# Patient Record
Sex: Female | Born: 1980
Health system: Southern US, Community
[De-identification: ages and names within clinical notes are randomized; demographics above are authoritative.]

## PROBLEM LIST (undated history)

## (undated) ENCOUNTER — Inpatient Hospital Stay (HOSPITAL_COMMUNITY): Payer: Self-pay

## (undated) DIAGNOSIS — O24419 Gestational diabetes mellitus in pregnancy, unspecified control: Secondary | ICD-10-CM

## (undated) DIAGNOSIS — J45909 Unspecified asthma, uncomplicated: Secondary | ICD-10-CM

## (undated) DIAGNOSIS — K219 Gastro-esophageal reflux disease without esophagitis: Secondary | ICD-10-CM

## (undated) HISTORY — PX: DILATION AND CURETTAGE OF UTERUS: SHX78

## (undated) HISTORY — PX: TONSILLECTOMY: SUR1361

---

## 2010-02-27 ENCOUNTER — Ambulatory Visit: Payer: Self-pay | Admitting: Internal Medicine

## 2010-03-07 ENCOUNTER — Emergency Department (HOSPITAL_COMMUNITY): Admission: EM | Admit: 2010-03-07 | Discharge: 2010-03-07 | Payer: Self-pay | Admitting: Emergency Medicine

## 2010-03-08 ENCOUNTER — Ambulatory Visit: Payer: Self-pay | Admitting: Obstetrics and Gynecology

## 2010-03-08 ENCOUNTER — Inpatient Hospital Stay (HOSPITAL_COMMUNITY): Admission: AD | Admit: 2010-03-08 | Discharge: 2010-03-08 | Payer: Self-pay | Admitting: Obstetrics & Gynecology

## 2010-03-11 ENCOUNTER — Inpatient Hospital Stay (HOSPITAL_COMMUNITY): Admission: AD | Admit: 2010-03-11 | Discharge: 2010-03-12 | Payer: Self-pay | Admitting: Obstetrics & Gynecology

## 2010-04-20 ENCOUNTER — Ambulatory Visit: Payer: Self-pay | Admitting: Obstetrics and Gynecology

## 2010-04-20 ENCOUNTER — Encounter (INDEPENDENT_AMBULATORY_CARE_PROVIDER_SITE_OTHER): Payer: Self-pay | Admitting: *Deleted

## 2010-04-20 LAB — CONVERTED CEMR LAB: hCG, Beta Chain, Quant, S: <2 milliintl units/mL

## 2010-06-13 ENCOUNTER — Emergency Department (HOSPITAL_COMMUNITY): Admission: EM | Admit: 2010-06-13 | Discharge: 2010-06-13 | Payer: Self-pay | Admitting: Family Medicine

## 2010-08-29 ENCOUNTER — Inpatient Hospital Stay (HOSPITAL_COMMUNITY)
Admission: AD | Admit: 2010-08-29 | Discharge: 2010-08-29 | Payer: Self-pay | Source: Home / Self Care | Attending: Obstetrics & Gynecology | Admitting: Obstetrics & Gynecology

## 2010-08-31 ENCOUNTER — Ambulatory Visit (HOSPITAL_COMMUNITY)
Admission: RE | Admit: 2010-08-31 | Discharge: 2010-08-31 | Payer: Self-pay | Source: Home / Self Care | Attending: Obstetrics & Gynecology | Admitting: Obstetrics & Gynecology

## 2010-09-07 ENCOUNTER — Inpatient Hospital Stay (HOSPITAL_COMMUNITY)
Admission: RE | Admit: 2010-09-07 | Discharge: 2010-09-07 | Payer: Self-pay | Source: Home / Self Care | Attending: Family Medicine | Admitting: Family Medicine

## 2010-09-20 ENCOUNTER — Ambulatory Visit (HOSPITAL_COMMUNITY)
Admission: RE | Admit: 2010-09-20 | Discharge: 2010-09-20 | Payer: Self-pay | Source: Home / Self Care | Attending: Family Medicine | Admitting: Family Medicine

## 2010-10-06 ENCOUNTER — Inpatient Hospital Stay (HOSPITAL_COMMUNITY)
Admission: AD | Admit: 2010-10-06 | Discharge: 2010-10-06 | Payer: Self-pay | Source: Home / Self Care | Attending: Obstetrics & Gynecology | Admitting: Obstetrics & Gynecology

## 2010-10-09 LAB — URINE CULTURE: Culture  Setup Time: 201201212032

## 2010-10-09 LAB — URINALYSIS, ROUTINE W REFLEX MICROSCOPIC
Bilirubin Urine: NEGATIVE
Specific Gravity, Urine: 1.025 (ref 1.005–1.030)
pH: 6.5 (ref 5.0–8.0)

## 2010-10-09 LAB — URINE MICROSCOPIC-ADD ON

## 2010-10-22 ENCOUNTER — Inpatient Hospital Stay (HOSPITAL_COMMUNITY)
Admission: AD | Admit: 2010-10-22 | Discharge: 2010-10-22 | Disposition: A | Discharge status: Home / Self Care | Payer: Medicaid Other | Source: Ambulatory Visit | Attending: Obstetrics & Gynecology | Admitting: Obstetrics & Gynecology

## 2010-10-22 DIAGNOSIS — O30009 Twin pregnancy, unspecified number of placenta and unspecified number of amniotic sacs, unspecified trimester: Secondary | ICD-10-CM | POA: Insufficient documentation

## 2010-10-22 DIAGNOSIS — O210 Mild hyperemesis gravidarum: Secondary | ICD-10-CM

## 2010-10-22 LAB — URINE MICROSCOPIC-ADD ON

## 2010-10-22 LAB — URINALYSIS, ROUTINE W REFLEX MICROSCOPIC
Bilirubin Urine: NEGATIVE
Nitrite: NEGATIVE
Specific Gravity, Urine: 1.02 (ref 1.005–1.030)
pH: 6.5 (ref 5.0–8.0)

## 2010-10-30 ENCOUNTER — Encounter: Payer: Self-pay | Admitting: Obstetrics & Gynecology

## 2010-10-30 ENCOUNTER — Ambulatory Visit: Payer: Medicaid Other

## 2010-10-30 DIAGNOSIS — O210 Mild hyperemesis gravidarum: Secondary | ICD-10-CM

## 2010-10-30 DIAGNOSIS — O30009 Twin pregnancy, unspecified number of placenta and unspecified number of amniotic sacs, unspecified trimester: Secondary | ICD-10-CM

## 2010-10-30 LAB — CONVERTED CEMR LAB
Antibody Screen: NEGATIVE
Basophils Relative: 0 % (ref 0–1)
Eosinophils Absolute: 0.1 10*3/uL (ref 0.0–0.7)
Eosinophils Relative: 2 % (ref 0–5)
HCT: 31.3 % — ABNORMAL LOW (ref 36.0–46.0)
HIV: NONREACTIVE
Hemoglobin: 10.7 g/dL — ABNORMAL LOW (ref 12.0–15.0)
Hgb A2 Quant: 2.8 % (ref 2.2–3.2)
Hgb F Quant: 0 % (ref 0.0–2.0)
Lymphs Abs: 1.2 10*3/uL (ref 0.7–4.0)
MCHC: 34.2 g/dL (ref 30.0–36.0)
MCV: 89.9 fL (ref 78.0–100.0)
Monocytes Absolute: 0.4 10*3/uL (ref 0.1–1.0)
Monocytes Relative: 7 % (ref 3–12)
RBC: 3.48 M/uL — ABNORMAL LOW (ref 3.87–5.11)
Rh Type: POSITIVE
WBC: 5.3 10*3/uL (ref 4.0–10.5)

## 2010-10-31 ENCOUNTER — Encounter: Payer: Self-pay | Admitting: Obstetrics and Gynecology

## 2010-10-31 ENCOUNTER — Other Ambulatory Visit: Payer: Self-pay

## 2010-10-31 ENCOUNTER — Other Ambulatory Visit: Payer: Self-pay | Admitting: Obstetrics and Gynecology

## 2010-10-31 DIAGNOSIS — O210 Mild hyperemesis gravidarum: Secondary | ICD-10-CM

## 2010-10-31 DIAGNOSIS — O30009 Twin pregnancy, unspecified number of placenta and unspecified number of amniotic sacs, unspecified trimester: Secondary | ICD-10-CM

## 2010-10-31 LAB — POCT URINALYSIS DIPSTICK
Nitrite: NEGATIVE
Protein, ur: 30 mg/dL — AB
Specific Gravity, Urine: 1.02 (ref 1.005–1.030)
Urine Glucose, Fasting: NEGATIVE mg/dL
Urobilinogen, UA: 0.2 mg/dL (ref 0.0–1.0)
pH: 6.5 (ref 5.0–8.0)

## 2010-10-31 LAB — CONVERTED CEMR LAB: Trich, Wet Prep: NONE SEEN

## 2010-11-06 ENCOUNTER — Ambulatory Visit: Payer: Medicaid Other

## 2010-11-07 ENCOUNTER — Ambulatory Visit: Payer: Medicaid Other

## 2010-11-07 ENCOUNTER — Encounter: Payer: Self-pay | Admitting: Obstetrics & Gynecology

## 2010-11-07 DIAGNOSIS — Z0189 Encounter for other specified special examinations: Secondary | ICD-10-CM

## 2010-11-15 ENCOUNTER — Other Ambulatory Visit: Payer: Self-pay

## 2010-11-15 DIAGNOSIS — O30009 Twin pregnancy, unspecified number of placenta and unspecified number of amniotic sacs, unspecified trimester: Secondary | ICD-10-CM

## 2010-11-15 DIAGNOSIS — O210 Mild hyperemesis gravidarum: Secondary | ICD-10-CM

## 2010-11-15 LAB — POCT URINALYSIS DIPSTICK
Nitrite: NEGATIVE
Protein, ur: NEGATIVE mg/dL
pH: 6 (ref 5.0–8.0)

## 2010-11-16 ENCOUNTER — Encounter (INDEPENDENT_AMBULATORY_CARE_PROVIDER_SITE_OTHER): Payer: Self-pay | Admitting: *Deleted

## 2010-11-27 LAB — URINALYSIS, ROUTINE W REFLEX MICROSCOPIC
Glucose, UA: NEGATIVE mg/dL
Ketones, ur: NEGATIVE mg/dL
Leukocytes, UA: NEGATIVE
pH: 5.5 (ref 5.0–8.0)

## 2010-11-27 LAB — URINE MICROSCOPIC-ADD ON

## 2010-11-27 LAB — POCT PREGNANCY, URINE: Preg Test, Ur: POSITIVE

## 2010-11-27 LAB — WET PREP, GENITAL
Trich, Wet Prep: NONE SEEN
Yeast Wet Prep HPF POC: NONE SEEN

## 2010-11-27 LAB — ABO/RH: ABO/RH(D): B POS

## 2010-11-27 LAB — CBC
Platelets: 231 10*3/uL (ref 150–400)
RBC: 3.72 MIL/uL — ABNORMAL LOW (ref 3.87–5.11)
RDW: 12.8 % (ref 11.5–15.5)
WBC: 6.4 10*3/uL (ref 4.0–10.5)

## 2010-11-27 LAB — GC/CHLAMYDIA PROBE AMP, GENITAL: GC Probe Amp, Genital: NEGATIVE

## 2010-11-29 ENCOUNTER — Encounter: Payer: Self-pay | Admitting: *Deleted

## 2010-11-29 ENCOUNTER — Other Ambulatory Visit: Payer: Self-pay

## 2010-11-29 ENCOUNTER — Other Ambulatory Visit: Payer: Self-pay | Admitting: Obstetrics and Gynecology

## 2010-11-29 DIAGNOSIS — O210 Mild hyperemesis gravidarum: Secondary | ICD-10-CM

## 2010-11-29 DIAGNOSIS — IMO0001 Reserved for inherently not codable concepts without codable children: Secondary | ICD-10-CM

## 2010-11-29 DIAGNOSIS — O30009 Twin pregnancy, unspecified number of placenta and unspecified number of amniotic sacs, unspecified trimester: Secondary | ICD-10-CM

## 2010-11-29 DIAGNOSIS — O24419 Gestational diabetes mellitus in pregnancy, unspecified control: Secondary | ICD-10-CM

## 2010-11-29 LAB — POCT URINALYSIS DIPSTICK
Ketones, ur: NEGATIVE mg/dL
Protein, ur: NEGATIVE mg/dL
pH: 6 (ref 5.0–8.0)

## 2010-12-02 LAB — GC/CHLAMYDIA PROBE AMP, GENITAL
Chlamydia, DNA Probe: NEGATIVE
GC Probe Amp, Genital: NEGATIVE

## 2010-12-02 LAB — CBC
HCT: 39.8 % (ref 36.0–46.0)
Hemoglobin: 11.2 g/dL — ABNORMAL LOW (ref 12.0–15.0)
Hemoglobin: 13.5 g/dL (ref 12.0–15.0)
MCH: 32.8 pg (ref 26.0–34.0)
MCH: 33.2 pg (ref 26.0–34.0)
MCHC: 34.8 g/dL (ref 30.0–36.0)
MCHC: 35.4 g/dL (ref 30.0–36.0)
MCV: 93.9 fL (ref 78.0–100.0)
MCV: 94.6 fL (ref 78.0–100.0)
Platelets: 213 10*3/uL (ref 150–400)
RBC: 4.01 MIL/uL (ref 3.87–5.11)
RBC: 4.21 MIL/uL (ref 3.87–5.11)
RDW: 12.5 % (ref 11.5–15.5)
RDW: 12.6 % (ref 11.5–15.5)
WBC: 7.4 10*3/uL (ref 4.0–10.5)

## 2010-12-02 LAB — URINALYSIS, ROUTINE W REFLEX MICROSCOPIC
Bilirubin Urine: NEGATIVE
Glucose, UA: NEGATIVE mg/dL
Specific Gravity, Urine: 1.008 (ref 1.005–1.030)
Urobilinogen, UA: 0.2 mg/dL (ref 0.0–1.0)

## 2010-12-02 LAB — DIFFERENTIAL
Basophils Absolute: 0 10*3/uL (ref 0.0–0.1)
Eosinophils Relative: 3 % (ref 0–5)
Lymphocytes Relative: 34 % (ref 12–46)
Lymphs Abs: 2.5 10*3/uL (ref 0.7–4.0)
Monocytes Absolute: 0.4 10*3/uL (ref 0.1–1.0)
Monocytes Relative: 6 % (ref 3–12)
Neutro Abs: 4.2 10*3/uL (ref 1.7–7.7)

## 2010-12-02 LAB — RPR: RPR Ser Ql: NONREACTIVE

## 2010-12-02 LAB — WET PREP, GENITAL
Trich, Wet Prep: NONE SEEN
Yeast Wet Prep HPF POC: NONE SEEN

## 2010-12-02 LAB — URINE MICROSCOPIC-ADD ON

## 2010-12-02 LAB — HCG, QUANTITATIVE, PREGNANCY: hCG, Beta Chain, Quant, S: 3713 m[IU]/mL — ABNORMAL HIGH (ref <5)

## 2010-12-11 ENCOUNTER — Other Ambulatory Visit: Payer: Self-pay | Admitting: Obstetrics and Gynecology

## 2010-12-11 DIAGNOSIS — IMO0001 Reserved for inherently not codable concepts without codable children: Secondary | ICD-10-CM

## 2010-12-11 DIAGNOSIS — O210 Mild hyperemesis gravidarum: Secondary | ICD-10-CM

## 2010-12-11 DIAGNOSIS — O24419 Gestational diabetes mellitus in pregnancy, unspecified control: Secondary | ICD-10-CM

## 2010-12-13 ENCOUNTER — Encounter (HOSPITAL_COMMUNITY): Payer: Self-pay

## 2010-12-13 ENCOUNTER — Ambulatory Visit (HOSPITAL_COMMUNITY)
Admission: RE | Admit: 2010-12-13 | Discharge: 2010-12-13 | Disposition: A | Discharge status: Home / Self Care | Payer: Medicaid Other | Source: Ambulatory Visit | Attending: Obstetrics and Gynecology | Admitting: Obstetrics and Gynecology

## 2010-12-13 DIAGNOSIS — O210 Mild hyperemesis gravidarum: Secondary | ICD-10-CM

## 2010-12-13 DIAGNOSIS — O30009 Twin pregnancy, unspecified number of placenta and unspecified number of amniotic sacs, unspecified trimester: Secondary | ICD-10-CM | POA: Insufficient documentation

## 2010-12-13 DIAGNOSIS — O358XX0 Maternal care for other (suspected) fetal abnormality and damage, not applicable or unspecified: Secondary | ICD-10-CM | POA: Insufficient documentation

## 2010-12-13 DIAGNOSIS — IMO0001 Reserved for inherently not codable concepts without codable children: Secondary | ICD-10-CM

## 2010-12-13 DIAGNOSIS — Z363 Encounter for antenatal screening for malformations: Secondary | ICD-10-CM | POA: Insufficient documentation

## 2010-12-13 DIAGNOSIS — O24419 Gestational diabetes mellitus in pregnancy, unspecified control: Secondary | ICD-10-CM

## 2010-12-13 DIAGNOSIS — Z1389 Encounter for screening for other disorder: Secondary | ICD-10-CM | POA: Insufficient documentation

## 2010-12-20 ENCOUNTER — Other Ambulatory Visit: Payer: Self-pay | Admitting: Obstetrics & Gynecology

## 2010-12-20 DIAGNOSIS — O210 Mild hyperemesis gravidarum: Secondary | ICD-10-CM

## 2010-12-20 DIAGNOSIS — O30009 Twin pregnancy, unspecified number of placenta and unspecified number of amniotic sacs, unspecified trimester: Secondary | ICD-10-CM

## 2010-12-20 DIAGNOSIS — Z331 Pregnant state, incidental: Secondary | ICD-10-CM

## 2010-12-20 LAB — POCT URINALYSIS DIP (DEVICE)
Glucose, UA: NEGATIVE mg/dL
Nitrite: NEGATIVE
Urobilinogen, UA: 0.2 mg/dL (ref 0.0–1.0)
pH: 6.5 (ref 5.0–8.0)

## 2011-01-03 ENCOUNTER — Other Ambulatory Visit: Payer: Self-pay | Admitting: Obstetrics & Gynecology

## 2011-01-03 DIAGNOSIS — O30009 Twin pregnancy, unspecified number of placenta and unspecified number of amniotic sacs, unspecified trimester: Secondary | ICD-10-CM

## 2011-01-03 DIAGNOSIS — O210 Mild hyperemesis gravidarum: Secondary | ICD-10-CM

## 2011-01-03 DIAGNOSIS — Z331 Pregnant state, incidental: Secondary | ICD-10-CM

## 2011-01-03 LAB — POCT URINALYSIS DIP (DEVICE)
Nitrite: NEGATIVE
Protein, ur: NEGATIVE mg/dL
pH: 6 (ref 5.0–8.0)

## 2011-01-11 ENCOUNTER — Inpatient Hospital Stay (HOSPITAL_COMMUNITY)
Admission: AD | Admit: 2011-01-11 | Discharge: 2011-01-11 | Disposition: A | Discharge status: Home / Self Care | Payer: Medicaid Other | Source: Ambulatory Visit | Attending: Obstetrics and Gynecology | Admitting: Obstetrics and Gynecology

## 2011-01-11 ENCOUNTER — Inpatient Hospital Stay (HOSPITAL_COMMUNITY): Payer: Medicaid Other

## 2011-01-11 DIAGNOSIS — B373 Candidiasis of vulva and vagina: Secondary | ICD-10-CM

## 2011-01-11 DIAGNOSIS — B3731 Acute candidiasis of vulva and vagina: Secondary | ICD-10-CM | POA: Insufficient documentation

## 2011-01-11 DIAGNOSIS — O2390 Unspecified genitourinary tract infection in pregnancy, unspecified trimester: Secondary | ICD-10-CM

## 2011-01-11 DIAGNOSIS — R109 Unspecified abdominal pain: Secondary | ICD-10-CM | POA: Insufficient documentation

## 2011-01-11 DIAGNOSIS — K5900 Constipation, unspecified: Secondary | ICD-10-CM | POA: Insufficient documentation

## 2011-01-11 LAB — URINALYSIS, ROUTINE W REFLEX MICROSCOPIC
Bilirubin Urine: NEGATIVE
Ketones, ur: NEGATIVE mg/dL
Nitrite: NEGATIVE
Protein, ur: NEGATIVE mg/dL
Urobilinogen, UA: 1 mg/dL (ref 0.0–1.0)

## 2011-01-12 LAB — GC/CHLAMYDIA PROBE AMP, GENITAL: Chlamydia, DNA Probe: NEGATIVE

## 2011-01-13 LAB — URINE CULTURE
Colony Count: NO GROWTH
Culture: NO GROWTH

## 2011-01-17 ENCOUNTER — Ambulatory Visit (HOSPITAL_COMMUNITY)
Admission: RE | Admit: 2011-01-17 | Discharge: 2011-01-17 | Disposition: A | Discharge status: Home / Self Care | Payer: Medicaid Other | Source: Ambulatory Visit | Attending: Obstetrics & Gynecology | Admitting: Obstetrics & Gynecology

## 2011-01-17 ENCOUNTER — Ambulatory Visit (HOSPITAL_COMMUNITY): Payer: Medicaid Other

## 2011-01-17 ENCOUNTER — Other Ambulatory Visit: Payer: Self-pay | Admitting: Obstetrics and Gynecology

## 2011-01-17 DIAGNOSIS — O210 Mild hyperemesis gravidarum: Secondary | ICD-10-CM

## 2011-01-17 DIAGNOSIS — O30049 Twin pregnancy, dichorionic/diamniotic, unspecified trimester: Secondary | ICD-10-CM | POA: Insufficient documentation

## 2011-01-17 DIAGNOSIS — Z331 Pregnant state, incidental: Secondary | ICD-10-CM

## 2011-01-17 DIAGNOSIS — O30009 Twin pregnancy, unspecified number of placenta and unspecified number of amniotic sacs, unspecified trimester: Secondary | ICD-10-CM | POA: Insufficient documentation

## 2011-01-17 LAB — POCT URINALYSIS DIP (DEVICE)
Bilirubin Urine: NEGATIVE
Ketones, ur: NEGATIVE mg/dL
Nitrite: NEGATIVE
pH: 6 (ref 5.0–8.0)

## 2011-01-31 ENCOUNTER — Other Ambulatory Visit: Payer: Self-pay | Admitting: Obstetrics & Gynecology

## 2011-01-31 ENCOUNTER — Other Ambulatory Visit: Payer: Medicaid Other

## 2011-01-31 DIAGNOSIS — O210 Mild hyperemesis gravidarum: Secondary | ICD-10-CM

## 2011-01-31 DIAGNOSIS — O30009 Twin pregnancy, unspecified number of placenta and unspecified number of amniotic sacs, unspecified trimester: Secondary | ICD-10-CM

## 2011-01-31 DIAGNOSIS — Z331 Pregnant state, incidental: Secondary | ICD-10-CM

## 2011-01-31 DIAGNOSIS — IMO0001 Reserved for inherently not codable concepts without codable children: Secondary | ICD-10-CM

## 2011-01-31 LAB — POCT URINALYSIS DIP (DEVICE)
Glucose, UA: NEGATIVE mg/dL
Ketones, ur: NEGATIVE mg/dL
Protein, ur: NEGATIVE mg/dL
Specific Gravity, Urine: 1.02 (ref 1.005–1.030)
Urobilinogen, UA: 0.2 mg/dL (ref 0.0–1.0)

## 2011-02-05 DIAGNOSIS — Z0189 Encounter for other specified special examinations: Secondary | ICD-10-CM

## 2011-02-14 ENCOUNTER — Ambulatory Visit (HOSPITAL_COMMUNITY)
Admission: RE | Admit: 2011-02-14 | Discharge: 2011-02-14 | Disposition: A | Discharge status: Home / Self Care | Payer: Medicaid Other | Source: Ambulatory Visit | Attending: Obstetrics & Gynecology | Admitting: Obstetrics & Gynecology

## 2011-02-14 ENCOUNTER — Ambulatory Visit (HOSPITAL_COMMUNITY): Payer: Medicaid Other

## 2011-02-14 ENCOUNTER — Other Ambulatory Visit: Payer: Self-pay | Admitting: Obstetrics and Gynecology

## 2011-02-14 DIAGNOSIS — Z3689 Encounter for other specified antenatal screening: Secondary | ICD-10-CM | POA: Insufficient documentation

## 2011-02-14 DIAGNOSIS — O30009 Twin pregnancy, unspecified number of placenta and unspecified number of amniotic sacs, unspecified trimester: Secondary | ICD-10-CM

## 2011-02-14 DIAGNOSIS — IMO0001 Reserved for inherently not codable concepts without codable children: Secondary | ICD-10-CM

## 2011-02-14 DIAGNOSIS — O210 Mild hyperemesis gravidarum: Secondary | ICD-10-CM

## 2011-02-14 DIAGNOSIS — Z331 Pregnant state, incidental: Secondary | ICD-10-CM

## 2011-02-14 LAB — POCT URINALYSIS DIP (DEVICE)
Bilirubin Urine: NEGATIVE
Glucose, UA: NEGATIVE mg/dL
Hgb urine dipstick: NEGATIVE
Nitrite: NEGATIVE
Specific Gravity, Urine: 1.025 (ref 1.005–1.030)
Urobilinogen, UA: 2 mg/dL — ABNORMAL HIGH (ref 0.0–1.0)

## 2011-02-18 ENCOUNTER — Encounter: Payer: Medicaid Other | Attending: Obstetrics & Gynecology | Admitting: Dietician

## 2011-02-18 ENCOUNTER — Other Ambulatory Visit: Payer: Self-pay | Admitting: Obstetrics & Gynecology

## 2011-02-18 DIAGNOSIS — Z0189 Encounter for other specified special examinations: Secondary | ICD-10-CM

## 2011-02-18 LAB — POCT URINALYSIS DIP (DEVICE)
Bilirubin Urine: NEGATIVE
Glucose, UA: NEGATIVE mg/dL
Ketones, ur: NEGATIVE mg/dL
Nitrite: NEGATIVE

## 2011-02-25 ENCOUNTER — Other Ambulatory Visit: Payer: Self-pay | Admitting: Obstetrics & Gynecology

## 2011-02-25 DIAGNOSIS — O99810 Abnormal glucose complicating pregnancy: Secondary | ICD-10-CM

## 2011-02-25 DIAGNOSIS — O210 Mild hyperemesis gravidarum: Secondary | ICD-10-CM

## 2011-02-25 DIAGNOSIS — O30009 Twin pregnancy, unspecified number of placenta and unspecified number of amniotic sacs, unspecified trimester: Secondary | ICD-10-CM

## 2011-02-25 LAB — POCT URINALYSIS DIP (DEVICE)
Bilirubin Urine: NEGATIVE
Ketones, ur: NEGATIVE mg/dL
Specific Gravity, Urine: 1.015 (ref 1.005–1.030)

## 2011-03-11 ENCOUNTER — Other Ambulatory Visit: Payer: Self-pay | Admitting: Family Medicine

## 2011-03-11 DIAGNOSIS — O24919 Unspecified diabetes mellitus in pregnancy, unspecified trimester: Secondary | ICD-10-CM

## 2011-03-11 DIAGNOSIS — O99810 Abnormal glucose complicating pregnancy: Secondary | ICD-10-CM

## 2011-03-11 DIAGNOSIS — O30009 Twin pregnancy, unspecified number of placenta and unspecified number of amniotic sacs, unspecified trimester: Secondary | ICD-10-CM

## 2011-03-11 DIAGNOSIS — IMO0001 Reserved for inherently not codable concepts without codable children: Secondary | ICD-10-CM

## 2011-03-11 LAB — POCT URINALYSIS DIP (DEVICE)
Bilirubin Urine: NEGATIVE
Glucose, UA: NEGATIVE mg/dL
Hgb urine dipstick: NEGATIVE
Ketones, ur: NEGATIVE mg/dL
Specific Gravity, Urine: 1.015 (ref 1.005–1.030)
Urobilinogen, UA: 1 mg/dL (ref 0.0–1.0)

## 2011-03-18 ENCOUNTER — Other Ambulatory Visit: Payer: Self-pay | Admitting: Obstetrics and Gynecology

## 2011-03-18 ENCOUNTER — Ambulatory Visit (HOSPITAL_COMMUNITY)
Admission: RE | Admit: 2011-03-18 | Discharge: 2011-03-18 | Disposition: A | Discharge status: Home / Self Care | Payer: Medicaid Other | Source: Ambulatory Visit | Attending: Family Medicine | Admitting: Family Medicine

## 2011-03-18 ENCOUNTER — Ambulatory Visit (HOSPITAL_COMMUNITY): Payer: Medicaid Other

## 2011-03-18 DIAGNOSIS — IMO0001 Reserved for inherently not codable concepts without codable children: Secondary | ICD-10-CM

## 2011-03-18 DIAGNOSIS — O24919 Unspecified diabetes mellitus in pregnancy, unspecified trimester: Secondary | ICD-10-CM

## 2011-03-18 DIAGNOSIS — O210 Mild hyperemesis gravidarum: Secondary | ICD-10-CM

## 2011-03-18 DIAGNOSIS — O30009 Twin pregnancy, unspecified number of placenta and unspecified number of amniotic sacs, unspecified trimester: Secondary | ICD-10-CM | POA: Insufficient documentation

## 2011-03-18 DIAGNOSIS — O99810 Abnormal glucose complicating pregnancy: Secondary | ICD-10-CM | POA: Insufficient documentation

## 2011-03-18 LAB — POCT URINALYSIS DIP (DEVICE)
Bilirubin Urine: NEGATIVE
Glucose, UA: NEGATIVE mg/dL
Ketones, ur: NEGATIVE mg/dL
Nitrite: NEGATIVE
Specific Gravity, Urine: 1.02 (ref 1.005–1.030)
pH: 7 (ref 5.0–8.0)

## 2011-03-22 ENCOUNTER — Other Ambulatory Visit: Payer: Medicaid Other

## 2011-03-22 DIAGNOSIS — O30009 Twin pregnancy, unspecified number of placenta and unspecified number of amniotic sacs, unspecified trimester: Secondary | ICD-10-CM

## 2011-03-22 DIAGNOSIS — O99810 Abnormal glucose complicating pregnancy: Secondary | ICD-10-CM

## 2011-03-25 ENCOUNTER — Ambulatory Visit: Payer: Medicaid Other | Admitting: Family Medicine

## 2011-03-25 ENCOUNTER — Other Ambulatory Visit: Payer: Self-pay | Admitting: Obstetrics and Gynecology

## 2011-03-25 ENCOUNTER — Other Ambulatory Visit: Payer: Self-pay | Admitting: Obstetrics & Gynecology

## 2011-03-25 DIAGNOSIS — O30009 Twin pregnancy, unspecified number of placenta and unspecified number of amniotic sacs, unspecified trimester: Secondary | ICD-10-CM

## 2011-03-25 DIAGNOSIS — O24919 Unspecified diabetes mellitus in pregnancy, unspecified trimester: Secondary | ICD-10-CM

## 2011-03-25 DIAGNOSIS — O210 Mild hyperemesis gravidarum: Secondary | ICD-10-CM

## 2011-03-25 DIAGNOSIS — O24419 Gestational diabetes mellitus in pregnancy, unspecified control: Secondary | ICD-10-CM

## 2011-03-25 LAB — POCT URINALYSIS DIP (DEVICE)
Leukocytes, UA: NEGATIVE
Specific Gravity, Urine: 1.02 (ref 1.005–1.030)
pH: 6.5 (ref 5.0–8.0)

## 2011-03-28 ENCOUNTER — Telehealth: Payer: Self-pay | Admitting: Obstetrics & Gynecology

## 2011-03-28 LAB — CULTURE, OB URINE

## 2011-03-28 MED ORDER — NITROFURANTOIN MONOHYD MACRO 100 MG PO CAPS: 100.0000 mg | ORAL_CAPSULE | Freq: Two times a day (BID) | ORAL | Status: AC

## 2011-03-28 NOTE — Telephone Encounter (Signed)
Script printed.

## 2011-03-29 ENCOUNTER — Ambulatory Visit (INDEPENDENT_AMBULATORY_CARE_PROVIDER_SITE_OTHER): Payer: Medicaid Other | Admitting: Family Medicine

## 2011-03-29 DIAGNOSIS — O24919 Unspecified diabetes mellitus in pregnancy, unspecified trimester: Secondary | ICD-10-CM

## 2011-03-29 DIAGNOSIS — O99810 Abnormal glucose complicating pregnancy: Secondary | ICD-10-CM

## 2011-03-29 DIAGNOSIS — O30009 Twin pregnancy, unspecified number of placenta and unspecified number of amniotic sacs, unspecified trimester: Secondary | ICD-10-CM

## 2011-04-01 ENCOUNTER — Ambulatory Visit (INDEPENDENT_AMBULATORY_CARE_PROVIDER_SITE_OTHER): Payer: Medicaid Other | Admitting: Family Medicine

## 2011-04-01 DIAGNOSIS — O30009 Twin pregnancy, unspecified number of placenta and unspecified number of amniotic sacs, unspecified trimester: Secondary | ICD-10-CM

## 2011-04-01 DIAGNOSIS — O99810 Abnormal glucose complicating pregnancy: Secondary | ICD-10-CM

## 2011-04-01 LAB — POCT URINALYSIS DIP (DEVICE)
Bilirubin Urine: NEGATIVE
Glucose, UA: NEGATIVE mg/dL
Nitrite: NEGATIVE
Specific Gravity, Urine: 1.02 (ref 1.005–1.030)
Urobilinogen, UA: 0.2 mg/dL (ref 0.0–1.0)

## 2011-04-05 ENCOUNTER — Ambulatory Visit: Payer: Medicaid Other | Admitting: *Deleted

## 2011-04-05 DIAGNOSIS — O30009 Twin pregnancy, unspecified number of placenta and unspecified number of amniotic sacs, unspecified trimester: Secondary | ICD-10-CM

## 2011-04-08 ENCOUNTER — Other Ambulatory Visit: Payer: Self-pay | Admitting: Obstetrics and Gynecology

## 2011-04-08 ENCOUNTER — Ambulatory Visit (HOSPITAL_COMMUNITY)
Admission: RE | Admit: 2011-04-08 | Discharge: 2011-04-08 | Disposition: A | Discharge status: Home / Self Care | Payer: Medicaid Other | Source: Ambulatory Visit | Attending: Obstetrics and Gynecology | Admitting: Obstetrics and Gynecology

## 2011-04-08 ENCOUNTER — Ambulatory Visit: Payer: Medicaid Other | Admitting: *Deleted

## 2011-04-08 ENCOUNTER — Other Ambulatory Visit: Payer: Self-pay | Admitting: Obstetrics & Gynecology

## 2011-04-08 DIAGNOSIS — IMO0001 Reserved for inherently not codable concepts without codable children: Secondary | ICD-10-CM

## 2011-04-08 DIAGNOSIS — O288 Other abnormal findings on antenatal screening of mother: Secondary | ICD-10-CM

## 2011-04-08 DIAGNOSIS — O24419 Gestational diabetes mellitus in pregnancy, unspecified control: Secondary | ICD-10-CM

## 2011-04-08 DIAGNOSIS — O99810 Abnormal glucose complicating pregnancy: Secondary | ICD-10-CM

## 2011-04-08 DIAGNOSIS — O30009 Twin pregnancy, unspecified number of placenta and unspecified number of amniotic sacs, unspecified trimester: Secondary | ICD-10-CM | POA: Insufficient documentation

## 2011-04-08 LAB — POCT URINALYSIS DIP (DEVICE)
Bilirubin Urine: NEGATIVE
Glucose, UA: NEGATIVE mg/dL
Ketones, ur: NEGATIVE mg/dL
Nitrite: NEGATIVE

## 2011-04-09 LAB — GC/CHLAMYDIA PROBE AMP, GENITAL: Chlamydia, DNA Probe: NEGATIVE

## 2011-04-10 LAB — CULTURE, OB URINE
Colony Count: NO GROWTH
Organism ID, Bacteria: NO GROWTH

## 2011-04-12 ENCOUNTER — Encounter (HOSPITAL_COMMUNITY): Payer: Self-pay

## 2011-04-12 ENCOUNTER — Inpatient Hospital Stay (HOSPITAL_COMMUNITY)
Admission: AD | Admit: 2011-04-12 | Discharge: 2011-04-13 | Disposition: A | Discharge status: Home / Self Care | Payer: Medicaid Other | Source: Ambulatory Visit | Attending: Obstetrics & Gynecology | Admitting: Obstetrics & Gynecology

## 2011-04-12 ENCOUNTER — Ambulatory Visit: Payer: Medicaid Other | Admitting: *Deleted

## 2011-04-12 DIAGNOSIS — O30009 Twin pregnancy, unspecified number of placenta and unspecified number of amniotic sacs, unspecified trimester: Secondary | ICD-10-CM

## 2011-04-12 DIAGNOSIS — O479 False labor, unspecified: Secondary | ICD-10-CM | POA: Insufficient documentation

## 2011-04-12 DIAGNOSIS — O99810 Abnormal glucose complicating pregnancy: Secondary | ICD-10-CM

## 2011-04-12 MED ORDER — TERBUTALINE SULFATE 1 MG/ML IJ SOLN
0.2500 mg | Freq: Once | INTRAMUSCULAR | Status: AC
  Administered 2011-04-13: via SUBCUTANEOUS
  Filled 2011-04-12: qty 1

## 2011-04-12 NOTE — Progress Notes (Signed)
Pt states," I have a lot of pain in my low abd and it goes around to my back. It started at 2:00 pm and has gotten worse and now it is every 2-5 min."

## 2011-04-12 NOTE — Progress Notes (Signed)
Patient is here for labor eval. She states that she has been having painful ctx since 1400pm. She denies any vaginal bleeding or lof. Reports good fetal movement.

## 2011-04-15 ENCOUNTER — Ambulatory Visit: Payer: Medicaid Other | Admitting: *Deleted

## 2011-04-15 DIAGNOSIS — O30009 Twin pregnancy, unspecified number of placenta and unspecified number of amniotic sacs, unspecified trimester: Secondary | ICD-10-CM

## 2011-04-15 DIAGNOSIS — O99810 Abnormal glucose complicating pregnancy: Secondary | ICD-10-CM

## 2011-04-15 LAB — POCT URINALYSIS DIP (DEVICE)
Bilirubin Urine: NEGATIVE
Ketones, ur: NEGATIVE mg/dL
Protein, ur: NEGATIVE mg/dL
pH: 7 (ref 5.0–8.0)

## 2011-04-19 ENCOUNTER — Ambulatory Visit (EMERGENCY_DEPARTMENT_HOSPITAL): Payer: Medicaid Other | Admitting: *Deleted

## 2011-04-19 DIAGNOSIS — O210 Mild hyperemesis gravidarum: Secondary | ICD-10-CM

## 2011-04-19 DIAGNOSIS — O30009 Twin pregnancy, unspecified number of placenta and unspecified number of amniotic sacs, unspecified trimester: Secondary | ICD-10-CM

## 2011-04-19 DIAGNOSIS — O99810 Abnormal glucose complicating pregnancy: Secondary | ICD-10-CM

## 2011-04-19 NOTE — Progress Notes (Signed)
I have reviewed this category I tracing 

## 2011-04-22 ENCOUNTER — Encounter (HOSPITAL_COMMUNITY): Payer: Self-pay | Admitting: *Deleted

## 2011-04-22 ENCOUNTER — Ambulatory Visit (INDEPENDENT_AMBULATORY_CARE_PROVIDER_SITE_OTHER): Payer: Medicaid Other | Admitting: Family Medicine

## 2011-04-22 ENCOUNTER — Other Ambulatory Visit: Payer: Self-pay | Admitting: Obstetrics and Gynecology

## 2011-04-22 ENCOUNTER — Inpatient Hospital Stay (HOSPITAL_COMMUNITY)
Admission: AD | Admit: 2011-04-22 | Discharge: 2011-04-22 | Disposition: A | Discharge status: Home / Self Care | Payer: Medicaid Other | Source: Ambulatory Visit | Attending: Obstetrics & Gynecology | Admitting: Obstetrics & Gynecology

## 2011-04-22 DIAGNOSIS — O99810 Abnormal glucose complicating pregnancy: Secondary | ICD-10-CM

## 2011-04-22 DIAGNOSIS — O30009 Twin pregnancy, unspecified number of placenta and unspecified number of amniotic sacs, unspecified trimester: Secondary | ICD-10-CM | POA: Insufficient documentation

## 2011-04-22 DIAGNOSIS — O479 False labor, unspecified: Secondary | ICD-10-CM | POA: Insufficient documentation

## 2011-04-22 LAB — POCT URINALYSIS DIP (DEVICE)
Ketones, ur: NEGATIVE mg/dL
Protein, ur: NEGATIVE mg/dL
Specific Gravity, Urine: 1.02 (ref 1.005–1.030)
Urobilinogen, UA: 0.2 mg/dL (ref 0.0–1.0)
pH: 6.5 (ref 5.0–8.0)

## 2011-04-22 MED ORDER — OXYCODONE-ACETAMINOPHEN 5-325 MG PO TABS: 2.0000 | ORAL_TABLET | Freq: Once | ORAL | Status: DC

## 2011-04-22 MED ORDER — HYDROMORPHONE HCL 1 MG/ML IJ SOLN
1.0000 mg | Freq: Once | INTRAMUSCULAR | Status: AC
  Administered 2011-04-22: 1 mg via INTRAMUSCULAR
  Filled 2011-04-22: qty 1

## 2011-04-22 MED ORDER — OXYCODONE-ACETAMINOPHEN 5-325 MG PO TABS: 1.0000 | ORAL_TABLET | ORAL | Status: DC | PRN

## 2011-04-22 MED ORDER — PROMETHAZINE HCL 25 MG/ML IJ SOLN
12.5000 mg | Freq: Once | INTRAMUSCULAR | Status: AC
  Administered 2011-04-22: 12.5 mg via INTRAMUSCULAR
  Filled 2011-04-22: qty 1

## 2011-04-22 NOTE — ED Provider Notes (Signed)
Pt cx remains unchanged...1-2/50/-3.  Plan discussed with the patient with interpreter and agrees with d/c home.  C/S on Fri.

## 2011-04-22 NOTE — ED Notes (Signed)
Pt. very restless, difficult to monitor. Will not stay in position conducive to monitoring. Explained to pt. importance of monitoring especially after pain med.

## 2011-04-22 NOTE — Progress Notes (Signed)
NST reviewed and reactive x 2. UC's regular.

## 2011-04-22 NOTE — Progress Notes (Signed)
Pt cervical exam is unchanged after .  Fetal tracing is reactive with minimal contractions.  Pt feeling considerable back and lower abdominal/pelvic pain.  Will try giving some pain medication by mouth and monitor for response.  Pt discussed with Dr. Debroah Loop

## 2011-04-22 NOTE — ED Provider Notes (Signed)
Sylvia Best is a 30 y.o. female presenting for contractions.  She was seen in the clinic this morning and felt to be having regular contractions so was sent to MAU.  No blood/fluid per vagina, pt is feeling contractions but doesn't really know how often.  Good fetal movement.  Pt does report a small amount of epigastric pain that is constant and has been present for the last 30 min.  She has a known Di-Di twin gestation. Last [place] was 04/08/11 and was notable for significant lag in fetal growth (about 3 weeks behind GA) but no abnormalities in anatomy.  Both babies were transverse at the time.  She has GDM and is on Glyburide 2.5mg  daily and has fasting BGs in the 70s-90s.  History OB History    Grav Para Term Preterm Abortions TAB SAB Ect Mult Living   3    2  2    0     History reviewed. No pertinent past medical history. Past Surgical History  Procedure Date  . Tonsillectomy      Family History: family history includes Diabetes in her father and paternal grandfather; Heart disease in her father; and Hypertension in her mother. Social History:  reports that she has never smoked. She has never used smokeless tobacco. She reports that she does not drink alcohol or use illicit drugs.  ROS Negative except per HPI. Dilation: 1.5 Effacement (%): 50 Station: -2 Exam by:: Dr. Louanne Belton Blood pressure 123/83, pulse 76, temperature 98.3 F (36.8 C), temperature source Oral, resp. rate 16, height 5\' 2"  (1.575 m), weight 155 lb 2 oz (70.364 kg), last menstrual period 08/03/2010, SpO2 100.00%. Exam Physical Exam  Gen: Having intermittent contractions but otherwise NAD CV: RRR Resp: CTABL Abd: Gravid but otherwise WNL Ext: 1+ edema to mid-calf, pt says this is stable for the last month.  FHR: A: Moderate variability, 10x10s, no decels, baseline 145 B: Moderate variability, 10x10s, no decels, baseline 140 Ctx:Q3-42min  Prenatal labs: ABO, Rh: B POS (12/14 1000) Antibody: NEG (02/14  1825) Rubella:Immune   RPR: NON REAC (02/14 1825)  HBsAg: NEGATIVE (02/14 1825)  HIV: NON REACTIVE (02/14 1825)  GBS: NEGATIVE (07/23 1154)  GC/Chl: Negative  Assessment/Plan: Will observe and check for cervical change.  If the patient is in labor will proceed with primary C/S, although since she has eating this morning, 3pm is the earliest we would schedule.  If not in labor, pt can go home with scheduled C/S on 8/10.  Plan discussed with Dr. Debroah Loop.  Nupur Hohman 04/22/2011, 11:42 AM

## 2011-04-22 NOTE — Progress Notes (Signed)
Pt states ctx's began last pm, was 1-2 in clinic today per Dr. Shawnie Pons, bloody show post MD exam, +FM from both babies.

## 2011-04-24 ENCOUNTER — Encounter (HOSPITAL_COMMUNITY): Payer: Self-pay

## 2011-04-24 ENCOUNTER — Encounter (HOSPITAL_COMMUNITY)
Admission: RE | Admit: 2011-04-24 | Discharge: 2011-04-24 | Disposition: A | Discharge status: Home / Self Care | Payer: Medicaid Other | Source: Ambulatory Visit | Attending: Family Medicine | Admitting: Family Medicine

## 2011-04-24 HISTORY — DX: Gastro-esophageal reflux disease without esophagitis: K21.9

## 2011-04-24 LAB — CBC
MCH: 33.3 pg (ref 26.0–34.0)
MCHC: 35.2 g/dL (ref 30.0–36.0)
MCV: 94.8 fL (ref 78.0–100.0)
Platelets: 170 10*3/uL (ref 150–400)
RDW: 12.7 % (ref 11.5–15.5)

## 2011-04-24 LAB — BASIC METABOLIC PANEL
BUN: 6 mg/dL (ref 6–23)
CO2: 21 mEq/L (ref 19–32)
Calcium: 10.1 mg/dL (ref 8.4–10.5)
Creatinine, Ser: <0.47 mg/dL — ABNORMAL LOW (ref 0.50–1.10)
Glucose, Bld: 102 mg/dL — ABNORMAL HIGH (ref 70–99)

## 2011-04-24 LAB — SURGICAL PCR SCREEN: MRSA, PCR: NEGATIVE

## 2011-04-24 NOTE — Pre-Procedure Instructions (Addendum)
Interpreter Fatima from [employer] with pt for pat visit and  will be here with pt dos

## 2011-04-24 NOTE — Patient Instructions (Signed)
20 [name]  04/24/2011   Your procedure is scheduled on:  Friday  Enter through the Main Entrance of [facility] at 1230PM   Pick up the phone at the desk and dial [phone].   Call this number if you have problems the morning of surgery: [phone]   Remember:   Do not eat food:After Midnight.  Do not drink clear liquids: 4 Hours before arrival.until 10 am morning of surgery  Take these medicines the morning of surgery with A SIP OF WATER: hold glyburide morning of surgery   Do not wear jewelry, make-up or nail polish.  Do not wear lotions, powders, or perfumes. You may wear deodorant.  Do not shave 48 hours prior to surgery.  Do not bring valuables to the hospital.  Contacts, dentures or bridgework may not be worn into surgery.  Leave suitcase in the car. After surgery it may be brought to your room.  For patients admitted to the hospital, checkout time is 11:00 AM the day of discharge.   Patients discharged the day of surgery will not be allowed to drive home.  Name and phone number of your driver: Arbutus Ped [phone]    Please read over the following fact sheets that you were given: chg shower per instructions

## 2011-04-25 MED ORDER — DEXTROSE 5 % IV SOLN
2.0000 g | INTRAVENOUS | Status: AC
  Administered 2011-04-26: 2 g via INTRAVENOUS
  Filled 2011-04-25: qty 2

## 2011-04-26 ENCOUNTER — Inpatient Hospital Stay (HOSPITAL_COMMUNITY)
Admission: RE | Admit: 2011-04-26 | Discharge: 2011-04-29 | DRG: 766 | Disposition: A | Discharge status: Home / Self Care | Payer: Medicaid Other | Source: Ambulatory Visit | Attending: Family Medicine | Admitting: Family Medicine

## 2011-04-26 ENCOUNTER — Other Ambulatory Visit: Payer: Self-pay | Admitting: Family Medicine

## 2011-04-26 ENCOUNTER — Encounter (HOSPITAL_COMMUNITY): Payer: Self-pay | Admitting: Anesthesiology

## 2011-04-26 ENCOUNTER — Encounter (HOSPITAL_COMMUNITY): Payer: Self-pay | Admitting: Family Medicine

## 2011-04-26 ENCOUNTER — Inpatient Hospital Stay (HOSPITAL_COMMUNITY): Payer: Medicaid Other | Admitting: Anesthesiology

## 2011-04-26 ENCOUNTER — Encounter (HOSPITAL_COMMUNITY)
Admission: RE | Disposition: A | Discharge status: Home / Self Care | Payer: Self-pay | Source: Ambulatory Visit | Attending: Family Medicine

## 2011-04-26 DIAGNOSIS — O3090 Multiple gestation, unspecified, unspecified trimester: Secondary | ICD-10-CM

## 2011-04-26 DIAGNOSIS — O329XX0 Maternal care for malpresentation of fetus, unspecified, not applicable or unspecified: Secondary | ICD-10-CM

## 2011-04-26 DIAGNOSIS — O30009 Twin pregnancy, unspecified number of placenta and unspecified number of amniotic sacs, unspecified trimester: Secondary | ICD-10-CM

## 2011-04-26 DIAGNOSIS — O99814 Abnormal glucose complicating childbirth: Secondary | ICD-10-CM | POA: Diagnosis present

## 2011-04-26 LAB — RPR: RPR Ser Ql: NONREACTIVE

## 2011-04-26 LAB — TYPE AND SCREEN: Antibody Screen: NEGATIVE

## 2011-04-26 SURGERY — Surgical Case
Anesthesia: Spinal | Site: Abdomen | Wound class: Clean Contaminated

## 2011-04-26 MED ORDER — MORPHINE SULFATE 0.5 MG/ML IJ SOLN
INTRAMUSCULAR | Status: AC
  Filled 2011-04-26: qty 10

## 2011-04-26 MED ORDER — PHENYLEPHRINE 40 MCG/ML (10ML) SYRINGE FOR IV PUSH (FOR BLOOD PRESSURE SUPPORT)
PREFILLED_SYRINGE | INTRAVENOUS | Status: AC
  Filled 2011-04-26: qty 5

## 2011-04-26 MED ORDER — MEPERIDINE HCL 25 MG/ML IJ SOLN: 6.2500 mg | INTRAMUSCULAR | Status: DC | PRN

## 2011-04-26 MED ORDER — HYDROMORPHONE HCL 1 MG/ML IJ SOLN: 2.0000 mg | Freq: Once | INTRAMUSCULAR | Status: DC

## 2011-04-26 MED ORDER — PRENATAL PLUS 27-1 MG PO TABS
1.0000 | ORAL_TABLET | Freq: Every day | ORAL | Status: DC
  Administered 2011-04-27 – 2011-04-29 (×3): 1 via ORAL
  Filled 2011-04-26 (×3): qty 1

## 2011-04-26 MED ORDER — ONDANSETRON HCL 4 MG/2ML IJ SOLN
INTRAMUSCULAR | Status: AC
  Filled 2011-04-26: qty 2

## 2011-04-26 MED ORDER — BUPIVACAINE HCL (PF) 0.25 % IJ SOLN
INTRAMUSCULAR | Status: DC | PRN
  Administered 2011-04-26: 30 mL

## 2011-04-26 MED ORDER — IBUPROFEN 600 MG PO TABS
600.0000 mg | ORAL_TABLET | Freq: Four times a day (QID) | ORAL | Status: DC
  Administered 2011-04-27 – 2011-04-29 (×9): 600 mg via ORAL
  Filled 2011-04-26 (×5): qty 1

## 2011-04-26 MED ORDER — OXYTOCIN 20 UNITS IN LACTATED RINGERS INFUSION - SIMPLE
INTRAVENOUS | Status: DC | PRN
  Administered 2011-04-26 (×2): 20 [IU] via INTRAVENOUS

## 2011-04-26 MED ORDER — OXYTOCIN 20 UNITS IN LACTATED RINGERS INFUSION - SIMPLE: 125.0000 mL/h | INTRAVENOUS | Status: AC

## 2011-04-26 MED ORDER — SCOPOLAMINE 1 MG/3DAYS TD PT72
1.0000 | MEDICATED_PATCH | TRANSDERMAL | Status: DC
  Administered 2011-04-26: 1.5 mg via TRANSDERMAL

## 2011-04-26 MED ORDER — FENTANYL CITRATE 0.05 MG/ML IJ SOLN
INTRAMUSCULAR | Status: AC
  Filled 2011-04-26: qty 2

## 2011-04-26 MED ORDER — TETANUS-DIPHTH-ACELL PERTUSSIS 5-2.5-18.5 LF-MCG/0.5 IM SUSP: 0.5000 mL | Freq: Once | INTRAMUSCULAR | Status: DC

## 2011-04-26 MED ORDER — DIPHENHYDRAMINE HCL 25 MG PO CAPS: 25.0000 mg | ORAL_CAPSULE | Freq: Four times a day (QID) | ORAL | Status: DC | PRN

## 2011-04-26 MED ORDER — IBUPROFEN 600 MG PO TABS
600.0000 mg | ORAL_TABLET | Freq: Four times a day (QID) | ORAL | Status: DC | PRN
  Filled 2011-04-26 (×5): qty 1

## 2011-04-26 MED ORDER — SCOPOLAMINE 1 MG/3DAYS TD PT72: 1.0000 | MEDICATED_PATCH | Freq: Once | TRANSDERMAL | Status: DC

## 2011-04-26 MED ORDER — LACTATED RINGERS IV SOLN
INTRAVENOUS | Status: DC
  Administered 2011-04-27: 06:00:00 via INTRAVENOUS

## 2011-04-26 MED ORDER — SODIUM CHLORIDE 0.9 % IV SOLN
1.0000 ug/kg/h | INTRAVENOUS | Status: DC | PRN
  Filled 2011-04-26: qty 2.5

## 2011-04-26 MED ORDER — DIPHENHYDRAMINE HCL 50 MG/ML IJ SOLN: 25.0000 mg | INTRAMUSCULAR | Status: DC | PRN

## 2011-04-26 MED ORDER — KETOROLAC TROMETHAMINE 60 MG/2ML IM SOLN
60.0000 mg | Freq: Once | INTRAMUSCULAR | Status: AC | PRN
  Filled 2011-04-26: qty 2

## 2011-04-26 MED ORDER — KETOROLAC TROMETHAMINE 30 MG/ML IJ SOLN
30.0000 mg | Freq: Four times a day (QID) | INTRAMUSCULAR | Status: AC | PRN
  Administered 2011-04-26: 30 mg via INTRAVENOUS
  Filled 2011-04-26: qty 1

## 2011-04-26 MED ORDER — SENNOSIDES-DOCUSATE SODIUM 8.6-50 MG PO TABS
2.0000 | ORAL_TABLET | Freq: Every day | ORAL | Status: DC
  Administered 2011-04-26 – 2011-04-28 (×3): 2 via ORAL

## 2011-04-26 MED ORDER — SIMETHICONE 80 MG PO CHEW
80.0000 mg | CHEWABLE_TABLET | Freq: Three times a day (TID) | ORAL | Status: DC
  Administered 2011-04-26 – 2011-04-29 (×8): 80 mg via ORAL

## 2011-04-26 MED ORDER — OXYTOCIN 10 UNIT/ML IJ SOLN
INTRAMUSCULAR | Status: AC
  Filled 2011-04-26: qty 4

## 2011-04-26 MED ORDER — PHENYLEPHRINE HCL 10 MG/ML IJ SOLN
INTRAMUSCULAR | Status: DC | PRN
  Administered 2011-04-26: 40 ug via INTRAVENOUS
  Administered 2011-04-26 (×4): 80 ug via INTRAVENOUS

## 2011-04-26 MED ORDER — LACTATED RINGERS IV SOLN
INTRAVENOUS | Status: DC | PRN
  Administered 2011-04-26 (×4): via INTRAVENOUS

## 2011-04-26 MED ORDER — ONDANSETRON HCL 4 MG/2ML IJ SOLN: 4.0000 mg | INTRAMUSCULAR | Status: DC | PRN

## 2011-04-26 MED ORDER — KETOROLAC TROMETHAMINE 30 MG/ML IJ SOLN: 30.0000 mg | Freq: Four times a day (QID) | INTRAMUSCULAR | Status: AC | PRN

## 2011-04-26 MED ORDER — EPHEDRINE SULFATE 50 MG/ML IJ SOLN
INTRAMUSCULAR | Status: DC | PRN
  Administered 2011-04-26 (×5): 10 mg via INTRAVENOUS

## 2011-04-26 MED ORDER — SIMETHICONE 80 MG PO CHEW
80.0000 mg | CHEWABLE_TABLET | ORAL | Status: DC | PRN
  Administered 2011-04-28: 80 mg via ORAL

## 2011-04-26 MED ORDER — DIPHENHYDRAMINE HCL 50 MG/ML IJ SOLN
INTRAMUSCULAR | Status: AC
  Administered 2011-04-26: 12.5 mg via INTRAVENOUS
  Filled 2011-04-26: qty 1

## 2011-04-26 MED ORDER — FENTANYL CITRATE 0.05 MG/ML IJ SOLN
INTRAMUSCULAR | Status: DC | PRN
  Administered 2011-04-26: 20 ug via INTRATHECAL

## 2011-04-26 MED ORDER — HYDROMORPHONE HCL 1 MG/ML IJ SOLN
INTRAMUSCULAR | Status: AC
  Filled 2011-04-26: qty 1

## 2011-04-26 MED ORDER — DIBUCAINE 1 % RE OINT: 1.0000 "application " | TOPICAL_OINTMENT | RECTAL | Status: DC | PRN

## 2011-04-26 MED ORDER — MENTHOL 3 MG MT LOZG: 1.0000 | LOZENGE | OROMUCOSAL | Status: DC | PRN

## 2011-04-26 MED ORDER — MORPHINE SULFATE (PF) 0.5 MG/ML IJ SOLN
INTRAMUSCULAR | Status: DC | PRN
  Administered 2011-04-26: .15 mg via INTRATHECAL

## 2011-04-26 MED ORDER — ZOLPIDEM TARTRATE 5 MG PO TABS: 5.0000 mg | ORAL_TABLET | Freq: Every evening | ORAL | Status: DC | PRN

## 2011-04-26 MED ORDER — NALOXONE HCL 0.4 MG/ML IJ SOLN: 0.4000 mg | INTRAMUSCULAR | Status: DC | PRN

## 2011-04-26 MED ORDER — ONDANSETRON HCL 4 MG PO TABS: 4.0000 mg | ORAL_TABLET | ORAL | Status: DC | PRN

## 2011-04-26 MED ORDER — DIPHENHYDRAMINE HCL 50 MG/ML IJ SOLN
12.5000 mg | INTRAMUSCULAR | Status: DC | PRN
  Administered 2011-04-26: 12.5 mg via INTRAVENOUS

## 2011-04-26 MED ORDER — ONDANSETRON HCL 4 MG/2ML IJ SOLN: 4.0000 mg | Freq: Three times a day (TID) | INTRAMUSCULAR | Status: DC | PRN

## 2011-04-26 MED ORDER — DIPHENHYDRAMINE HCL 25 MG PO CAPS
25.0000 mg | ORAL_CAPSULE | ORAL | Status: DC | PRN
  Administered 2011-04-27: 25 mg via ORAL
  Filled 2011-04-26 (×2): qty 1

## 2011-04-26 MED ORDER — KETOROLAC TROMETHAMINE 60 MG/2ML IM SOLN
INTRAMUSCULAR | Status: AC
  Administered 2011-04-26: 60 mg via INTRAMUSCULAR
  Filled 2011-04-26: qty 2

## 2011-04-26 MED ORDER — OXYCODONE-ACETAMINOPHEN 5-325 MG PO TABS
1.0000 | ORAL_TABLET | ORAL | Status: DC | PRN
  Administered 2011-04-27 (×5): 1 via ORAL
  Administered 2011-04-27: 2 via ORAL
  Administered 2011-04-27: 1 via ORAL
  Administered 2011-04-28 – 2011-04-29 (×5): 2 via ORAL
  Administered 2011-04-29: 1 via ORAL
  Administered 2011-04-29: 2 via ORAL
  Filled 2011-04-26: qty 1
  Filled 2011-04-26 (×3): qty 2
  Filled 2011-04-26 (×2): qty 1
  Filled 2011-04-26: qty 2
  Filled 2011-04-26: qty 1
  Filled 2011-04-26 (×2): qty 2
  Filled 2011-04-26: qty 1
  Filled 2011-04-26: qty 2
  Filled 2011-04-26 (×2): qty 1

## 2011-04-26 MED ORDER — BUPIVACAINE IN DEXTROSE 0.75-8.25 % IT SOLN
INTRATHECAL | Status: DC | PRN
  Administered 2011-04-26: 1.5 mL via INTRATHECAL

## 2011-04-26 MED ORDER — EPHEDRINE 5 MG/ML INJ
INTRAVENOUS | Status: AC
  Filled 2011-04-26: qty 10

## 2011-04-26 MED ORDER — WITCH HAZEL-GLYCERIN EX PADS: 1.0000 "application " | MEDICATED_PAD | CUTANEOUS | Status: DC | PRN

## 2011-04-26 MED ORDER — NALBUPHINE SYRINGE 5 MG/0.5 ML
5.0000 mg | INJECTION | INTRAMUSCULAR | Status: DC | PRN
  Filled 2011-04-26: qty 1

## 2011-04-26 MED ORDER — METOCLOPRAMIDE HCL 5 MG/ML IJ SOLN
10.0000 mg | Freq: Three times a day (TID) | INTRAMUSCULAR | Status: DC | PRN
Start: 2011-04-26 — End: 2011-04-29

## 2011-04-26 MED ORDER — LANOLIN HYDROUS EX OINT: 1.0000 "application " | TOPICAL_OINTMENT | CUTANEOUS | Status: DC | PRN

## 2011-04-26 MED ORDER — SODIUM CHLORIDE 0.9 % IJ SOLN: 3.0000 mL | INTRAMUSCULAR | Status: DC | PRN

## 2011-04-26 MED ORDER — NALBUPHINE SYRINGE 5 MG/0.5 ML
5.0000 mg | INJECTION | INTRAMUSCULAR | Status: DC | PRN
  Administered 2011-04-26: 10 mg via INTRAVENOUS
  Filled 2011-04-26: qty 1

## 2011-04-26 MED ORDER — ONDANSETRON HCL 4 MG/2ML IJ SOLN
INTRAMUSCULAR | Status: DC | PRN
  Administered 2011-04-26: 4 mg via INTRAVENOUS

## 2011-04-26 SURGICAL SUPPLY — 30 items
CHLORAPREP W/TINT 26ML (MISCELLANEOUS) ×2 IMPLANT
CLOTH BEACON ORANGE TIMEOUT ST (SAFETY) ×2 IMPLANT
DRESSING TELFA 8X3 (GAUZE/BANDAGES/DRESSINGS) ×4 IMPLANT
ELECT REM PT RETURN 9FT ADLT (ELECTROSURGICAL) ×2
ELECTRODE REM PT RTRN 9FT ADLT (ELECTROSURGICAL) ×1 IMPLANT
EXTRACTOR VACUUM M CUP 4 TUBE (SUCTIONS) IMPLANT
GAUZE SPONGE 4X4 12PLY STRL LF (GAUZE/BANDAGES/DRESSINGS) ×4 IMPLANT
GLOVE BIOGEL PI IND STRL 7.0 (GLOVE) ×1 IMPLANT
GLOVE BIOGEL PI INDICATOR 7.0 (GLOVE) ×1
GLOVE ECLIPSE 7.0 STRL STRAW (GLOVE) ×4 IMPLANT
GOWN PREVENTION PLUS LG XLONG (DISPOSABLE) ×4 IMPLANT
GOWN PREVENTION PLUS XLARGE (GOWN DISPOSABLE) ×2 IMPLANT
KIT ABG SYR 3ML LUER SLIP (SYRINGE) IMPLANT
NEEDLE HYPO 22GX1.5 SAFETY (NEEDLE) ×2 IMPLANT
NEEDLE HYPO 25X5/8 SAFETYGLIDE (NEEDLE) IMPLANT
NS IRRIG 1000ML POUR BTL (IV SOLUTION) ×2 IMPLANT
PACK C SECTION WH (CUSTOM PROCEDURE TRAY) ×2 IMPLANT
PAD ABD 7.5X8 STRL (GAUZE/BANDAGES/DRESSINGS) IMPLANT
RTRCTR C-SECT PINK 25CM LRG (MISCELLANEOUS) IMPLANT
SLEEVE SCD COMPRESS KNEE MED (MISCELLANEOUS) IMPLANT
SPONGE GAUZE 4X4 12PLY (GAUZE/BANDAGES/DRESSINGS) ×2 IMPLANT
STAPLER VISISTAT 35W (STAPLE) IMPLANT
SUT VIC AB 0 CTX 36 (SUTURE) ×3
SUT VIC AB 0 CTX36XBRD ANBCTRL (SUTURE) ×3 IMPLANT
SUT VIC AB 4-0 KS 27 (SUTURE) IMPLANT
SYR 30ML LL (SYRINGE) ×2 IMPLANT
TAPE CLOTH SURG 4X10 WHT LF (GAUZE/BANDAGES/DRESSINGS) ×2 IMPLANT
TOWEL OR 17X24 6PK STRL BLUE (TOWEL DISPOSABLE) ×4 IMPLANT
TRAY FOLEY CATH 14FR (SET/KITS/TRAYS/PACK) ×2 IMPLANT
WATER STERILE IRR 1000ML POUR (IV SOLUTION) IMPLANT

## 2011-04-26 NOTE — Anesthesia Postprocedure Evaluation (Signed)
  Anesthesia Post-op Note  Patient: Sylvia Best  Procedure(s) Performed:  CESAREAN SECTION   Patient is awake, responsive, moving her legs, and has signs of resolution of her numbness. Pain and nausea are reasonably well controlled. Vital signs are stable and clinically acceptable. Oxygen saturation is clinically acceptable. There are no apparent anesthetic complications at this time. Patient is ready for discharge.

## 2011-04-26 NOTE — Anesthesia Preprocedure Evaluation (Addendum)
Anesthesia Evaluation  Name, MR# and DOB Patient awake  General Assessment Comment  Reviewed: Allergy & Precautions, H&P , Patient's Chart, lab work & pertinent test results  Airway Mallampati: II TM Distance: >3 FB Neck ROM: full    Dental No notable dental hx.    Pulmonary  clear to auscultation  pulmonary exam normalPulmonary Exam Normal breath sounds clear to auscultation none    Cardiovascular Exercise Tolerance: Good regular Normal    Neuro/Psych   GI/Hepatic/Renal (+)  GERD      Endo/Other  (+) Diabetes mellitus-, Gestational,     Abdominal   Musculoskeletal   Hematology   Peds  Reproductive/Obstetrics    Anesthesia Other Findings          Anesthesia Physical Anesthesia Plan  ASA: III  Anesthesia Plan: Spinal   Post-op Pain Management:    Induction:   Airway Management Planned:   Additional Equipment:   Intra-op Plan:   Post-operative Plan:   Informed Consent: I have reviewed the patients History and Physical, chart, labs and discussed the procedure including the risks, benefits and alternatives for the proposed anesthesia with the patient or authorized representative who has indicated his/her understanding and acceptance.   Dental Advisory Given  Plan Discussed with: CRNA  Anesthesia Plan Comments: (History discussed through interpreter. Lab work confirmed with CRNA in room. Platelets okay. Discussed spinal anesthetic, and patient consents to the procedure:  included risk of possible headache,backache, failed block, allergic reaction, and nerve injury. This patient was asked if she had any questions or concerns before the procedure started. )     Anesthesia Quick Evaluation

## 2011-04-26 NOTE — Pre-Procedure Instructions (Signed)
Fetal heart rate baby a170, baby b 172

## 2011-04-26 NOTE — H&P (Signed)
Sylvia Best is a 30 y.o. female presenting for Primary C-section for Trv/trv lie and twin gestation, with GDM A2. Maternal Medical History:  Reason for admission: Reason for Admission:   nauseaFetal activity: Perceived fetal activity is normal.    Prenatal Complications - Diabetes: gestational. Diabetes is managed by oral agent (monotherapy).      OB History    Grav Para Term Preterm Abortions TAB SAB Ect Mult Living   3 0 0 0 2 0 2 0 0 0      Past Medical History  Diagnosis Date  . Diabetes mellitus     gestational-takes glyburide-fbs this am -69 instructed to hold glyburide day of c section  . GERD (gastroesophageal reflux disease)     no meds   Past Surgical History  Procedure Date  . Tonsillectomy     age 34   Family History: family history includes Diabetes in her father and paternal grandfather; Heart disease in her father; and Hypertension in her mother. Social History:  reports that she has never smoked. She has never used smokeless tobacco. She reports that she does not drink alcohol or use illicit drugs.  Review of Systems  Constitutional: Negative for fever and chills.  Respiratory: Negative for cough, shortness of breath and wheezing.   Cardiovascular: Negative for chest pain.  Gastrointestinal: Negative for nausea, vomiting, abdominal pain, diarrhea and constipation.  Genitourinary: Negative for dysuria and urgency.  Musculoskeletal: Negative for myalgias and joint pain.  Skin: Negative for rash.  Neurological: Negative for dizziness, tingling, tremors and headaches.  Psychiatric/Behavioral: Negative for depression.      Blood pressure 120/83, pulse 101, temperature 97.7 F (36.5 C), temperature source Oral, resp. rate 18, last menstrual period 08/03/2010, SpO2 100.00%. Maternal Exam:  Uterine Assessment: Contraction frequency is irregular.   Abdomen: Fundal height is term.    Introitus: not evaluated.     Physical Exam  Vitals  reviewed. Constitutional: She is oriented to person, place, and time. She appears well-developed and well-nourished.  HENT:  Head: Normocephalic and atraumatic.  Neck: Neck supple. No thyromegaly present.  Cardiovascular: Normal rate and regular rhythm.   Respiratory: Effort normal and breath sounds normal.  GI: Soft. There is no tenderness.       gravid  Musculoskeletal: Normal range of motion.  Neurological: She is alert and oriented to person, place, and time.  Skin: Skin is warm and dry.  Psychiatric: She has a normal mood and affect.    Prenatal labs: ABO, Rh: B POS (12/14 1000) Antibody: NEG (02/14 1825) Rubella:   RPR: NON REAC (02/14 1825)  HBsAg: NEGATIVE (02/14 1825)  HIV: NON REACTIVE (02/14 1825)  GBS: NEGATIVE (07/23 1154)   Assessment/Plan: DC/CA twins with abnl lie/trv/trv @ 38 wks GDM A2    Monia Timmers S 04/26/2011, 12:40 PM

## 2011-04-26 NOTE — Op Note (Signed)
Cesarean Section Procedure Note  Indications: malpresentation: transverse lie of twins  Pre-operative Diagnosis: 38 week 0 day pregnancy.  Post-operative Diagnosis: same  Surgeon: Tinnie Gens, MD  Assistants: Lucina Mellow, DO and Deniece Portela, PA-S  Anesthesia: Spinal anesthesia  ASA Class: 3  Procedure Details  The patient was seen in the Holding Room. The risks, benefits, complications, treatment options, and expected outcomes were discussed with the patient.  The patient concurred with the proposed plan, giving informed consent.  The site of surgery properly noted/marked. The patient was taken to Operating Room, identified as Sylvia Best and the procedure verified as C-Section Delivery. A Time Out was held and the above information confirmed.  After induction of anesthesia, the patient was draped and prepped in the usual sterile manner. A Pfannenstiel incision was made and carried down through the subcutaneous tissue to the fascia. Fascial incision was made and extended transversely. The fascia was separated from the underlying rectus tissue superiorly and inferiorly. The peritoneum was identified and entered. Peritoneal incision was extended longitudinally. [name] was palpated to be transverse back down with feet to the mother's Left side. A low transverse uterine incision was made. Baby ADelivered from breech presentation was a 2340 gram female with Apgar scores of 8 at one minute and 9 at five minutes. [name] was palpated to be transverse back down with head to the Maternal Left, and was delivered in vertex presentation, was 2435 gram female with Apgars of 8 at one minute and 9 at five minutes. After the umbilical cord was clamped and cut cord blood was obtained for evaluation, labeled each for [name] and [name] respectively. The placenta was removed intact and appeared normal. The uterine outline, tubes and ovaries appeared normal. The uterine incision was closed with running locked  sutures of 0 Vicryl. A second layer of the same was placed. Hemostasis was observed. The peritoneum was closed with 2-0 vicryl. The fascia was then reapproximated with running sutures of 0 Vicryl. The skin was reapproximated with staples.  Instrument, sponge, and needle counts were correct prior the abdominal closure and at the conclusion of the case.   Findings: Viable infant female twins in transverse presentation, both back down; normal uterus, placenta, tubes and ovaries  Estimated Blood Loss:  850 mL         Urine Output:         Total IV Fluids:         Specimens: Placenta to L&D         Complications:  None; patient tolerated the procedure well.         Disposition: PACU - hemodynamically stable.         Condition: stable

## 2011-04-26 NOTE — Transfer of Care (Signed)
Immediate Anesthesia Transfer of Care Note  Patient: Sylvia Best  Procedure(s) Performed:  CESAREAN SECTION  Patient Location: PACU  Anesthesia Type: Spinal  Level of Consciousness: awake, alert , oriented, patient cooperative and responds to stimulation  Airway & Oxygen Therapy: Patient Spontanous Breathing  Post-op Assessment: Report given to PACU RN  Post vital signs: Reviewed and stable  Complications: No apparent anesthesia complications

## 2011-04-26 NOTE — Progress Notes (Signed)
After several attempts [name] could latch, very sleepy at breast and difficult to maintain latch, [name] did nurse 5 minutes of rhythmic sucking. Attempted to latch [name], too sleepy, hand expressed and gave [name] approx 1ml of colostrum with spoon.  Demonstrated hand pump for mom, also set up DEBP. Gave shells to wear when able to get a bra on. [name] is present and able to interpret well. Mom understands some English. The Grandmother has breastfed her children in the past and seems to be a good support person for mom. Advised mom to offer the breast to the babies every 2-3 hours or when she see's feeding ques. If babies will not nurse, use DEBP and pump for 15 minutes and give babies any EBM received with spoon or medicine dropper. Ask for assistance as needed. Lactation brochure reviewed with mom using Grandmother to interpret. Advised of community resources for breastfeeding mother and advised of outpatient services if needed.

## 2011-04-26 NOTE — Anesthesia Procedure Notes (Addendum)
Spinal Block  Patient location during procedure: OR Start time: 04/26/2011 1:36 PM Staffing Anesthesiologist: Jiles Garter Preanesthetic Checklist Completed: patient identified, site marked, surgical consent, pre-op evaluation, timeout performed, IV checked, risks and benefits discussed and monitors and equipment checked Spinal Block Patient position: sitting Prep: DuraPrep Patient monitoring: heart rate, cardiac monitor, continuous pulse ox and blood pressure Approach: midline Location: L3-4 Injection technique: single-shot Needle Needle type: Sprotte  Needle gauge: 24 G Needle length: 9 cm Assessment Sensory level: T4 Events: paresthesia Additional Notes Initial attemts at L3-4, then injection ultimately at L 4-5. Both R and L parasthesia?? At 1st level, ( language barrier) noted, but whatever  they were, they were described at transient.  Spinal Dosage in OR  Bupivicaine ml       1.5 PFMS04   mcg        150 Fentanyl mcg            20

## 2011-04-27 LAB — CBC
MCV: 94.6 fL (ref 78.0–100.0)
Platelets: 143 10*3/uL — ABNORMAL LOW (ref 150–400)
RDW: 12.5 % (ref 11.5–15.5)
WBC: 7.2 10*3/uL (ref 4.0–10.5)

## 2011-04-27 NOTE — Progress Notes (Signed)
Lactation - [name] twin , fussy ,sleepy in beginning of feed . Infant latched after several attempts on and off pattern. With lots of stimulation. Encourages STS . Will check back with mom.

## 2011-04-27 NOTE — Consult Note (Signed)
Consult with Sylvia Best , Infant awake ,fussy , mom hand expressed ,lots of clostrum ,after several attempts infant latched and ate 15 min in a consistent swallowing pattern. Worked with mom to obtain the depth at the breast .

## 2011-04-27 NOTE — Progress Notes (Signed)
Subjective: Postpartum Day #1: Cesarean Delivery Patient reports incisional pain, tolerating PO and no problems voiding.  Ambulating.  Reports mild lochia.  Pain fairly well controlled.  Objective: Vital signs in last 24 hours: Temp:  [97 F (36.1 C)-98.7 F (37.1 C)] 98.5 F (36.9 C) (08/11 0615) Pulse Rate:  [70-101] 87  (08/11 0615) Resp:  [16-20] 20  (08/11 0615) BP: (100-143)/(62-92) 119/85 mmHg (08/11 0615) SpO2:  [97 %-100 %] 98 % (08/11 0615) Weight:  [153 lb (69.4 kg)] 153 lb (69.4 kg) (08/10 1536)  Physical Exam:  General: alert and cooperative Lochia: appropriate Uterine Fundus: firm Incision: healing well, no significant drainage, no dehiscence, no significant erythema DVT Evaluation: No evidence of DVT seen on physical exam. Negative Homan's sign. No cords or calf tenderness. Calf/Ankle edema is present.   Basename 04/27/11 0500 04/24/11 1121  HGB 9.4* 12.1  HCT 26.5* 34.4*    Assessment/Plan: 1. Status post Cesarean section. Doing well postoperatively.  Continue current care.  Pain controlled with percocet.  Encouraged ambulation. 2. GDM - A2 Continue to monitor CBG    STINSON, JACOB JEHIEL 04/27/2011, 9:05 AM

## 2011-04-28 LAB — GLUCOSE, CAPILLARY: Glucose-Capillary: 85 mg/dL (ref 70–99)

## 2011-04-28 NOTE — Progress Notes (Signed)
Post Partum Day 2 (POD2 from CS) Subjective: pt having significant gas pains and nursing reports is not walking much.  Otherwise no complaints.  Wants to stay another day.  Objective: Blood pressure 111/72, pulse 87, temperature 98.6 F (37 C), temperature source Oral, resp. rate 18, weight 153 lb (69.4 kg), last menstrual period 08/03/2010, SpO2 99.00%.  Physical Exam:  General: alert, cooperative and no distress Lochia: appropriate Uterine Fundus: firm Incision: healing well, no significant drainage, no dehiscence, no significant erythema DVT Evaluation: No evidence of DVT seen on physical exam.   Basename 04/27/11 0500  HGB 9.4*  HCT 26.5*    Assessment/Plan: Plan for discharge tomorrow, Breastfeeding and Lactation consult.  Encourage ambulation for gas.  May give suppository if think this would be helpful.   LOS: 2 days   Sylvia Best 04/28/2011, 6:13 AM

## 2011-04-29 LAB — GLUCOSE, CAPILLARY: Glucose-Capillary: 95 mg/dL (ref 70–99)

## 2011-04-29 MED ORDER — OXYCODONE-ACETAMINOPHEN 5-325 MG PO TABS: 1.0000 | ORAL_TABLET | ORAL | Status: AC | PRN

## 2011-04-29 MED ORDER — SENNOSIDES-DOCUSATE SODIUM 8.6-50 MG PO TABS: 2.0000 | ORAL_TABLET | Freq: Every day | ORAL | Status: AC

## 2011-04-29 MED ORDER — IBUPROFEN 600 MG PO TABS: 600.0000 mg | ORAL_TABLET | Freq: Four times a day (QID) | ORAL | Status: AC

## 2011-04-29 NOTE — Progress Notes (Signed)
UR chart review completed.  

## 2011-04-29 NOTE — Discharge Summary (Signed)
Physician Discharge Summary  Patient ID: Sylvia Best MRN: [id number] DOB/AGE: 1981/02/21 29 y.o.  Admit date: 04/26/2011 Discharge date: 04/29/2011  Admission Diagnoses: twin gestation, transverse lie  Discharge Diagnoses: Same   Discharged Condition: stable  Consults: none  Significant Diagnostic Studies:  Lab Results  Component Value Date   WBC 7.2 04/27/2011   HGB 9.4* 04/27/2011   HCT 26.5* 04/27/2011   MCV 94.6 04/27/2011   PLT 143* 04/27/2011     Significant procedures:  Same   .    Bonne, Whack [[id number]]  Delivery Note At 2:16 PM 2 viable female was delivered via C-Section, Low Transverse (Presentation: transverse x2  ).  APGAR: 8, 9; weight 5 lb 5.7 oz (2430 g).   Placenta status: Intact, Manual removal.  Cord: 3 vessels with the following complications: .   Anesthesia: Spinal    Mom to postpartum.  Baby to nursery-stable.  Lindaann Slough MD 04/29/2011, 6:46 AM     Arne Cleveland [[id number]]  Delivery Note At 2:17 PM a viable female was delivered via C-Section, Low Transverse (Presentation transverse: ;  ). , [name] 2435g with Apgars 8/9 Placenta status: Intact, Manual removal.  Cord: 3 vessels with the following complications: .   Anesthesia: Spinal    Mom to postpartum.  Baby to nursery-stable.  Lindaann Slough MD 04/29/2011, 6:46 AM       Hospital Course: Pt is a now 29yo [phone] who presented at 38+2wks for scheduled c-section secondary to transverse lie x2 and gestational diabetes controlled on glyburide 2.5mg .  c-section was performed by Drs Natale Milch and Shawnie Pons without complications.  Post-op, pt's fasting CBGs were <100 off medication.  Her pain was well controlled.  She is breastfeeding. On POD#3, she was AFVSS and meeting all goals for discharge home.  [facility].   Disposition: Home or Self Care  Discharge Orders    Future Appointments: Provider: Department: Dept Phone: Center:     06/06/2011 1:30 PM Chancy Milroy, DO [facility] [phone] [facility]     Future Orders Please Complete By Expires   Diet general      Increase activity slowly      Call MD for:  temperature >100.4      Call MD for:  persistant nausea and vomiting      Call MD for:  severe uncontrolled pain      Lifting restrictions      Comments:   No lifting over 15 lbs   Sexual Activity Restrictions      Comments:   Pelvic rest for 6 weeks     Current Discharge Medication List    START taking these medications   Details  ibuprofen (ADVIL,MOTRIN) 600 MG tablet Take 1 tablet (600 mg total) by mouth every 6 (six) hours. Qty: 60 tablet, Refills: 0    senna-docusate (SENOKOT-S) 8.6-50 MG per tablet Take 2 tablets by mouth at bedtime. Qty: 60 tablet, Refills: 0      CONTINUE these medications which have CHANGED   Details  oxyCODONE-acetaminophen (PERCOCET) 5-325 MG per tablet Take 1-2 tablets by mouth every 3 (three) hours as needed (moderate - severe pain). Qty: 30 tablet, Refills: 0      CONTINUE these medications which have NOT CHANGED   Details  prenatal vitamin w/FE, FA (PRENATAL 1 + 1) 27-1 MG TABS Take 1 tablet by mouth at bedtime.        STOP taking these medications  acetaminophen (TYLENOL) 325 MG tablet      folic acid (FOLVITE) 1 MG tablet      glyBURIDE (DIABETA) 2.5 MG tablet        Follow-up Information    Follow up with [facility] [place] in 6 weeks. (post partum visit)    Contact information:   [address] [place] [address]          Signed: Lindaann Slough. 04/29/2011, 6:46 AM

## 2011-04-29 NOTE — Progress Notes (Signed)
Subjective: Postpartum Day 3: Cesarean Delivery Patient reports tolerating PO, + flatus and + BM. Continues to have gas pain   Objective: Vital signs in last 24 hours: Temp:  [98.5 F (36.9 C)-98.7 F (37.1 C)] 98.5 F (36.9 C) (08/13 0546) Pulse Rate:  [81-91] 81  (08/13 0546) Resp:  [18] 18  (08/13 0546) BP: (116-126)/(70-85) 116/70 mmHg (08/13 0546)  Physical Exam:  General: alert and no distress Lochia: appropriate Uterine Fundus: firm Incision: healing well DVT Evaluation: No evidence of DVT seen on physical exam. Negative Homan's sign.   Basename 04/27/11 0500  HGB 9.4*  HCT 26.5*    Assessment/Plan: Status post Cesarean section. Doing well postoperatively. Encouraged patient to ambulate, will send home with stool softeners.  Breast feeding  Discharge home with standard precautions and return to clinic in 4-6 weeks.  Lindaann Slough MD 04/29/2011, 6:39 AM

## 2011-05-23 ENCOUNTER — Encounter (HOSPITAL_COMMUNITY): Payer: Self-pay | Admitting: Family Medicine

## 2011-06-06 ENCOUNTER — Ambulatory Visit (INDEPENDENT_AMBULATORY_CARE_PROVIDER_SITE_OTHER): Payer: Medicaid Other | Admitting: Family

## 2011-06-06 ENCOUNTER — Encounter: Payer: Self-pay | Admitting: Family Medicine

## 2011-06-06 VITALS — BP 105/74 | HR 90 | Temp 97.5°F | Ht 62.5 in | Wt 121.9 lb

## 2011-06-06 DIAGNOSIS — D649 Anemia, unspecified: Secondary | ICD-10-CM

## 2011-06-06 LAB — CBC
MCH: 31.4 pg (ref 26.0–34.0)
MCHC: 34.1 g/dL (ref 30.0–36.0)
Platelets: 216 10*3/uL (ref 150–400)

## 2011-06-06 MED ORDER — INTEGRA F 125-1 MG PO CAPS: 1.0000 | ORAL_CAPSULE | Freq: Every day | ORAL | Status: DC

## 2011-06-06 MED ORDER — IBUPROFEN 600 MG PO TABS: 600.0000 mg | ORAL_TABLET | Freq: Four times a day (QID) | ORAL | Status: DC | PRN

## 2011-06-06 NOTE — Progress Notes (Signed)
  Subjective:     Sylvia Best is a 30 y.o. female who presents for a postpartum visit. She is 6 weeks postpartum following a low cervical transverse Cesarean section. I have fully reviewed the prenatal and intrapartum course. The delivery was at 38 gestational weeks. Outcome: primary cesarean section, low transverse incision. Anesthesia: spinal. Postpartum course has been unremarkable. Baby's course has been unremarkable. Baby is feeding by both breast and bottle - Enfamil. Bleeding staining only. Bowel function is normal. Bladder function is normal. Patient is not sexually active. Contraception method is none. Postpartum depression screening: negative.    Review of Systems Pertinent items are noted in HPI.   Objective:    BP 105/74  Pulse 90  Temp 97.5 F (36.4 C)  Ht 5' 2.5" (1.588 m)  Wt 121 lb 14.4 oz (55.293 kg)  BMI 21.94 kg/m2  Breastfeeding? Yes  General:  alert, cooperative and appears stated age   Breasts:  negative  Lungs: clear to auscultation bilaterally and normal percussion bilaterally  Heart:  regular rate and rhythm, S1, S2 normal, no murmur, click, rub or gallop  Abdomen: soft, non-tender; bowel sounds normal; no masses,  no organomegaly; incision site without signs of infection   Vulva:  not evaluated  Vagina: not evaluated  Cervix:  not examined  Corpus: normal  Adnexa:  normal adnexa  Rectal Exam: Not performed.        Assessment:    Normal postpartum exam. Pap smear not done at today's visit.   Plan:    1. Contraception: none; Scheduled with [facility] for Nexaplon 2. Return in one week for 2 hr GTT  3.  Pap smear due February 2013 [facility]

## 2011-06-08 ENCOUNTER — Other Ambulatory Visit: Payer: Self-pay | Admitting: Family Medicine

## 2011-06-08 DIAGNOSIS — O24419 Gestational diabetes mellitus in pregnancy, unspecified control: Secondary | ICD-10-CM

## 2011-06-10 ENCOUNTER — Other Ambulatory Visit: Payer: Medicaid Other

## 2011-06-13 ENCOUNTER — Other Ambulatory Visit: Payer: Medicaid Other

## 2011-06-13 DIAGNOSIS — O24439 Gestational diabetes mellitus in the puerperium, unspecified control: Secondary | ICD-10-CM

## 2011-06-14 LAB — GLUCOSE TOLERANCE, 2 HOURS: Glucose, Fasting: 77 mg/dL (ref 70–99)

## 2011-08-27 MED ORDER — LIDOCAINE HCL (PF) 1 % IJ SOLN
INTRAMUSCULAR | Status: AC
  Filled 2011-08-27: qty 2

## 2011-11-01 MED ORDER — MINERAL OIL PO OIL
TOPICAL_OIL | ORAL | Status: AC
  Filled 2011-11-01: qty 30

## 2011-11-02 IMAGING — US US OB TRANSVAGINAL
1 series · 13 of 28 positions shown · non-contrast
Comparison: none

[Series 1: us ob transvaginal · 13 of 32 slices shown]
[im 2/32]
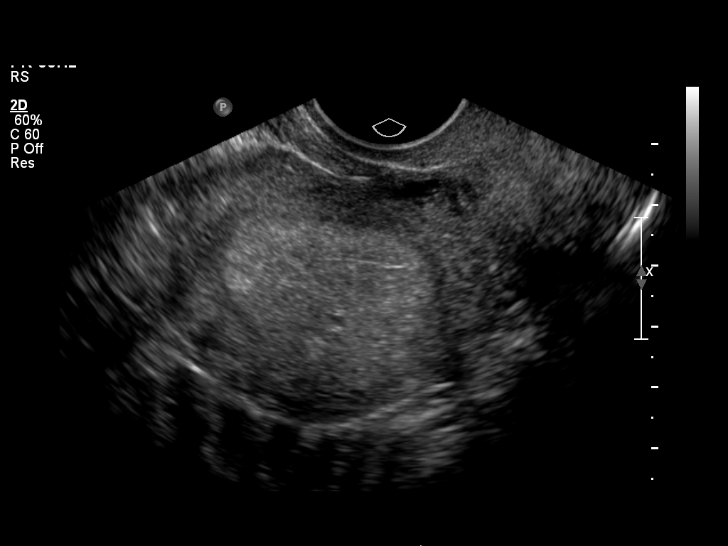
[im 4/32]
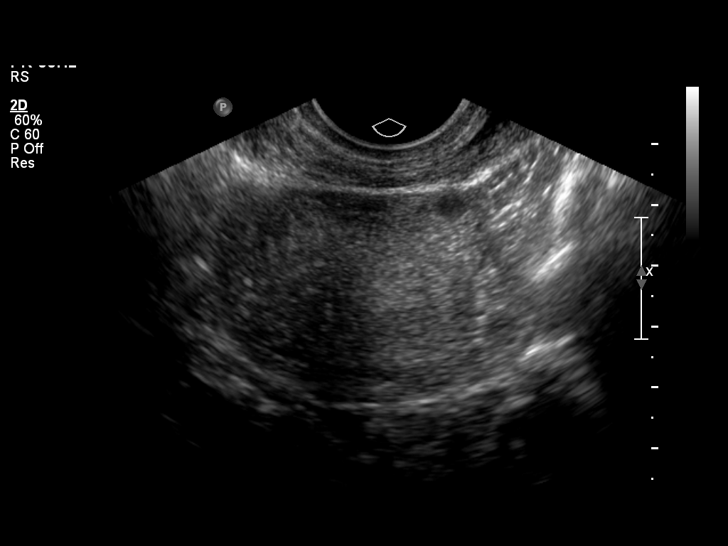
[im 6/32]
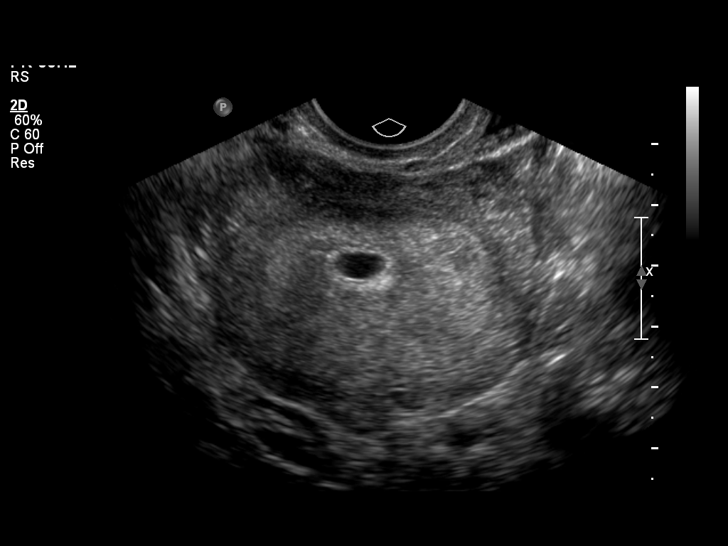
[im 9/32]
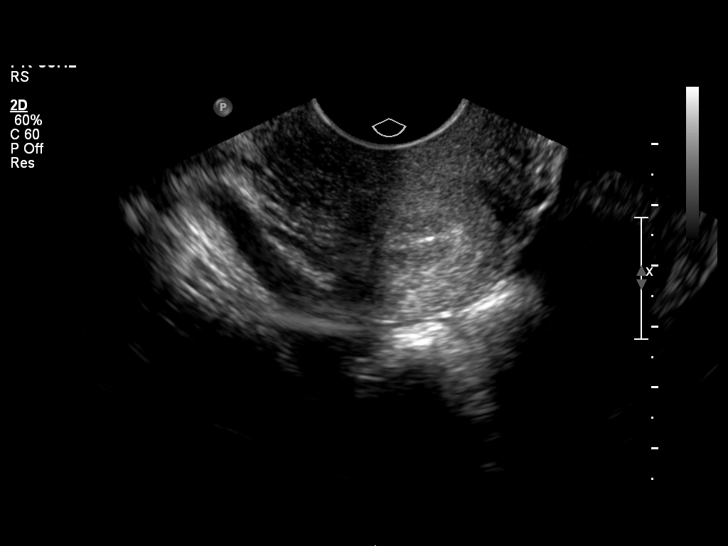
[im 11/32]
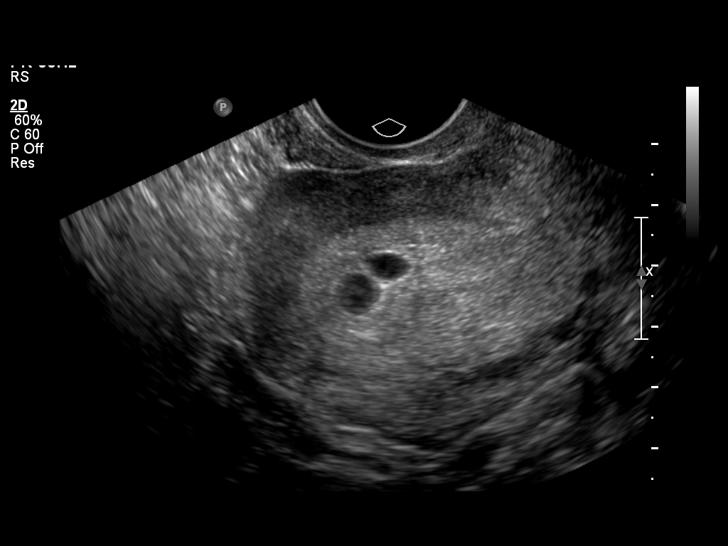
[im 13/32]
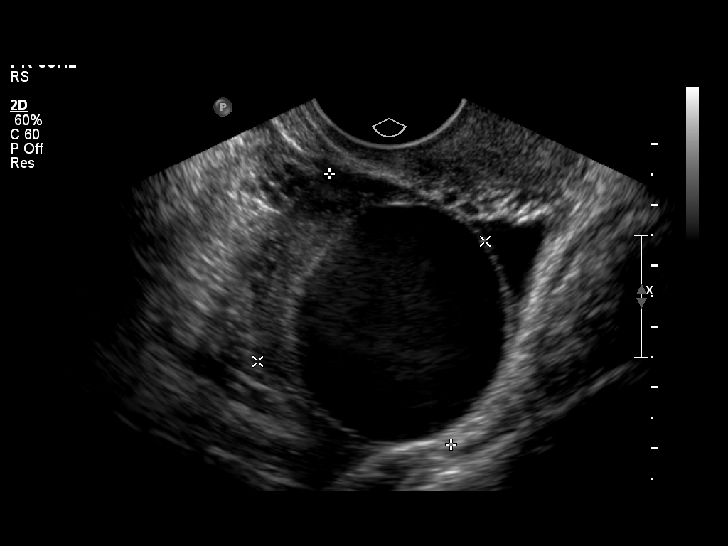
[im 17/32]
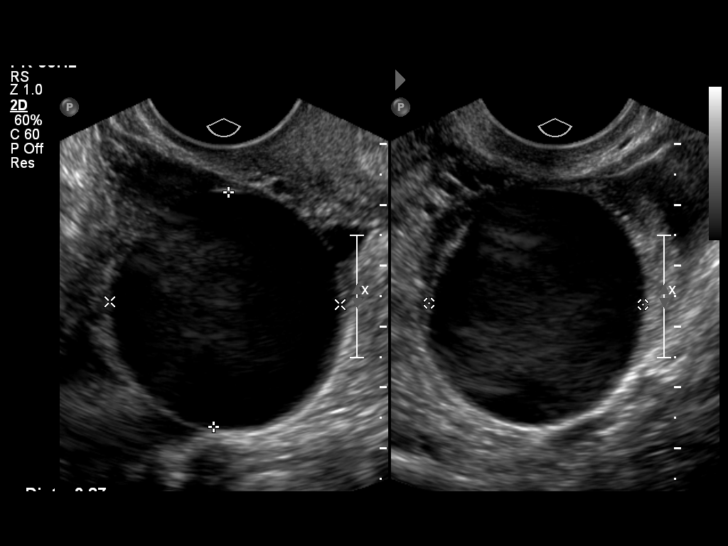
[im 19/32]
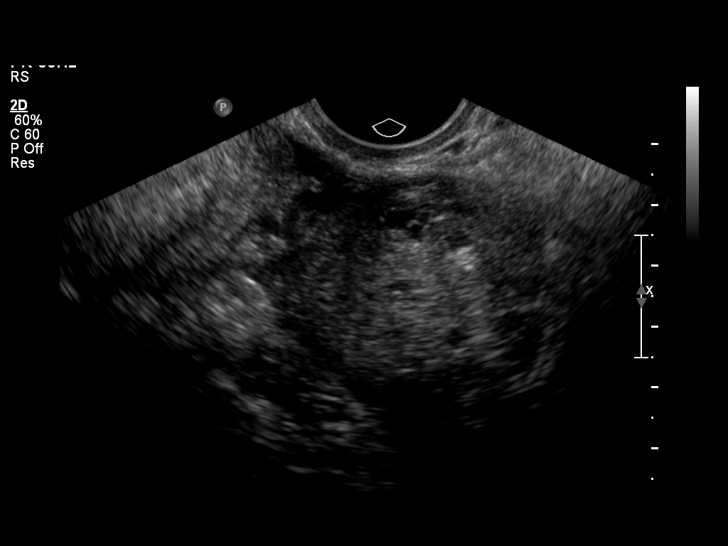
[im 21/32]
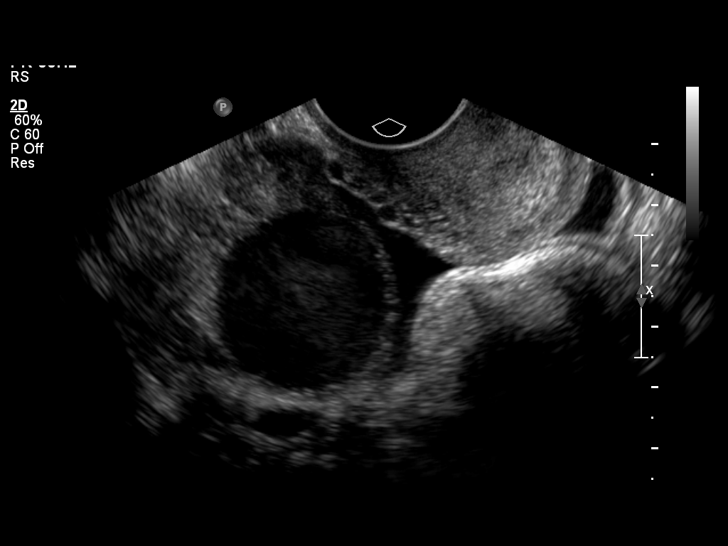
[im 23/32]
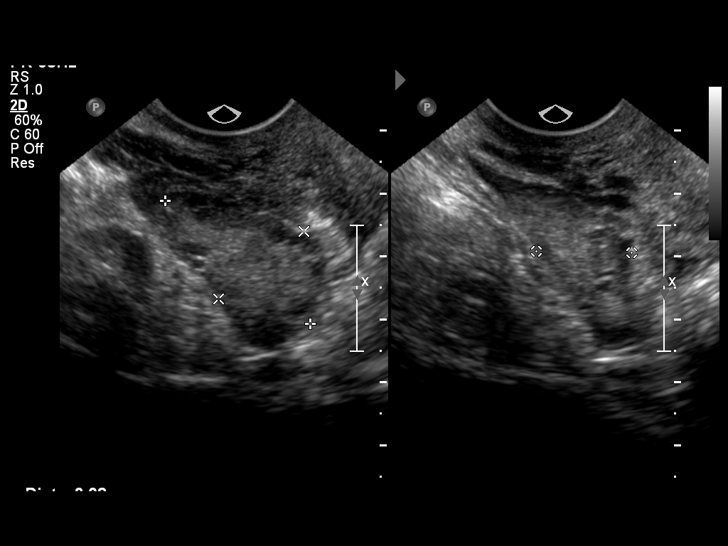
[im 26/32]
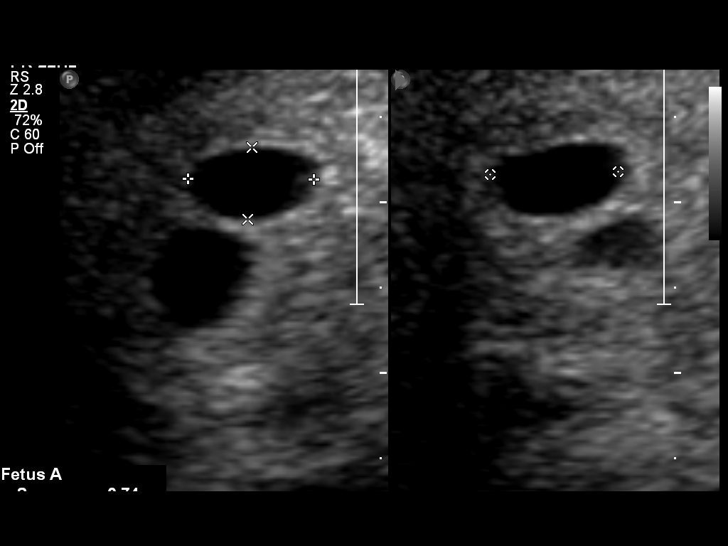
[im 28/32]
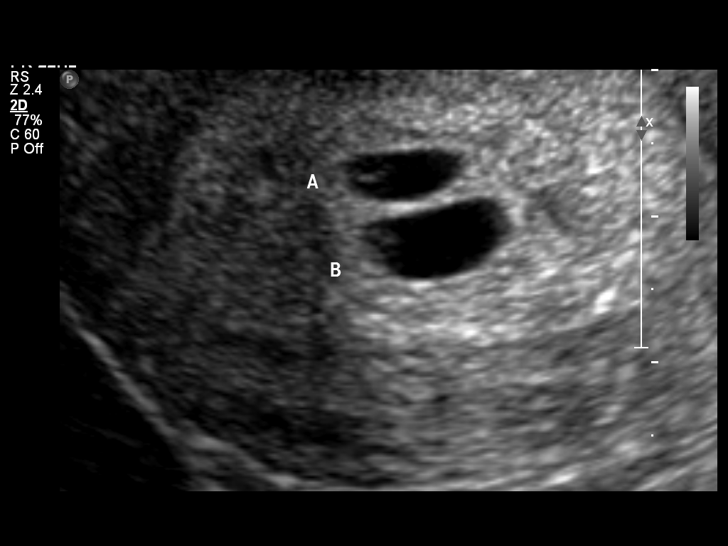
[im 30/32]
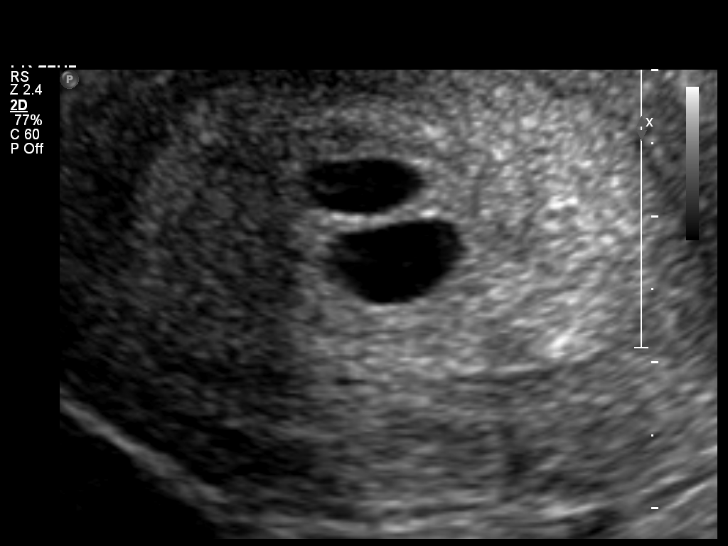

[13 of 28 positions shown; findings below may reference images not displayed]

OBSTETRICS REPORT
                      (Signed Final 09/07/2010 [DATE])

                 HAGOOD
 Order#:         _[id number]
Procedures

 US OB TRANSVAGINAL                                    [id number]
Indications

 No fetal cardiac activity detected;  assess viability
Fetal Evaluation (Fetus A)

 Preg. Location:    Intrauterine
 Gest. Sac:         Intrauterine
 Yolk Sac:          Visualized
 Fetal Pole:        Not visualized
 Cardiac Activity:  No embryo visualized
Biometry (Fetus A)

 GS:       6.4  mm     G. Age:  5w 1d                  EDD:    05/09/11
Gestational Age (Fetus A)

 LMP:           5w 0d         Date:  08/03/10                 EDD:   05/10/11
 Best:          5w 0d      Det. By:  LMP  (08/03/10)          EDD:   05/10/11

Fetal Evaluation (Fetus B)

 Preg. Location:    Intrauterine
 Gest. Sac:         Intrauterine
 Yolk Sac:          Visualized
 Fetal Pole:        Not visualized
 Cardiac Activity:  No embryo visualized
Biometry (Fetus B)

 GS:       7.3  mm     G. Age:  5w 2d                  EDD:    05/08/11
Gestational Age (Fetus B)

 LMP:           5w 0d         Date:  08/03/10                 EDD:   05/10/11
 Best:          5w 0d      Det. By:  LMP  (08/03/10)          EDD:   05/10/11
Cervix Uterus Adnexa

 Cervix:       Normal appearance by transvaginal scan
 Uterus:       No abnormality visualized.
 Cul De Sac:   Small amount of free fluid seen.

 Left Ovary:    3.9 x 3.8 x 3.5 cm simple-appearing cyst again seen.
 Right Ovary:   Within normal limits.
 Adnexa:     Small amount of free fluid in left adnexa and trace
             amount in right adnexa.
Impression

 Diamniotic, dichorionic gestational sacs with yolk sacs. No
 fetal poles are seen associated with either sac, but this would
 not necessarily be expected at today's MSD of 6.4 and
 mm. Follow-up with serial quantative bHCG with rescanning
 in > 12 days can be performed to assess for appropriate
 progression of gestation (fetal poles would be typically
 expected by this time).

 Persistent, stable left simple ovarian cyst, likely functional.

 questions or concerns.

## 2011-11-04 MED ORDER — MINERAL OIL PO OIL
TOPICAL_OIL | ORAL | Status: AC
  Filled 2011-11-04: qty 30

## 2011-11-20 MED ORDER — LIDOCAINE HCL 2 % EX GEL
CUTANEOUS | Status: AC
  Filled 2011-11-20: qty 30

## 2011-12-02 MED ORDER — LIDOCAINE HCL 2 % EX GEL
CUTANEOUS | Status: AC
  Filled 2011-12-02: qty 30

## 2012-03-06 ENCOUNTER — Ambulatory Visit: Payer: Self-pay

## 2012-03-06 ENCOUNTER — Ambulatory Visit (INDEPENDENT_AMBULATORY_CARE_PROVIDER_SITE_OTHER): Payer: Self-pay | Admitting: Emergency Medicine

## 2012-03-06 VITALS — BP 129/82 | HR 102 | Temp 97.9°F | Resp 16 | Ht 63.0 in | Wt 150.0 lb

## 2012-03-06 DIAGNOSIS — J45902 Unspecified asthma with status asthmaticus: Secondary | ICD-10-CM

## 2012-03-06 DIAGNOSIS — R05 Cough: Secondary | ICD-10-CM

## 2012-03-06 DIAGNOSIS — J45909 Unspecified asthma, uncomplicated: Secondary | ICD-10-CM

## 2012-03-06 DIAGNOSIS — O24419 Gestational diabetes mellitus in pregnancy, unspecified control: Secondary | ICD-10-CM

## 2012-03-06 LAB — POCT CBC
Granulocyte percent: 56.9 %G (ref 37–80)
HCT, POC: 39.6 % (ref 37.7–47.9)
MPV: 9.2 fL (ref 0–99.8)
POC Granulocyte: 4.9 (ref 2–6.9)
POC LYMPH PERCENT: 35.1 %L (ref 10–50)
POC MID %: 8 %M (ref 0–12)
RDW, POC: 13.1 %

## 2012-03-06 LAB — GLUCOSE, POCT (MANUAL RESULT ENTRY): POC Glucose: 93 mg/dl (ref 70–99)

## 2012-03-06 MED ORDER — IPRATROPIUM BROMIDE 0.02 % IN SOLN
0.5000 mg | Freq: Once | RESPIRATORY_TRACT | Status: AC
  Administered 2012-03-06: 0.5 mg via RESPIRATORY_TRACT

## 2012-03-06 MED ORDER — ALBUTEROL SULFATE HFA 108 (90 BASE) MCG/ACT IN AERS: 2.0000 | INHALATION_SPRAY | RESPIRATORY_TRACT | Status: DC | PRN

## 2012-03-06 MED ORDER — PREDNISONE 10 MG PO TABS: ORAL_TABLET | ORAL | Status: DC

## 2012-03-06 MED ORDER — ALBUTEROL SULFATE (2.5 MG/3ML) 0.083% IN NEBU
2.5000 mg | INHALATION_SOLUTION | Freq: Once | RESPIRATORY_TRACT | Status: AC
  Administered 2012-03-06: 2.5 mg via RESPIRATORY_TRACT

## 2012-03-06 NOTE — Patient Instructions (Addendum)
Asthma Attack Prevention HOW CAN ASTHMA BE PREVENTED? Currently, there is no way to prevent asthma from starting. However, you can take steps to control the disease and prevent its symptoms after you have been diagnosed. Learn about your asthma and how to control it. Take an active role to control your asthma by working with your caregiver to create and follow an asthma action plan. An asthma action plan guides you in taking your medicines properly, avoiding factors that make your asthma worse, tracking your level of asthma control, responding to worsening asthma, and seeking emergency care when needed. To track your asthma, keep records of your symptoms, check your peak flow number using a peak flow meter (handheld device that shows how well air moves out of your lungs), and get regular asthma checkups.  Other ways to prevent asthma attacks include:  Use medicines as your caregiver directs.   Identify and avoid things that make your asthma worse (as much as you can).   Keep track of your asthma symptoms and level of control.   Get regular checkups for your asthma.   With your caregiver, write a detailed plan for taking medicines and managing an asthma attack. Then be sure to follow your action plan. Asthma is an ongoing condition that needs regular monitoring and treatment.   Identify and avoid asthma triggers. A number of outdoor allergens and irritants (pollen, mold, cold air, air pollution) can trigger asthma attacks. Find out what causes or makes your asthma worse, and take steps to avoid those triggers (see below).   Monitor your breathing. Learn to recognize warning signs of an attack, such as slight coughing, wheezing or shortness of breath. However, your lung function may already decrease before you notice any signs or symptoms, so regularly measure and record your peak airflow with a home peak flow meter.   Identify and treat attacks early. If you act quickly, you're less likely to have  a severe attack. You will also need less medicine to control your symptoms. When your peak flow measurements decrease and alert you to an upcoming attack, take your medicine as instructed, and immediately stop any activity that may have triggered the attack. If your symptoms do not improve, get medical help.   Pay attention to increasing quick-relief inhaler use. If you find yourself relying on your quick-relief inhaler (such as albuterol), your asthma is not under control. See your caregiver about adjusting your treatment.  IDENTIFY AND CONTROL FACTORS THAT MAKE YOUR ASTHMA WORSE A number of common things can set off or make your asthma symptoms worse (asthma triggers). Keep track of your asthma symptoms for several weeks, detailing all the environmental and emotional factors that are linked with your asthma. When you have an asthma attack, go back to your asthma diary to see which factor, or combination of factors, might have contributed to it. Once you know what these factors are, you can take steps to control many of them.  Allergies: If you have allergies and asthma, it is important to take asthma prevention steps at home. Asthma attacks (worsening of asthma symptoms) can be triggered by allergies, which can cause temporary increased inflammation of your airways. Minimizing contact with the substance to which you are allergic will help prevent an asthma attack. Animal Dander:   Some people are allergic to the flakes of skin or dried saliva from animals with fur or feathers. Keep these pets out of your home.   If you can't keep a pet outdoors, keep the   pet out of your bedroom and other sleeping areas at all times, and keep the door closed.   Remove carpets and furniture covered with cloth from your home. If that is not possible, keep the pet away from fabric-covered furniture and carpets.  Dust Mites:  Many people with asthma are allergic to dust mites. Dust mites are tiny bugs that are found in  every home, in mattresses, pillows, carpets, fabric-covered furniture, bedcovers, clothes, stuffed toys, fabric, and other fabric-covered items.   Cover your mattress in a special dust-proof cover.   Cover your pillow in a special dust-proof cover, or wash the pillow each week in hot water. Water must be hotter than 130 F to kill dust mites. Cold or warm water used with detergent and bleach can also be effective.   Wash the sheets and blankets on your bed each week in hot water.   Try not to sleep or lie on cloth-covered cushions.   Call ahead when traveling and ask for a smoke-free hotel room. Bring your own bedding and pillows, in case the hotel only supplies feather pillows and down comforters, which may contain dust mites and cause asthma symptoms.   Remove carpets from your bedroom and those laid on concrete, if you can.   Keep stuffed toys out of the bed, or wash the toys weekly in hot water or cooler water with detergent and bleach.  Cockroaches:  Many people with asthma are allergic to the droppings and remains of cockroaches.   Keep food and garbage in closed containers. Never leave food out.   Use poison baits, traps, powders, gels, or paste (for example, boric acid).   If a spray is used to kill cockroaches, stay out of the room until the odor goes away.  Indoor Mold:  Fix leaky faucets, pipes, or other sources of water that have mold around them.   Clean moldy surfaces with a cleaner that has bleach in it.  Pollen and Outdoor Mold:  When pollen or mold spore counts are high, try to keep your windows closed.   Stay indoors with windows closed from late morning to afternoon, if you can. Pollen and some mold spore counts are highest at that time.   Ask your caregiver whether you need to take or increase anti-inflammatory medicine before your allergy season starts.  Irritants:   Tobacco smoke is an irritant. If you smoke, ask your caregiver how you can quit. Ask family  members to quit smoking, too. Do not allow smoking in your home or car.   If possible, do not use a wood-burning stove, kerosene heater, or fireplace. Minimize exposure to all sources of smoke, including incense, candles, fires, and fireworks.   Try to stay away from strong odors and sprays, such as perfume, talcum powder, hair spray, and paints.   Decrease humidity in your home and use an indoor air cleaning device. Reduce indoor humidity to below 60 percent. Dehumidifiers or central air conditioners can do this.   Try to have someone else vacuum for you once or twice a week, if you can. Stay out of rooms while they are being vacuumed and for a short while afterward.   If you vacuum, use a dust mask from a hardware store, a double-layered or microfilter vacuum cleaner bag, or a vacuum cleaner with a HEPA filter.   Sulfites in foods and beverages can be irritants. Do not drink beer or wine, or eat dried fruit, processed potatoes, or shrimp if they cause asthma   symptoms.   Cold air can trigger an asthma attack. Cover your nose and mouth with a scarf on cold or windy days.   Several health conditions can make asthma more difficult to manage, including runny nose, sinus infections, reflux disease, psychological stress, and sleep apnea. Your caregiver will treat these conditions, as well.   Avoid close contact with people who have a cold or the flu, since your asthma symptoms may get worse if you catch the infection from them. Wash your hands thoroughly after touching items that may have been handled by people with a respiratory infection.   Get a flu shot every year to protect against the flu virus, which often makes asthma worse for days or weeks. Also get a pneumonia shot once every five to 10 years.  Drugs:  Aspirin and other painkillers can cause asthma attacks. 10% to 20% of people with asthma have sensitivity to aspirin or a group of painkillers called non-steroidal anti-inflammatory drugs  (NSAIDS), such as ibuprofen and naproxen. These drugs are used to treat pain and reduce fevers. Asthma attacks caused by any of these medicines can be severe and even fatal. These drugs must be avoided in people who have known aspirin sensitive asthma. Products with acetaminophen are considered safe for people who have asthma. It is important that people with aspirin sensitivity read labels of all over-the-counter drugs used to treat pain, colds, coughs, and fever.   Beta blockers and ACE inhibitors are other drugs which you should discuss with your caregiver, in relation to your asthma.  ALLERGY SKIN TESTING  Ask your asthma caregiver about allergy skin testing or blood testing (RAST test) to identify the allergens to which you are sensitive. If you are found to have allergies, allergy shots (immunotherapy) for asthma may help prevent future allergies and asthma. With allergy shots, small doses of allergens (substances to which you are allergic) are injected under your skin on a regular schedule. Over a period of time, your body may become used to the allergen and less responsive with asthma symptoms. You can also take measures to minimize your exposure to those allergens. EXERCISE  If you have exercise-induced asthma, or are planning vigorous exercise, or exercise in cold, humid, or dry environments, prevent exercise-induced asthma by following your caregiver's advice regarding asthma treatment before exercising. Document Released: 08/21/2009 Document Revised: 08/22/2011 Document Reviewed: 08/21/2009 [employer] Patient Information 2012 [employer]. 

## 2012-03-06 NOTE — Progress Notes (Signed)
  Subjective:    Patient ID: Sylvia Best, female    DOB: [birth year], 31 y.o.   MRN: [id number]  HPI patient enters with 3 week history of chest tightness and difficulty breathing. She does have a history of asthma and when in today and she is to use an inhaler. Since she's been here she's been out of her inhaler. She has been taking Claritin-D she states there is no possibility that she is pregnant. She feels chest tightness but no true chest pain. She has no known allergies to    Review of Systems     Objective:   Physical Exam  Constitutional:       Patient in moderate respiratory distress struggling to breathe. There are audible wheezes present  Cardiovascular: Normal rate and regular rhythm.   Pulmonary/Chest:       Respiratory exam reveals symmetrical breath sounds with bilateral wheezes .  Abdominal: Soft.    [facility] reading (PRIMARY) by  Dr.Sallie Maker chest x-ray shows no acute disease   Results for orders placed in visit on 03/06/12  POCT CBC      Component Value Range   WBC 8.6  4.6 - 10.2 K/uL   Lymph, poc 3.0  0.6 - 3.4   POC LYMPH PERCENT 35.1  10 - 50 %L   MID (cbc) 0.7  0 - 0.9   POC MID % 8.0  0 - 12 %M   POC Granulocyte 4.9  2 - 6.9   Granulocyte percent 56.9  37 - 80 %G   RBC 4.25  4.04 - 5.48 M/uL   Hemoglobin 12.9  12.2 - 16.2 g/dL   HCT, POC 40.9  81.1 - 47.9 %   MCV 93.2  80 - 97 fL   MCH, POC 30.4  27 - 31.2 pg   MCHC 32.6  31.8 - 35.4 g/dL   RDW, POC 91.4     Platelet Count, POC 311  142 - 424 K/uL   MPV 9.2  0 - 99.8 fL    Glucose 93    Assessment & Plan:  Patient much improved after 2 treatments. Her CBC is normal her chest x-ray is normal we'll treat with albuterol inhaler and 6 day taper of prednisone. She had gestational diabetes when she was pregnant but not at present she is breast-feeding she was given a handout about asthma. She is to return to clinic with any worsening of symptoms

## 2012-04-02 MED ORDER — AMPHOTERICIN B 50 MG IJ SOLR
INTRAMUSCULAR | Status: AC
  Filled 2012-04-02: qty 50

## 2013-12-11 ENCOUNTER — Encounter (HOSPITAL_COMMUNITY): Payer: Self-pay | Admitting: Emergency Medicine

## 2013-12-11 ENCOUNTER — Emergency Department (HOSPITAL_COMMUNITY)
Admission: EM | Admit: 2013-12-11 | Discharge: 2013-12-11 | Disposition: A | Discharge status: Home / Self Care | Payer: BC Managed Care – PPO | Attending: Emergency Medicine | Admitting: Emergency Medicine

## 2013-12-11 DIAGNOSIS — E119 Type 2 diabetes mellitus without complications: Secondary | ICD-10-CM | POA: Insufficient documentation

## 2013-12-11 DIAGNOSIS — K0889 Other specified disorders of teeth and supporting structures: Secondary | ICD-10-CM

## 2013-12-11 DIAGNOSIS — K029 Dental caries, unspecified: Secondary | ICD-10-CM | POA: Insufficient documentation

## 2013-12-11 DIAGNOSIS — Z79899 Other long term (current) drug therapy: Secondary | ICD-10-CM | POA: Insufficient documentation

## 2013-12-11 DIAGNOSIS — K089 Disorder of teeth and supporting structures, unspecified: Secondary | ICD-10-CM | POA: Insufficient documentation

## 2013-12-11 DIAGNOSIS — Z8719 Personal history of other diseases of the digestive system: Secondary | ICD-10-CM | POA: Insufficient documentation

## 2013-12-11 DIAGNOSIS — IMO0002 Reserved for concepts with insufficient information to code with codable children: Secondary | ICD-10-CM | POA: Insufficient documentation

## 2013-12-11 MED ORDER — HYDROCODONE-ACETAMINOPHEN 5-325 MG PO TABS: 1.0000 | ORAL_TABLET | ORAL | Status: DC | PRN

## 2013-12-11 NOTE — Discharge Instructions (Signed)
Dental Care and Dentist Visits Dental care supports good overall health. Regular dental visits can also help you avoid dental pain, bleeding, infection, and other more serious health problems in the future. It is important to keep the mouth healthy because diseases in the teeth, gums, and other oral tissues can spread to other areas of the body. Some problems, such as diabetes, heart disease, and pre-term labor have been associated with poor oral health.  See your dentist every 6 months. If you experience emergency problems such as a toothache or broken tooth, go to the dentist right away. If you see your dentist regularly, you may catch problems early. It is easier to be treated for problems in the early stages.  WHAT TO EXPECT AT A DENTIST VISIT  Your dentist will look for many common oral health problems and recommend proper treatment. At your regular dental visit, you can expect:  Gentle cleaning of the teeth and gums. This includes scraping and polishing. This helps to remove the sticky substance around the teeth and gums (plaque). Plaque forms in the mouth shortly after eating. Over time, plaque hardens on the teeth as tartar. If tartar is not removed regularly, it can cause problems. Cleaning also helps remove stains.  Periodic X-rays. These pictures of the teeth and supporting bone will help your dentist assess the health of your teeth.  Periodic fluoride treatments. Fluoride is a natural mineral shown to help strengthen teeth. Fluoride treatmentinvolves applying a fluoride gel or varnish to the teeth. It is most commonly done in children.  Examination of the mouth, tongue, jaws, teeth, and gums to look for any oral health problems, such as:  Cavities (dental caries). This is decay on the tooth caused by plaque, sugar, and acid in the mouth. It is best to catch a cavity when it is small.  Inflammation of the gums caused by plaque buildup (gingivitis).  Problems with the mouth or malformed  or misaligned teeth.  Oral cancer or other diseases of the soft tissues or jaws. KEEP YOUR TEETH AND GUMS HEALTHY For healthy teeth and gums, follow these general guidelines as well as your dentist's specific advice:  Have your teeth professionally cleaned at the dentist every 6 months.  Brush twice daily with a fluoride toothpaste.  Floss your teeth daily.  Ask your dentist if you need fluoride supplements, treatments, or fluoride toothpaste.  Eat a healthy diet. Reduce foods and drinks with added sugar.  Avoid smoking. TREATMENT FOR ORAL HEALTH PROBLEMS If you have oral health problems, treatment varies depending on the conditions present in your teeth and gums.  Your caregiver will most likely recommend good oral hygiene at each visit.  For cavities, gingivitis, or other oral health disease, your caregiver will perform a procedure to treat the problem. This is typically done at a separate appointment. Sometimes your caregiver will refer you to another dental specialist for specific tooth problems or for surgery. SEEK IMMEDIATE DENTAL CARE IF:  You have pain, bleeding, or soreness in the gum, tooth, jaw, or mouth area.  A permanent tooth becomes loose or separated from the gum socket.  You experience a blow or injury to the mouth or jaw area. Document Released: 05/15/2011 Document Revised: 11/25/2011 Document Reviewed: 05/15/2011 [facility] Patient Information 2014 [place], [place].  Dolor dental (Dental Pain) Usted ha consultado con el profesional que lo asiste porque sufre dolor en un diente. ste puede estar ocasionado en caries dentales, las que exponen el nervio del diente al [place], y a  temperaturas fras o calientes, lo que produce dolor. Puede provenir de una infeccin o absceso (tambin denominado fornculo) alrededor del diente, que tambin suele ser causado por caries dentales. Esto produce el dolor que usted siente. DIAGNSTICO El profesional que lo asiste puede  diagnosticar el problema con un examen bucal. TRATAMIENTO  Si la causa es una infeccin, puede tratarse con antibiticos (medicamentos que combaten grmenes) y Chief Strategy Officer que Scientist, research (medical), como le ha recetado el profesional que lo asiste. Tome la medicacin como se le indic.  Utilice los medicamentos de venta libre o de prescripcin para Conservation officer, historic buildings, Health and safety inspector o la [place], segn se lo indique el profesional que lo asiste.  Debido a que Scientist, research (medical) generalmente es causado por infeccin o por una enfermedad dental, es aconsejable que acuda a su dentista lo antes posible para que le realice un tratamiento ms profundo. SOLICITE ATENCIN MDICA DE INMEDIATO SI: El examen y el tratamiento que recibi hoy fue indicado slo para hacer frente a la Doctor, general practice. No constituye un sustituto de la atencin mdica o Hospital doctor. Si su problema empeora o surgen nuevos sntomas (problemas) y no puede concertar una cita con su odontlogo para un pronto seguimiento, llame o acuda nuevamente a Dietitian. SOLICITE ATENCIN MDICA DE INMEDIATO SI:  Tiene fiebre.  Presenta enrojecimiento e hinchazn en el rostro, la mandbula o el cuello.  No puede abrir Equities trader.  Siente un dolor intenso que no puede ser [employer]. EST SEGURO QUE:   Comprende las instrucciones para el alta mdica.  Controlar su enfermedad.  Solicitar atencin mdica de inmediato segn las indicaciones. Document Released: 09/02/2005 Document Revised: 11/25/2011 [facility] Patient Information 2014 [place], [place].

## 2013-12-11 NOTE — ED Provider Notes (Signed)
CSN: [id number]     Arrival date & time 12/11/13  1519 History   First MD Initiated Contact with Patient 12/11/13 1540     Chief Complaint  Patient presents with  . Dental Pain     (Consider location/radiation/quality/duration/timing/severity/associated sxs/prior Treatment) HPI  Patient presents to the emergency department with a dental complaint. Symptoms began 3 days ago. The patient has tried to alleviate pain with ibuprofen and Amoxicillin.  Pain rated at a 10/10, characterized as throbbing in nature and located left lower molar. Patient denies fever, night sweats, chills, difficulty swallowing or opening mouth, SOB, nuchal rigidity or decreased ROM of neck.  Patient does not have a dentist and requests a resource guide at discharge. She saw a provider a few days ago and was started on abx and ibuprofen but it is not helping the pain. She is going to see a dentist on Monday   Past Medical History  Diagnosis Date  . GERD (gastroesophageal reflux disease)     no meds  . Diabetes mellitus     gestational-takes glyburide-fbs this am -68 instructed to hold glyburide day of c section   Past Surgical History  Procedure Laterality Date  . Tonsillectomy      age 33  . Cesarean section  04/26/2011    Procedure: CESAREAN SECTION;  Surgeon: Sylvia Jude, MD;  Location: [facility];  Service: Gynecology;  Laterality: N/A;   Family History  Problem Relation Age of Onset  . Hypertension Mother   . Heart disease Father   . Diabetes Father   . Diabetes Paternal Grandfather    History  Substance Use Topics  . Smoking status: Never Smoker   . Smokeless tobacco: Never Used  . Alcohol Use: No   OB History   Grav Para Term Preterm Abortions TAB SAB Ect Mult Living   3 1 1  0 2 0 2 0 1 2     Review of Systems  The patient denies anorexia, fever, weight loss,, vision loss, decreased hearing, hoarseness, chest pain, syncope, dyspnea on exertion, peripheral edema, balance deficits, hemoptysis,  abdominal pain, melena, hematochezia, severe indigestion/heartburn, hematuria, incontinence, genital sores, muscle weakness, suspicious skin lesions, transient blindness, difficulty walking, depression, unusual weight change, abnormal bleeding, enlarged lymph nodes, angioedema, and breast masses.    Allergies  Review of patient's allergies indicates no known allergies.  Home Medications   Current Outpatient Rx  Name  Route  Sig  Dispense  Refill  . ACCU-CHEK AVIVA PLUS test strip      USE TO TEST BLOOD SUGAR FOUR TIMES DAILY   100 strip   3   . EXPIRED: albuterol (PROVENTIL HFA;VENTOLIN HFA) 108 (90 BASE) MCG/ACT inhaler   Inhalation   Inhale 2 puffs into the lungs every 4 (four) hours as needed for wheezing (cough, shortness of breath or wheezing.).   1 Inhaler   1   . Fe Fum-FePoly-FA-Vit C-Vit B3 (INTEGRA F) 125-1 MG CAPS   Oral   Take 1 tablet by mouth daily.   30 capsule   1   . glyBURIDE (DIABETA) 2.5 MG tablet   Oral   Take 2.5 mg by mouth daily with breakfast.           . HYDROcodone-acetaminophen (NORCO/VICODIN) 5-325 MG per tablet   Oral   Take 1-2 tablets by mouth every 4 (four) hours as needed.   20 tablet   0   . ibuprofen (ADVIL,MOTRIN) 600 MG tablet   Oral   Take 1  tablet (600 mg total) by mouth every 6 (six) hours as needed.   30 tablet   0   . oxyCODONE-acetaminophen (PERCOCET) 5-325 MG per tablet   Oral   Take 1-2 tablets by mouth every 4 (four) hours as needed.           . predniSONE (DELTASONE) 10 MG tablet      Takes 6 pills for one day 5 pills for one day 4 pills for one day 3 pills for one day 2 pills for one day and one pill for one day.   21 tablet   0   . prenatal vitamin w/FE, FA (PRENATAL 1 + 1) 27-1 MG TABS   Oral   Take 1 tablet by mouth at bedtime.            BP 124/83  Pulse 81  Temp(Src) 99 F (37.2 C) (Oral)  Resp 18  SpO2 100% Physical Exam  Constitutional: She appears well-developed and well-nourished. No  distress.  HENT:  Head: Normocephalic and atraumatic.  Mouth/Throat: Uvula is midline, oropharynx is clear and moist and mucous membranes are normal. Normal dentition. Dental caries (Pts tooth shows no obvious abscess but moderate to severe tenderness to palpation of marked tooth) present. No uvula swelling.    Eyes: Pupils are equal, round, and reactive to light.  Neck: Trachea normal, normal range of motion and full passive range of motion without pain. Neck supple.  Cardiovascular: Normal rate, regular rhythm, normal heart sounds and normal pulses.   Pulmonary/Chest: Effort normal and breath sounds normal. No respiratory distress. Chest wall is not dull to percussion. She exhibits no tenderness, no crepitus, no edema, no deformity and no retraction.  Abdominal: Normal appearance.  Musculoskeletal: Normal range of motion.  Neurological: She is alert. She has normal strength.  Skin: Skin is warm, dry and intact. She is not diaphoretic.  Psychiatric: She has a normal mood and affect. Her speech is normal. Cognition and memory are normal.      ED Course  NERVE BLOCK Date/Time: 12/11/2013 4:07 PM Performed by: Sylvia Best Authorized by: Sylvia Best Consent: Verbal consent obtained. Risks and benefits: risks, benefits and alternatives were discussed Consent given by: patient Indications: pain relief Body area: face/mouth (left lower molar) Laterality: left Patient sedated: no Local anesthetic: lidocaine 2% without epinephrine Anesthetic total: 2 ml Outcome: pain improved Patient tolerance: Patient tolerated the procedure well with no immediate complications.   (including critical care time) Labs Review Labs Reviewed - No data to display Imaging Review No results found.   EKG Interpretation None      MDM   Final diagnoses:  Pain, dental      Patient has dental pain. No emergent s/sx's present. Patent airway. No trismus.  Will be given pain medication. Pt  already on abx I discussed the need to call dentist within 24/48 hours for follow-up. Dental referral given. Return to ED precautions given.  Pt voiced understanding and has agreed to follow-up.   33 y.o.Sylvia Best's evaluation in the Emergency Department is complete. It has been determined that no acute conditions requiring further emergency intervention are present at this time. The patient/guardian have been advised of the diagnosis and plan. We have discussed signs and symptoms that warrant return to the ED, such as changes or worsening in symptoms.  Vital signs are stable at discharge. Filed Vitals:   12/11/13 1542  BP: 124/83  Pulse: 81  Temp: 99 F (37.2 C)  Resp: 18  Patient/guardian has voiced understanding and agreed to follow-up with the PCP or specialist.     Sylvia Mako, PA-C 12/11/13 1607

## 2013-12-11 NOTE — ED Notes (Signed)
Pt c/o dental pain for several days, worsening today.

## 2013-12-11 NOTE — ED Provider Notes (Signed)
Medical screening examination/treatment/procedure(s) were performed by non-physician practitioner and as supervising physician I was immediately available for consultation/collaboration.    Kathalene Frames, MD 12/11/13 [phone]

## 2014-07-18 ENCOUNTER — Encounter (HOSPITAL_COMMUNITY): Payer: Self-pay | Admitting: Emergency Medicine

## 2014-11-04 ENCOUNTER — Encounter (HOSPITAL_COMMUNITY): Payer: Self-pay | Admitting: *Deleted

## 2014-11-04 ENCOUNTER — Emergency Department (HOSPITAL_COMMUNITY)
Admission: EM | Admit: 2014-11-04 | Discharge: 2014-11-04 | Disposition: A | Discharge status: Home / Self Care | Payer: PRIVATE HEALTH INSURANCE | Source: Home / Self Care | Attending: Emergency Medicine | Admitting: Emergency Medicine

## 2014-11-04 DIAGNOSIS — R42 Dizziness and giddiness: Secondary | ICD-10-CM

## 2014-11-04 LAB — POCT I-STAT, CHEM 8
BUN: 7 mg/dL (ref 6–23)
CALCIUM ION: 1.24 mmol/L — AB (ref 1.12–1.23)
Chloride: 100 mmol/L (ref 96–112)
Creatinine, Ser: 0.5 mg/dL (ref 0.50–1.10)
Glucose, Bld: 158 mg/dL — ABNORMAL HIGH (ref 70–99)
HCT: 37 % (ref 36.0–46.0)
HEMOGLOBIN: 12.6 g/dL (ref 12.0–15.0)
Potassium: 3.4 mmol/L — ABNORMAL LOW (ref 3.5–5.1)
Sodium: 139 mmol/L (ref 135–145)
TCO2: 23 mmol/L (ref 0–100)

## 2014-11-04 NOTE — Discharge Instructions (Signed)
Your blood sugar is elevated but our single test is not enough to say that you have diabetes. You should see a doctor and get further tests done to evaluate you for Diabetes.   Please drink lots of fluids, eat leafy green vegetables (for potassium) and eat 3 meals with 2 snacks consistently. \   Dizziness Dizziness is a common problem. It is a feeling of unsteadiness or light-headedness. You may feel like you are about to faint. Dizziness can lead to injury if you stumble or fall. A person of any age group can suffer from dizziness, but dizziness is more common in older adults. CAUSES  Dizziness can be caused by many different things, including:  Middle ear problems.  Standing for too long.  Infections.  An allergic reaction.  Aging.  An emotional response to something, such as the sight of blood.  Side effects of medicines.  Tiredness.  Problems with circulation or blood pressure.  Excessive use of alcohol or medicines, or illegal drug use.  Breathing too fast (hyperventilation).  An irregular heart rhythm (arrhythmia).  A low red blood cell count (anemia).  Pregnancy.  Vomiting, diarrhea, fever, or other illnesses that cause body fluid loss (dehydration).  Diseases or conditions such as Parkinson's disease, high blood pressure (hypertension), diabetes, and thyroid problems.  Exposure to extreme heat. DIAGNOSIS  Your health care provider will ask about your symptoms, perform a physical exam, and perform an electrocardiogram (ECG) to record the electrical activity of your heart. Your health care provider may also perform other heart or blood tests to determine the cause of your dizziness. These may include:  Transthoracic echocardiogram (TTE). During echocardiography, sound waves are used to evaluate how blood flows through your heart.  Transesophageal echocardiogram (TEE).  Cardiac monitoring. This allows your health care provider to monitor your heart rate and  rhythm in real time.  Holter monitor. This is a portable device that records your heartbeat and can help diagnose heart arrhythmias. It allows your health care provider to track your heart activity for several days if needed.  Stress tests by exercise or by giving medicine that makes the heart beat faster. TREATMENT  Treatment of dizziness depends on the cause of your symptoms and can vary greatly. HOME CARE INSTRUCTIONS   Drink enough fluids to keep your urine clear or pale yellow. This is especially important in very hot weather. In older adults, it is also important in cold weather.  Take your medicine exactly as directed if your dizziness is caused by medicines. When taking blood pressure medicines, it is especially important to get up slowly.  Rise slowly from chairs and steady yourself until you feel okay.  In the morning, first sit up on the side of the bed. When you feel okay, stand slowly while holding onto something until you know your balance is fine.  Move your legs often if you need to stand in one place for a long time. Tighten and relax your muscles in your legs while standing.  Have someone stay with you for 1-2 days if dizziness continues to be a problem. Do this until you feel you are well enough to stay alone. Have the person call your health care provider if he or she notices changes in you that are concerning.  Do not drive or use heavy machinery if you feel dizzy.  Do not drink alcohol. SEEK IMMEDIATE MEDICAL CARE IF:   Your dizziness or light-headedness gets worse.  You feel nauseous or vomit.  You  have problems talking, walking, or using your arms, hands, or legs.  You feel weak.  You are not thinking clearly or you have trouble forming sentences. It may take a friend or family member to notice this.  You have chest pain, abdominal pain, shortness of breath, or sweating.  Your vision changes.  You notice any bleeding.  You have side effects from  medicine that seems to be getting worse rather than better. MAKE SURE YOU:   Understand these instructions.  Will watch your condition.  Will get help right away if you are not doing well or get worse. Document Released: 02/26/2001 Document Revised: 09/07/2013 Document Reviewed: 03/22/2011 [facility] Patient Information 2015 [employer], [place]. This information is not intended to replace advice given to you by your health care provider. Make sure you discuss any questions you have with your health care provider.

## 2014-11-04 NOTE — ED Notes (Addendum)
Pt   Reports     yest      She developed   Some  dizzyness      No  Vomiting   denys  Any  Headache          Denys  Any    Vomiting     - she  Is  Sitting             Upright  On  Exam table       Speaking in  Complete  sentances   She  Ambulated  To  Room  With   A  Steady  Fluid  Gait    History  Gestational  Diabetes

## 2014-11-04 NOTE — ED Provider Notes (Signed)
CSN: [id number]     Arrival date & time 11/04/14  1057 History   First MD Initiated Contact with Patient 11/04/14 1122     Chief Complaint  Patient presents with  . Dizziness   (Consider location/radiation/quality/duration/timing/severity/associated sxs/prior Treatment) HPI   Visit conducted with the assistance of pacific interpreter # [phone], arabic interpreter.   34 y/o arabic speaking female here with c/o 1 day duration of intermittent dizziness, feeling of her ears opening and closing and darkening of her vision. She states that the symptoms come on suddenly, occur together, and last for a few minutes at a time. She denies and syncopal event or any wekaness in any are of the her bocy during this time. She was sitting at work when it first occurred and it happened again this am. She denies room spinning senasation and when asked to describe the event she describes the darkening of her vision. She also endorses recent oral numbness and tingling which has been present for several days.   She also c/o stable long term intermittent back pain and joint pains (states all of her joints)  which are unchanged today. She denies bowel or bladder dysf, saddle anesthesia, and weakness of her LE. She does not have a PCP.   Past Medical History  Diagnosis Date  . GERD (gastroesophageal reflux disease)     no meds  . Diabetes mellitus     gestational-takes glyburide-fbs this am -68 instructed to hold glyburide day of c section   Past Surgical History  Procedure Laterality Date  . Tonsillectomy      age 8  . Cesarean section  04/26/2011    Procedure: CESAREAN SECTION;  Surgeon: Donnamae Jude, MD;  Location: [facility];  Service: Gynecology;  Laterality: N/A;   Family History  Problem Relation Age of Onset  . Hypertension Mother   . Heart disease Father   . Diabetes Father   . Diabetes Paternal Grandfather    History  Substance Use Topics  . Smoking status: Never Smoker   . Smokeless tobacco:  Never Used  . Alcohol Use: No   OB History    Gravida Para Term Preterm AB TAB SAB Ectopic Multiple Living   3 1 1  0 2 0 2 0 1 2     Review of Systems   Per HPI  Allergies  Review of patient's allergies indicates no known allergies.  Home Medications   Prior to Admission medications   Medication Sig Start Date End Date Taking? Authorizing Provider  ACCU-CHEK AVIVA PLUS test strip USE TO TEST BLOOD SUGAR FOUR TIMES DAILY 06/08/11   Donnamae Jude, MD  albuterol (PROVENTIL HFA;VENTOLIN HFA) 108 (90 BASE) MCG/ACT inhaler Inhale 2 puffs into the lungs every 4 (four) hours as needed for wheezing (cough, shortness of breath or wheezing.). 03/06/12 03/06/13  Darlyne Russian, MD  Fe Fum-FePoly-FA-Vit C-Vit B3 (INTEGRA F) 125-1 MG CAPS Take 1 tablet by mouth daily. 06/06/11   Gwen Pounds, CNM  glyBURIDE (DIABETA) 2.5 MG tablet Take 2.5 mg by mouth daily with breakfast.      Historical Provider, MD  HYDROcodone-acetaminophen (NORCO/VICODIN) 5-325 MG per tablet Take 1-2 tablets by mouth every 4 (four) hours as needed. 12/11/13   Tiffany Marilu Favre, PA-C  ibuprofen (ADVIL,MOTRIN) 600 MG tablet Take 1 tablet (600 mg total) by mouth every 6 (six) hours as needed. 06/06/11   Gwen Pounds, CNM  oxyCODONE-acetaminophen (PERCOCET) 5-325 MG per tablet Take 1-2 tablets by mouth every  4 (four) hours as needed.      Historical Provider, MD  predniSONE (DELTASONE) 10 MG tablet Takes 6 pills for one day 5 pills for one day 4 pills for one day 3 pills for one day 2 pills for one day and one pill for one day. 03/06/12   Darlyne Russian, MD  prenatal vitamin w/FE, FA (PRENATAL 1 + 1) 27-1 MG TABS Take 1 tablet by mouth at bedtime.      Historical Provider, MD   BP 123/71 mmHg  Pulse 92  Temp(Src) 99.7 F (37.6 C) (Oral)  Resp 18  SpO2 100%  LMP 10/19/2014 Physical Exam  Constitutional: She is oriented to person, place, and time. She appears well-developed and well-nourished. No distress.  HENT:  Head:  Normocephalic and atraumatic.  Right Ear: Tympanic membrane normal.  Left Ear: Tympanic membrane normal.  Mouth/Throat: Oropharynx is clear and moist.  Neck: Neck supple.  Cardiovascular: Normal rate, regular rhythm and normal heart sounds.  Exam reveals no friction rub.   No murmur heard. Pulmonary/Chest: Effort normal and breath sounds normal. No respiratory distress. She has no wheezes.  Musculoskeletal:  No bony tenderness of lower back at site of identified pain.   Neurological: She is alert and oriented to person, place, and time. She has normal strength. No cranial nerve deficit. She exhibits normal muscle tone. She displays a negative Romberg sign. Coordination and gait normal.  Skin: Skin is warm and dry. No rash noted.  Psychiatric: She has a normal mood and affect.  Nursing note and vitals reviewed.      ED Course  Procedures (including critical care time) Labs Review Labs Reviewed  POCT I-STAT, CHEM 8 - Abnormal; Notable for the following:    Potassium 3.4 (*)    Glucose, Bld 158 (*)    Calcium, Ion 1.24 (*)    All other components within normal limits    Imaging Review No results found.   MDM   1. Dizziness    34 y/o female here with dizziness without clear etiology. The two most likely possibilities include fluctuating glucose levels form undiagnosed DM2 or volume depletion. Unfortunately her glucose level was elevated but not diagnostic today and her orthostatic VS were normal making volume less likely. On her neuro exam she did not have any worrisome findings. We emphasized the importance of establishing care and evaluation for DM2 to her, discussed supportive care, and reviewed red flags in detail. She has mild potassium and calcium abnormalities which are likely not the etiology of her dizziness.   She has chronic back and joints pains without red flags. Again, we encouraged a PCP appt.   We are forwarding her chart to our clinical social worker to discuss  establishing a PCP and she has instructions to call on Monday.   Pt seen and discussed with Dr. Bridgett Larsson and he agrees with the plan as discussed above.      Timmothy Euler, MD 11/04/14 1224

## 2014-11-14 ENCOUNTER — Emergency Department (HOSPITAL_COMMUNITY)
Admission: EM | Admit: 2014-11-14 | Discharge: 2014-11-14 | Disposition: A | Discharge status: Home / Self Care | Payer: PRIVATE HEALTH INSURANCE | Source: Home / Self Care | Attending: Emergency Medicine | Admitting: Emergency Medicine

## 2014-11-14 ENCOUNTER — Encounter (HOSPITAL_COMMUNITY): Payer: Self-pay

## 2014-11-14 DIAGNOSIS — R55 Syncope and collapse: Secondary | ICD-10-CM

## 2014-11-14 LAB — GLUCOSE, CAPILLARY: GLUCOSE-CAPILLARY: 138 mg/dL — AB (ref 70–99)

## 2014-11-14 NOTE — ED Notes (Signed)
C/o continued dizziness, HA, numb around mouth since 2-19 . Using BCP for contraception . Has set of twins (35 yr old)

## 2014-11-14 NOTE — Discharge Instructions (Signed)
You are experiencing something called vagaling. Make sure you are drinking lots of fluids - this means 8 water bottles a day. Please cook with salt. When you are standing, keep 1 foot propped up. Change feet every 10 minutes. Please follow-up as scheduled April 18 at 9 AM with your new primary care physician.

## 2014-11-14 NOTE — ED Provider Notes (Signed)
CSN: [id number]     Arrival date & time 11/14/14  1133 History   First MD Initiated Contact with Patient 11/14/14 1256     Chief Complaint  Patient presents with  . Dizziness   (Consider location/radiation/quality/duration/timing/severity/associated sxs/prior Treatment) HPI  She is a 34 year old woman here for follow-up of dizziness. She was seen here 10 days ago for episodes of dizziness described as decreased vision and head heaviness. These are associated with perioral numbness. She does have a new patient appointment scheduled for April 18. She states these episodes have continued. She is having multiple a day. They typically last a few seconds to minutes. No syncopal episodes in the last 10 days. She has noticed that they tend to occur if she has been standing for a long time.  Past Medical History  Diagnosis Date  . GERD (gastroesophageal reflux disease)     no meds  . Diabetes mellitus     gestational-takes glyburide-fbs this am -68 instructed to hold glyburide day of c section   Past Surgical History  Procedure Laterality Date  . Tonsillectomy      age 89  . Cesarean section  04/26/2011    Procedure: CESAREAN SECTION;  Surgeon: Donnamae Jude, MD;  Location: [facility];  Service: Gynecology;  Laterality: N/A;   Family History  Problem Relation Age of Onset  . Hypertension Mother   . Heart disease Father   . Diabetes Father   . Diabetes Paternal Grandfather    History  Substance Use Topics  . Smoking status: Never Smoker   . Smokeless tobacco: Never Used  . Alcohol Use: No   OB History    Gravida Para Term Preterm AB TAB SAB Ectopic Multiple Living   3 1 1  0 2 0 2 0 1 2     Review of Systems As in history of present illness Allergies  Review of patient's allergies indicates no known allergies.  Home Medications   Prior to Admission medications   Medication Sig Start Date End Date Taking? Authorizing Provider  ACCU-CHEK AVIVA PLUS test strip USE TO TEST BLOOD SUGAR  FOUR TIMES DAILY 06/08/11   Donnamae Jude, MD  albuterol (PROVENTIL HFA;VENTOLIN HFA) 108 (90 BASE) MCG/ACT inhaler Inhale 2 puffs into the lungs every 4 (four) hours as needed for wheezing (cough, shortness of breath or wheezing.). 03/06/12 03/06/13  Darlyne Russian, MD  Fe Fum-FePoly-FA-Vit C-Vit B3 (INTEGRA F) 125-1 MG CAPS Take 1 tablet by mouth daily. 06/06/11   Gwen Pounds, CNM  glyBURIDE (DIABETA) 2.5 MG tablet Take 2.5 mg by mouth daily with breakfast.      Historical Provider, MD  HYDROcodone-acetaminophen (NORCO/VICODIN) 5-325 MG per tablet Take 1-2 tablets by mouth every 4 (four) hours as needed. 12/11/13   Tiffany Marilu Favre, PA-C  ibuprofen (ADVIL,MOTRIN) 600 MG tablet Take 1 tablet (600 mg total) by mouth every 6 (six) hours as needed. 06/06/11   Gwen Pounds, CNM  oxyCODONE-acetaminophen (PERCOCET) 5-325 MG per tablet Take 1-2 tablets by mouth every 4 (four) hours as needed.      Historical Provider, MD  predniSONE (DELTASONE) 10 MG tablet Takes 6 pills for one day 5 pills for one day 4 pills for one day 3 pills for one day 2 pills for one day and one pill for one day. 03/06/12   Darlyne Russian, MD  prenatal vitamin w/FE, FA (PRENATAL 1 + 1) 27-1 MG TABS Take 1 tablet by mouth at bedtime.  Historical Provider, MD   BP 103/71 mmHg  Pulse 76  Temp(Src) 99 F (37.2 C) (Oral)  Resp 16  SpO2 100%  LMP 10/19/2014 Physical Exam  Constitutional: She is oriented to person, place, and time. She appears well-developed and well-nourished. No distress.  Cardiovascular: Normal rate, regular rhythm and normal heart sounds.   No murmur heard. Pulmonary/Chest: Effort normal and breath sounds normal. No respiratory distress. She has no wheezes. She has no rales.  Neurological: She is alert and oriented to person, place, and time.    ED Course  Procedures (including critical care time) Labs Review Labs Reviewed  GLUCOSE, CAPILLARY - Abnormal; Notable for the following:    Glucose-Capillary  138 (*)    All other components within normal limits    Imaging Review No results found.   MDM   1. Vaso-vagal reaction    CBG is again elevated but not diagnostic. Her orthostatics are negative, but her blood pressure does tend to run on the lower side. She is likely experiencing vasovagal episodes. Discussed adequate water intake, salt intake, and not locking her knees. Work note provided. She will follow-up on April 18 with her new physician.     Melony Overly, MD 11/14/14 1346

## 2014-11-17 NOTE — ED Notes (Signed)
Discussed with dr Marily Memos.  Epic not allowing a different note with no restrictions without altering the documentation of first note.  reviewed with staff and physician.  Will scan a copy of origonal into the system.

## 2015-08-19 ENCOUNTER — Inpatient Hospital Stay (HOSPITAL_COMMUNITY)
Admission: AD | Admit: 2015-08-19 | Discharge: 2015-08-19 | Disposition: A | Discharge status: Home / Self Care | Payer: Medicaid Other | Source: Ambulatory Visit | Attending: Obstetrics & Gynecology | Admitting: Obstetrics & Gynecology

## 2015-08-19 ENCOUNTER — Inpatient Hospital Stay (HOSPITAL_COMMUNITY): Payer: Medicaid Other

## 2015-08-19 ENCOUNTER — Encounter (HOSPITAL_COMMUNITY): Payer: Self-pay | Admitting: *Deleted

## 2015-08-19 DIAGNOSIS — Z3A01 Less than 8 weeks gestation of pregnancy: Secondary | ICD-10-CM | POA: Insufficient documentation

## 2015-08-19 DIAGNOSIS — O24415 Gestational diabetes mellitus in pregnancy, controlled by oral hypoglycemic drugs: Secondary | ICD-10-CM | POA: Diagnosis not present

## 2015-08-19 DIAGNOSIS — O26899 Other specified pregnancy related conditions, unspecified trimester: Secondary | ICD-10-CM

## 2015-08-19 DIAGNOSIS — R109 Unspecified abdominal pain: Secondary | ICD-10-CM | POA: Diagnosis present

## 2015-08-19 DIAGNOSIS — O26891 Other specified pregnancy related conditions, first trimester: Secondary | ICD-10-CM | POA: Insufficient documentation

## 2015-08-19 DIAGNOSIS — O9989 Other specified diseases and conditions complicating pregnancy, childbirth and the puerperium: Secondary | ICD-10-CM

## 2015-08-19 LAB — CBC WITH DIFFERENTIAL/PLATELET
Basophils Absolute: 0 10*3/uL (ref 0.0–0.1)
Basophils Relative: 0 %
Eosinophils Absolute: 0.5 10*3/uL (ref 0.0–0.7)
Eosinophils Relative: 6 %
HCT: 32.9 % — ABNORMAL LOW (ref 36.0–46.0)
HEMOGLOBIN: 11.4 g/dL — AB (ref 12.0–15.0)
Lymphocytes Relative: 34 %
Lymphs Abs: 3.2 10*3/uL (ref 0.7–4.0)
MCH: 31.1 pg (ref 26.0–34.0)
MCHC: 34.7 g/dL (ref 30.0–36.0)
MCV: 89.6 fL (ref 78.0–100.0)
MONO ABS: 0.5 10*3/uL (ref 0.1–1.0)
MONOS PCT: 6 %
NEUTROS ABS: 5 10*3/uL (ref 1.7–7.7)
Neutrophils Relative %: 54 %
Platelets: 212 10*3/uL (ref 150–400)
RBC: 3.67 MIL/uL — ABNORMAL LOW (ref 3.87–5.11)
RDW: 13.1 % (ref 11.5–15.5)
WBC: 9.3 10*3/uL (ref 4.0–10.5)

## 2015-08-19 LAB — URINALYSIS, ROUTINE W REFLEX MICROSCOPIC
BILIRUBIN URINE: NEGATIVE
Glucose, UA: NEGATIVE mg/dL
Ketones, ur: NEGATIVE mg/dL
Leukocytes, UA: NEGATIVE
Nitrite: NEGATIVE
PROTEIN: NEGATIVE mg/dL
Specific Gravity, Urine: 1.01 (ref 1.005–1.030)
pH: 5.5 (ref 5.0–8.0)

## 2015-08-19 LAB — URINE MICROSCOPIC-ADD ON: WBC, UA: NONE SEEN WBC/hpf (ref 0–5)

## 2015-08-19 LAB — POCT PREGNANCY, URINE: PREG TEST UR: POSITIVE — AB

## 2015-08-19 LAB — HCG, QUANTITATIVE, PREGNANCY: hCG, Beta Chain, Quant, S: 4934 m[IU]/mL — ABNORMAL HIGH (ref <5)

## 2015-08-19 MED ORDER — PRENATAL VITAMINS PLUS 27-1 MG PO TABS: 1.0000 | ORAL_TABLET | Freq: Every day | ORAL | Status: DC

## 2015-08-19 NOTE — MAU Provider Note (Signed)
History     CSN: [id number]  Arrival date and time: 08/19/15 2023   First Provider Initiated Contact with Patient 08/19/15 2109      No chief complaint on file.  HPI Sylvia Best 34 y.o. [id number] @[redacted]w[redacted]d  presents to MAU complaining of abdominal pain in setting of positive home pregnancy test.  She has had abdominal pain off and on for one week.  It is 7/10, located in mid lower abdomen.  Denies nausea, vomiting, dysuria, fever, weakness, vaginal bleeding.   OB History    Gravida Para Term Preterm AB TAB SAB Ectopic Multiple Living   4 1 1  0 2 0 2 0 1 2      Past Medical History  Diagnosis Date  . GERD (gastroesophageal reflux disease)     no meds  . Diabetes mellitus     gestational-takes glyburide-fbs this am -68 instructed to hold glyburide day of c section    Past Surgical History  Procedure Laterality Date  . Tonsillectomy      age 35  . Cesarean section  04/26/2011    Procedure: CESAREAN SECTION;  Surgeon: Donnamae Jude, MD;  Location: [facility];  Service: Gynecology;  Laterality: N/A;    Family History  Problem Relation Age of Onset  . Hypertension Mother   . Heart disease Father   . Diabetes Father   . Diabetes Paternal Grandfather     Social History  Substance Use Topics  . Smoking status: Never Smoker   . Smokeless tobacco: Never Used  . Alcohol Use: No    Allergies: No Known Allergies  Prescriptions prior to admission  Medication Sig Dispense Refill Last Dose  . ACCU-CHEK AVIVA PLUS test strip USE TO TEST BLOOD SUGAR FOUR TIMES DAILY 100 strip 3 Unknown at Unknown time  . albuterol (PROVENTIL HFA;VENTOLIN HFA) 108 (90 BASE) MCG/ACT inhaler Inhale 2 puffs into the lungs every 4 (four) hours as needed for wheezing (cough, shortness of breath or wheezing.). 1 Inhaler 1   . Fe Fum-FePoly-FA-Vit C-Vit B3 (INTEGRA F) 125-1 MG CAPS Take 1 tablet by mouth daily. 30 capsule 1 Unknown at Unknown time  . glyBURIDE (DIABETA) 2.5 MG tablet Take 2.5 mg by mouth daily  with breakfast.     Unknown at Unknown time  . HYDROcodone-acetaminophen (NORCO/VICODIN) 5-325 MG per tablet Take 1-2 tablets by mouth every 4 (four) hours as needed. 20 tablet 0 Unknown at Unknown time  . ibuprofen (ADVIL,MOTRIN) 600 MG tablet Take 1 tablet (600 mg total) by mouth every 6 (six) hours as needed. 30 tablet 0 Unknown at Unknown time  . oxyCODONE-acetaminophen (PERCOCET) 5-325 MG per tablet Take 1-2 tablets by mouth every 4 (four) hours as needed.     Unknown at Unknown time  . predniSONE (DELTASONE) 10 MG tablet Takes 6 pills for one day 5 pills for one day 4 pills for one day 3 pills for one day 2 pills for one day and one pill for one day. 21 tablet 0 Unknown at Unknown time  . prenatal vitamin w/FE, FA (PRENATAL 1 + 1) 27-1 MG TABS Take 1 tablet by mouth at bedtime.     Unknown at Unknown time    ROS.Pertinent ROS in HPI.  All other systems are negative.    Physical Exam   Blood pressure 117/75, pulse 81, temperature 98.6 F (37 C), temperature source Oral, resp. rate 16, height 5\' 2"  (1.575 m), weight 136 lb (61.689 kg), last menstrual period 07/07/2015, SpO2 99 %.  Physical Exam  Constitutional: She is oriented to person, place, and time. She appears well-developed and well-nourished. No distress.  HENT:  Head: Normocephalic and atraumatic.  Eyes: Conjunctivae and EOM are normal.  Neck: Normal range of motion. Neck supple.  Cardiovascular: Normal rate and normal heart sounds.   Respiratory: Effort normal. No respiratory distress.  GI: Soft. She exhibits no distension. There is tenderness.  Mildly tender to palpation across lower abdomen  Genitourinary:  Scant white physiologic discharge.  Cervix is smooth, pink.  No CMT.  No adnexal mass or tenderness.  Musculoskeletal: Normal range of motion.  Neurological: She is alert and oriented to person, place, and time.  Skin: Skin is warm and dry.  Psychiatric: She has a normal mood and affect. Her behavior is normal.     MAU Course  Procedures  MDM Ectopic workup ordered to eval abdominal pain in pregnancy.  Pt seen with her husband serving as Arabic interpreter when she does not understand.  She is offered telephone interpreter and declined.  US Ob Comp Less 14 Wks  08/19/2015  CLINICAL DATA:  Abdominal pain. EXAM: OBSTETRIC <14 WK [place] AND TRANSVAGINAL OB US TECHNIQUE: Both transabdominal and transvaginal ultrasound examinations were performed for complete evaluation of the gestation as well as the maternal uterus, adnexal regions, and pelvic cul-de-sac. Transvaginal technique was performed to assess early pregnancy. COMPARISON:  None. FINDINGS: Intrauterine gestational sac: Single Yolk sac:  Yes Embryo:  Yes Cardiac Activity: Yet Heart Rate: 108  bpm MSD:   mm    w     d CRL:  4  mm   6 w   1 d                  [place] EDC: 04/12/2016 Maternal uterus/adnexae: Normal IMPRESSION: Single living intrauterine gestation measuring 6 weeks 1 day by crown-rump length. Electronically Signed   By: Andreas Newport M.D.   On: 08/19/2015 22:09   US Ob Transvaginal  08/19/2015  CLINICAL DATA:  Abdominal pain. EXAM: OBSTETRIC <14 WK [place] AND TRANSVAGINAL OB US TECHNIQUE: Both transabdominal and transvaginal ultrasound examinations were performed for complete evaluation of the gestation as well as the maternal uterus, adnexal regions, and pelvic cul-de-sac. Transvaginal technique was performed to assess early pregnancy. COMPARISON:  None. FINDINGS: Intrauterine gestational sac: Single Yolk sac:  Yes Embryo:  Yes Cardiac Activity: Yet Heart Rate: 108  bpm MSD:   mm    w     d CRL:  4  mm   6 w   1 d                  [place] EDC: 04/12/2016 Maternal uterus/adnexae: Normal IMPRESSION: Single living intrauterine gestation measuring 6 weeks 1 day by crown-rump length. Electronically Signed   By: Andreas Newport M.D.   On: 08/19/2015 22:09   Results for orders placed or performed during the hospital encounter of 08/19/15 (from the past 24  hour(s))  Urinalysis, Routine w reflex microscopic (not at [facility])     Status: Abnormal   Collection Time: 08/19/15  8:40 PM  Result Value Ref Range   Color, Urine YELLOW YELLOW   APPearance CLEAR CLEAR   Specific Gravity, Urine 1.010 1.005 - 1.030   pH 5.5 5.0 - 8.0   Glucose, UA NEGATIVE NEGATIVE mg/dL   Hgb urine dipstick TRACE (A) NEGATIVE   Bilirubin Urine NEGATIVE NEGATIVE   Ketones, ur NEGATIVE NEGATIVE mg/dL   Protein, ur NEGATIVE NEGATIVE mg/dL  Nitrite NEGATIVE NEGATIVE   Leukocytes, UA NEGATIVE NEGATIVE  Urine microscopic-add on     Status: Abnormal   Collection Time: 08/19/15  8:40 PM  Result Value Ref Range   Squamous Epithelial / LPF 0-5 (A) NONE SEEN   WBC, UA NONE SEEN 0 - 5 WBC/hpf   RBC / HPF 0-5 0 - 5 RBC/hpf   Bacteria, UA RARE (A) NONE SEEN  Pregnancy, urine POC     Status: Abnormal   Collection Time: 08/19/15  8:48 PM  Result Value Ref Range   Preg Test, Ur POSITIVE (A) NEGATIVE  CBC with Differential/Platelet     Status: Abnormal   Collection Time: 08/19/15  9:20 PM  Result Value Ref Range   WBC 9.3 4.0 - 10.5 K/uL   RBC 3.67 (L) 3.87 - 5.11 MIL/uL   Hemoglobin 11.4 (L) 12.0 - 15.0 g/dL   HCT 32.9 (L) 36.0 - 46.0 %   MCV 89.6 78.0 - 100.0 fL   MCH 31.1 26.0 - 34.0 pg   MCHC 34.7 30.0 - 36.0 g/dL   RDW 13.1 11.5 - 15.5 %   Platelets 212 150 - 400 K/uL   Neutrophils Relative % 54 %   Neutro Abs 5.0 1.7 - 7.7 K/uL   Lymphocytes Relative 34 %   Lymphs Abs 3.2 0.7 - 4.0 K/uL   Monocytes Relative 6 %   Monocytes Absolute 0.5 0.1 - 1.0 K/uL   Eosinophils Relative 6 %   Eosinophils Absolute 0.5 0.0 - 0.7 K/uL   Basophils Relative 0 %   Basophils Absolute 0.0 0.0 - 0.1 K/uL  hCG, quantitative, pregnancy     Status: Abnormal   Collection Time: 08/19/15  9:20 PM  Result Value Ref Range   hCG, Beta Chain, Quant, S 4934 (H) <5 mIU/mL   Pt known to be B+ blood type therefore no Rh concerns.   Hemodynamically stable IUP seen on u/s  No sign of  infection Pt stable for discharge  Assessment and Plan  A:  1. Abdominal pain affecting pregnancy   2. Abdominal pain affecting pregnancy    P: Discharge to home PNV qd - rx given [facility] asap - list of providers and pregnancy verification letter given May use OTC Tylenol for discomfort Patient may return to MAU as needed or if her condition were to change or worsen    Paticia Stack 08/19/2015, 9:10 PM

## 2015-08-19 NOTE — Discharge Instructions (Signed)
Prenatal Care °WHAT IS PRENATAL CARE?  °Prenatal care is the process of caring for a pregnant woman before she gives birth. Prenatal care makes sure that she and her baby remain as healthy as possible throughout pregnancy. Prenatal care may be provided by a midwife, family practice health care provider, or a childbirth and pregnancy specialist (obstetrician). Prenatal care may include physical examinations, testing, treatments, and education on nutrition, lifestyle, and social support services. °WHY IS PRENATAL CARE SO IMPORTANT?  °Early and consistent prenatal care increases the chance that you and your baby will remain healthy throughout your pregnancy. This type of care also decreases a baby's risk of being born too early (prematurely), or being born smaller than expected (small for gestational age). Any underlying medical conditions you may have that could pose a risk during your pregnancy are discussed during prenatal care visits. You will also be monitored regularly for any new conditions that may arise during your pregnancy so they can be treated quickly and effectively. °WHAT HAPPENS DURING PRENATAL CARE VISITS? °Prenatal care visits may include the following: °Discussion °Tell your health care provider about any new signs or symptoms you have experienced since your last visit. These might include: °· Nausea or vomiting. °· Increased or decreased level of energy. °· Difficulty sleeping. °· Back or leg pain. °· Weight changes. °· Frequent urination. °· Shortness of breath with physical activity. °· Changes in your skin, such as the development of a rash or itchiness. °· Vaginal discharge or bleeding. °· Feelings of excitement or nervousness. °· Changes in your baby's movements. °You may want to write down any questions or topics you want to discuss with your health care provider and bring them with you to your appointment. °Examination °During your first prenatal care visit, you will likely have a complete  physical exam. Your health care provider will often examine your vagina, cervix, and the position of your uterus, as well as check your heart, lungs, and other body systems. As your pregnancy progresses, your health care provider will measure the size of your uterus and your baby's position inside your uterus. He or she may also examine you for early signs of labor. Your prenatal visits may also include checking your blood pressure and, after about 10-12 weeks of pregnancy, listening to your baby's heartbeat. °Testing °Regular testing often includes: °· Urinalysis. This checks your urine for glucose, protein, or signs of infection. °· Blood count. This checks the levels of white and red blood cells in your body. °· Tests for sexually transmitted infections (STIs). Testing for STIs at the beginning of pregnancy is routinely done and is required in many states. °· Antibody testing. You will be checked to see if you are immune to certain illnesses, such as rubella, that can affect a developing fetus. °· Glucose screen. Around 24-28 weeks of pregnancy, your blood glucose level will be checked for signs of gestational diabetes. Follow-up tests may be recommended. °· Group B strep. This is a bacteria that is commonly found inside a woman's vagina. This test will inform your health care provider if you need an antibiotic to reduce the amount of this bacteria in your body prior to labor and childbirth. °· Ultrasound. Many pregnant women undergo an ultrasound screening around 18-20 weeks of pregnancy to evaluate the health of the fetus and check for any developmental abnormalities. °· HIV (human immunodeficiency virus) testing. Early in your pregnancy, you will be screened for HIV. If you are at high risk for HIV, this test   may be repeated during your third trimester of pregnancy. You may be offered other testing based on your age, personal or family medical history, or other factors.  HOW OFTEN SHOULD I PLAN TO SEE MY  HEALTH CARE PROVIDER FOR PRENATAL CARE? Your prenatal care check-up schedule depends on any medical conditions you have before, or develop during, your pregnancy. If you do not have any underlying medical conditions, you will likely be seen for checkups:  Monthly, during the first 6 months of pregnancy.  Twice a month during months 7 and 8 of pregnancy.  Weekly starting in the 9th month of pregnancy and until delivery. If you develop signs of early labor or other concerning signs or symptoms, you may need to see your health care provider more often. Ask your health care provider what prenatal care schedule is best for you. WHAT CAN I DO TO KEEP MYSELF AND MY BABY AS HEALTHY AS POSSIBLE DURING MY PREGNANCY?  Take a prenatal vitamin containing 400 micrograms (0.4 mg) of folic acid every day. Your health care provider may also ask you to take additional vitamins such as iodine, vitamin D, iron, copper, and zinc.  Take 1500-2000 mg of calcium daily starting at your 20th week of pregnancy until you deliver your baby.  Make sure you are up to date on your vaccinations. Unless directed otherwise by your health care provider:  You should receive a tetanus, diphtheria, and pertussis (Tdap) vaccination between the 27th and 36th week of your pregnancy, regardless of when your last Tdap immunization occurred. This helps protect your baby from whooping cough (pertussis) after he or she is born.  You should receive an annual inactivated influenza vaccine (IIV) to help protect you and your baby from influenza. This can be done at any point during your pregnancy.  Eat a well-rounded diet that includes:  Fresh fruits and vegetables.  Lean proteins.  Calcium-rich foods such as milk, yogurt, hard cheeses, and dark, leafy greens.  Whole grain breads.  Do noteat seafood high in mercury, including:  Swordfish.  Tilefish.  Shark.  King mackerel.  More than 6 oz tuna per week.  Do not eat:  Raw  or undercooked meats or eggs.  Unpasteurized foods, such as soft cheeses (brie, blue, or feta), juices, and milks.  Lunch meats.  Hot dogs that have not been heated until they are steaming.  Drink enough water to keep your urine clear or pale yellow. For many women, this may be 10 or more 8 oz glasses of water each day. Keeping yourself hydrated helps deliver nutrients to your baby and may prevent the start of pre-term uterine contractions.  Do not use any tobacco products including cigarettes, chewing tobacco, or electronic cigarettes. If you need help quitting, ask your health care provider.  Do not drink beverages containing alcohol. No safe level of alcohol consumption during pregnancy has been determined.  Do not use any illegal drugs. These can harm your developing baby or cause a miscarriage.  Ask your health care provider or pharmacist before taking any prescription or over-the-counter medicines, herbs, or supplements.  Limit your caffeine intake to no more than 200 mg per day.  Exercise. Unless told otherwise by your health care provider, try to get 30 minutes of moderate exercise most days of the week. Do not  do high-impact activities, contact sports, or activities with a high risk of falling, such as horseback riding or downhill skiing.  Get plenty of rest.  Avoid anything that raises your  body temperature, such as hot tubs and saunas. °· If you own a cat, do not empty its litter box. Bacteria contained in cat feces can cause an infection called toxoplasmosis. This can result in serious harm to the fetus. °· Stay away from chemicals such as insecticides, lead, mercury, and cleaning or paint products that contain solvents. °· Do not have any X-rays taken unless medically necessary. °· Take a childbirth and breastfeeding preparation class. Ask your health care provider if you need a referral or recommendation. °  °This information is not intended to replace advice given to you by  your health care provider. Make sure you discuss any questions you have with your health care provider. °  °Document Released: 09/05/2003 Document Revised: 09/23/2014 Document Reviewed: 11/17/2013 °Elsevier Interactive Patient Education ©2016 [employer] ° °

## 2015-08-19 NOTE — MAU Note (Signed)
Pt reports LMP 07/07/2015 and she has had a positive home preg test. Pt reports she has pain off/on in her lower abd. Denies nausea, vomiting, diarrhea, of fever.

## 2015-08-30 ENCOUNTER — Telehealth: Payer: Self-pay | Admitting: *Deleted

## 2015-08-30 ENCOUNTER — Encounter (HOSPITAL_COMMUNITY): Payer: Self-pay | Admitting: *Deleted

## 2015-08-30 ENCOUNTER — Inpatient Hospital Stay (HOSPITAL_COMMUNITY)
Admission: AD | Admit: 2015-08-30 | Discharge: 2015-08-30 | Disposition: A | Discharge status: Home / Self Care | Payer: Medicaid Other | Source: Ambulatory Visit | Attending: Obstetrics & Gynecology | Admitting: Obstetrics & Gynecology

## 2015-08-30 DIAGNOSIS — K219 Gastro-esophageal reflux disease without esophagitis: Secondary | ICD-10-CM | POA: Diagnosis not present

## 2015-08-30 DIAGNOSIS — R51 Headache: Secondary | ICD-10-CM

## 2015-08-30 DIAGNOSIS — Z3A01 Less than 8 weeks gestation of pregnancy: Secondary | ICD-10-CM | POA: Diagnosis not present

## 2015-08-30 DIAGNOSIS — R42 Dizziness and giddiness: Secondary | ICD-10-CM | POA: Diagnosis not present

## 2015-08-30 DIAGNOSIS — R519 Headache, unspecified: Secondary | ICD-10-CM

## 2015-08-30 DIAGNOSIS — O26891 Other specified pregnancy related conditions, first trimester: Secondary | ICD-10-CM | POA: Insufficient documentation

## 2015-08-30 LAB — URINALYSIS, ROUTINE W REFLEX MICROSCOPIC
Bilirubin Urine: NEGATIVE
GLUCOSE, UA: NEGATIVE mg/dL
Hgb urine dipstick: NEGATIVE
KETONES UR: NEGATIVE mg/dL
LEUKOCYTES UA: NEGATIVE
Nitrite: NEGATIVE
Protein, ur: NEGATIVE mg/dL
Specific Gravity, Urine: 1.015 (ref 1.005–1.030)
pH: 7 (ref 5.0–8.0)

## 2015-08-30 LAB — CBC
HCT: 33.3 % — ABNORMAL LOW (ref 36.0–46.0)
Hemoglobin: 11.6 g/dL — ABNORMAL LOW (ref 12.0–15.0)
MCH: 31.4 pg (ref 26.0–34.0)
MCHC: 34.8 g/dL (ref 30.0–36.0)
MCV: 90 fL (ref 78.0–100.0)
Platelets: 209 10*3/uL (ref 150–400)
RBC: 3.7 MIL/uL — ABNORMAL LOW (ref 3.87–5.11)
RDW: 13.2 % (ref 11.5–15.5)
WBC: 5.9 10*3/uL (ref 4.0–10.5)

## 2015-08-30 LAB — COMPREHENSIVE METABOLIC PANEL
ALBUMIN: 3.6 g/dL (ref 3.5–5.0)
ALT: 30 U/L (ref 14–54)
ANION GAP: 6 (ref 5–15)
AST: 28 U/L (ref 15–41)
Alkaline Phosphatase: 46 U/L (ref 38–126)
BUN: 8 mg/dL (ref 6–20)
CALCIUM: 8.9 mg/dL (ref 8.9–10.3)
CO2: 24 mmol/L (ref 22–32)
Chloride: 104 mmol/L (ref 101–111)
Creatinine, Ser: 0.5 mg/dL (ref 0.44–1.00)
GFR calc Af Amer: >60 mL/min (ref >60)
GFR calc non Af Amer: >60 mL/min (ref >60)
GLUCOSE: 99 mg/dL (ref 65–99)
Potassium: 3.6 mmol/L (ref 3.5–5.1)
SODIUM: 134 mmol/L — AB (ref 135–145)
Total Bilirubin: 0.6 mg/dL (ref 0.3–1.2)
Total Protein: 7.4 g/dL (ref 6.5–8.1)

## 2015-08-30 MED ORDER — MECLIZINE HCL 25 MG PO TABS
25.0000 mg | ORAL_TABLET | Freq: Once | ORAL | Status: AC
  Administered 2015-08-30: 25 mg via ORAL
  Filled 2015-08-30: qty 1

## 2015-08-30 MED ORDER — ACETAMINOPHEN 325 MG PO TABS
650.0000 mg | ORAL_TABLET | Freq: Once | ORAL | Status: AC
  Administered 2015-08-30: 650 mg via ORAL
  Filled 2015-08-30: qty 2

## 2015-08-30 MED ORDER — MECLIZINE HCL 25 MG PO TABS: 25.0000 mg | ORAL_TABLET | Freq: Three times a day (TID) | ORAL | Status: DC | PRN

## 2015-08-30 NOTE — Discharge Instructions (Signed)

## 2015-08-30 NOTE — MAU Provider Note (Signed)
History     CSN: [id number]  Arrival date and time: 08/30/15 [id number]   First Provider Initiated Contact with Patient 08/30/15 1023         Chief Complaint  Patient presents with  . Dizziness   HPI Sylvia Best is a 34 y.o. [id number] at [redacted]w[redacted]d who presents for dizziness. Symptoms began at 6 am this morning. When she got up this morning felt like room was spinning. Position changes and walking make symptoms worse. Lying down improved symptoms. Vomited once this morning. Denies nausea or diarrhea.  Denies abdominal pain or vaginal bleeding.  Denies ear pain, tinnitus, chest pain, SOB, syncope, palpitations, fever, or sinus pain.  Reports occipital headache x 1 hour. Dull pain. Rates 10/10. No treatment.     OB History    Gravida Para Term Preterm AB TAB SAB Ectopic Multiple Living   4 1 1  0 2 0 2 0 1 2      Past Medical History  Diagnosis Date  . GERD (gastroesophageal reflux disease)     no meds  . Diabetes mellitus     gestational-takes glyburide-fbs this am -68 instructed to hold glyburide day of c section    Past Surgical History  Procedure Laterality Date  . Tonsillectomy      age 24  . Cesarean section  04/26/2011    Procedure: CESAREAN SECTION;  Surgeon: Donnamae Jude, MD;  Location: [facility];  Service: Gynecology;  Laterality: N/A;    Family History  Problem Relation Age of Onset  . Hypertension Mother   . Heart disease Father   . Diabetes Father   . Diabetes Paternal Grandfather     Social History  Substance Use Topics  . Smoking status: Never Smoker   . Smokeless tobacco: Never Used  . Alcohol Use: No    Allergies: No Known Allergies  Prescriptions prior to admission  Medication Sig Dispense Refill Last Dose  . acetaminophen (TYLENOL) 500 MG tablet Take 1,000 mg by mouth every 6 (six) hours as needed for mild pain or headache.   PRN at PRN  . albuterol (PROVENTIL HFA;VENTOLIN HFA) 108 (90 BASE) MCG/ACT inhaler Inhale 2 puffs into the lungs every 6 (six)  hours as needed for wheezing or shortness of breath.   PRN at PRN  . Multiple Vitamin (MULTIVITAMIN WITH MINERALS) TABS tablet Take 1 tablet by mouth daily.   08/19/2015 at Unknown time  . Prenatal Vit-Fe Fumarate-FA (PRENATAL VITAMINS PLUS) 27-1 MG TABS Take 1 tablet by mouth daily. 30 tablet 11     Review of Systems  Constitutional: Negative.   HENT: Negative for congestion, ear pain, hearing loss and tinnitus.   Eyes: Negative.   Respiratory: Negative.   Gastrointestinal: Negative for diarrhea and constipation.  Genitourinary: Negative.   Neurological: Positive for dizziness and headaches. Negative for tingling, sensory change, speech change, seizures and loss of consciousness.   Physical Exam   Blood pressure 109/68, pulse 76, temperature 98.2 F (36.8 C), temperature source Oral, resp. rate 18, last menstrual period 07/07/2015.   Orthostatic VS for the past 24 hrs:  BP- Lying Pulse- Lying BP- Sitting Pulse- Sitting BP- Standing at 0 minutes  08/30/15 1001 109/74 mmHg 79 111/76 mmHg 72 -    Physical Exam  Nursing note and vitals reviewed. Constitutional: She is oriented to person, place, and time. She appears well-developed and well-nourished. No distress.  HENT:  Head: Normocephalic and atraumatic.  Right Ear: Tympanic membrane and ear canal normal.  Left Ear:  Tympanic membrane and ear canal normal.  Eyes: Conjunctivae are normal. Right eye exhibits no discharge. Left eye exhibits no discharge. No scleral icterus.  Neck: Normal range of motion.  Cardiovascular: Normal rate, regular rhythm and normal heart sounds.   No murmur heard. Respiratory: Effort normal and breath sounds normal. No respiratory distress. She has no wheezes.  GI: Soft. Bowel sounds are normal. She exhibits no distension. There is no tenderness.  Neurological: She is alert and oriented to person, place, and time. She has normal strength and normal reflexes. No cranial nerve deficit.  No nystagmus  Skin:  Skin is warm and dry. She is not diaphoretic.  Psychiatric: She has a normal mood and affect. Her behavior is normal. Judgment and thought content normal.    MAU Course  Procedures Results for orders placed or performed during the hospital encounter of 08/30/15 (from the past 24 hour(s))  CBC     Status: Abnormal   Collection Time: 08/30/15 10:10 AM  Result Value Ref Range   WBC 5.9 4.0 - 10.5 K/uL   RBC 3.70 (L) 3.87 - 5.11 MIL/uL   Hemoglobin 11.6 (L) 12.0 - 15.0 g/dL   HCT 33.3 (L) 36.0 - 46.0 %   MCV 90.0 78.0 - 100.0 fL   MCH 31.4 26.0 - 34.0 pg   MCHC 34.8 30.0 - 36.0 g/dL   RDW 13.2 11.5 - 15.5 %   Platelets 209 150 - 400 K/uL  Comprehensive metabolic panel     Status: Abnormal   Collection Time: 08/30/15 10:10 AM  Result Value Ref Range   Sodium 134 (L) 135 - 145 mmol/L   Potassium 3.6 3.5 - 5.1 mmol/L   Chloride 104 101 - 111 mmol/L   CO2 24 22 - 32 mmol/L   Glucose, Bld 99 65 - 99 mg/dL   BUN 8 6 - 20 mg/dL   Creatinine, Ser 0.50 0.44 - 1.00 mg/dL   Calcium 8.9 8.9 - 10.3 mg/dL   Total Protein 7.4 6.5 - 8.1 g/dL   Albumin 3.6 3.5 - 5.0 g/dL   AST 28 15 - 41 U/L   ALT 30 14 - 54 U/L   Alkaline Phosphatase 46 38 - 126 U/L   Total Bilirubin 0.6 0.3 - 1.2 mg/dL   GFR calc non Af Amer >60 >60 mL/min   GFR calc Af Amer >60 >60 mL/min   Anion gap 6 5 - 15  Urinalysis, Routine w reflex microscopic (not at [facility])     Status: None   Collection Time: 08/30/15 10:45 AM  Result Value Ref Range   Color, Urine YELLOW YELLOW   APPearance CLEAR CLEAR   Specific Gravity, Urine 1.015 1.005 - 1.030   pH 7.0 5.0 - 8.0   Glucose, UA NEGATIVE NEGATIVE mg/dL   Hgb urine dipstick NEGATIVE NEGATIVE   Bilirubin Urine NEGATIVE NEGATIVE   Ketones, ur NEGATIVE NEGATIVE mg/dL   Protein, ur NEGATIVE NEGATIVE mg/dL   Nitrite NEGATIVE NEGATIVE   Leukocytes, UA NEGATIVE NEGATIVE    MDM Tylenol for headache  Meclizine for dizziness Slow changes in position - patient reports some  improvement in symptoms Headache resolved with 1 dose of tylenol  Assessment and Plan  A:  1. Dizziness   2. Headache in pregnancy, first trimester     P: Discharge home Rx meclizine Take tylenol prn headache Start prenatal care Slow position changes No driving while symptomatic Discussed reasons to return to [place], NP   08/30/2015, 9:56 AM

## 2015-08-30 NOTE — MAU Note (Signed)
Woke up sick this morning, vomited once, no diarrhea, feeling very dizzy, unable to go to work.  Denies pain or bleeding.

## 2015-08-30 NOTE — Telephone Encounter (Signed)
Pt left message on nurse voicemail and only stated her name and telephone number. She stated # [phone] however call was received from # [phone].

## 2015-08-31 ENCOUNTER — Encounter (HOSPITAL_COMMUNITY): Payer: Self-pay | Admitting: Emergency Medicine

## 2015-08-31 ENCOUNTER — Emergency Department (HOSPITAL_COMMUNITY)
Admission: EM | Admit: 2015-08-31 | Discharge: 2015-08-31 | Disposition: A | Discharge status: Home / Self Care | Payer: Medicaid Other | Attending: Emergency Medicine | Admitting: Emergency Medicine

## 2015-08-31 DIAGNOSIS — O9989 Other specified diseases and conditions complicating pregnancy, childbirth and the puerperium: Secondary | ICD-10-CM | POA: Diagnosis present

## 2015-08-31 DIAGNOSIS — Z3A01 Less than 8 weeks gestation of pregnancy: Secondary | ICD-10-CM | POA: Diagnosis not present

## 2015-08-31 DIAGNOSIS — R42 Dizziness and giddiness: Secondary | ICD-10-CM | POA: Insufficient documentation

## 2015-08-31 DIAGNOSIS — O24311 Unspecified pre-existing diabetes mellitus in pregnancy, first trimester: Secondary | ICD-10-CM | POA: Insufficient documentation

## 2015-08-31 DIAGNOSIS — Z8719 Personal history of other diseases of the digestive system: Secondary | ICD-10-CM | POA: Diagnosis not present

## 2015-08-31 DIAGNOSIS — Z79899 Other long term (current) drug therapy: Secondary | ICD-10-CM | POA: Diagnosis not present

## 2015-08-31 MED ORDER — DIPHENHYDRAMINE HCL 50 MG/ML IJ SOLN
25.0000 mg | Freq: Once | INTRAMUSCULAR | Status: AC
  Administered 2015-08-31: 25 mg via INTRAVENOUS
  Filled 2015-08-31: qty 1

## 2015-08-31 MED ORDER — METOCLOPRAMIDE HCL 5 MG/ML IJ SOLN
10.0000 mg | Freq: Once | INTRAMUSCULAR | Status: AC
  Administered 2015-08-31: 10 mg via INTRAVENOUS
  Filled 2015-08-31: qty 2

## 2015-08-31 NOTE — Discharge Instructions (Signed)

## 2015-08-31 NOTE — ED Provider Notes (Signed)
CSN: [id number]     Arrival date & time 08/31/15  1122 History   First MD Initiated Contact with Patient 08/31/15 1138     Chief Complaint  Patient presents with  . Dizziness     (Consider location/radiation/quality/duration/timing/severity/associated sxs/prior Treatment) HPI Comments: 34yo F who is [redacted]wk pregnant p/w dizziness. Pt has had dizziness which she describes as room-spinning sensation for which she presented to [facility] hospital yesterday and was discharged w/ meclizine. She took a dose last night and one this morning around 8:30am but has continued to have dizziness. 1 episode of vomiting yesterday, none today. She has had good fluid intake. No headaches, visual changes, neck pain, fevers, abd pain, or vaginal bleeding. No extremity weakness. She endorses intermittent lightheadedness and head "heaviness" when she wakes up in the morning. She denies any pain.  Patient is a 34 y.o. female presenting with dizziness. The history is provided by the patient.  Dizziness   Past Medical History  Diagnosis Date  . GERD (gastroesophageal reflux disease)     no meds  . Diabetes mellitus     gestational-takes glyburide-fbs this am -69 instructed to hold glyburide day of c section   Past Surgical History  Procedure Laterality Date  . Tonsillectomy      age 33  . Cesarean section  04/26/2011    Procedure: CESAREAN SECTION;  Surgeon: Donnamae Jude, MD;  Location: [facility];  Service: Gynecology;  Laterality: N/A;   Family History  Problem Relation Age of Onset  . Hypertension Mother   . Heart disease Father   . Diabetes Father   . Diabetes Paternal Grandfather    Social History  Substance Use Topics  . Smoking status: Never Smoker   . Smokeless tobacco: Never Used  . Alcohol Use: No   OB History    Gravida Para Term Preterm AB TAB SAB Ectopic Multiple Living   4 1 1  0 2 0 2 0 1 2     Review of Systems  Neurological: Positive for dizziness.   10 Systems reviewed and are  negative for acute change except as noted in the HPI.    Allergies  Review of patient's allergies indicates no known allergies.  Home Medications   Prior to Admission medications   Medication Sig Start Date End Date Taking? Authorizing Provider  acetaminophen (TYLENOL) 500 MG tablet Take 500 mg by mouth every 6 (six) hours as needed for mild pain.   Yes Historical Provider, MD  meclizine (ANTIVERT) 25 MG tablet Take 1 tablet (25 mg total) by mouth 3 (three) times daily as needed for dizziness. 08/30/15  Yes Jorje Guild, NP  Prenatal Vit-Fe Fumarate-FA (PRENATAL VITAMINS PLUS) 27-1 MG TABS Take 1 tablet by mouth daily. 08/19/15  Yes Collene Leyden Teague Clark, PA-C   BP 94/59 mmHg  Pulse 77  Temp(Src) 99.1 F (37.3 C) (Oral)  Resp 18  SpO2 100%  LMP 07/07/2015 Physical Exam  Constitutional: She is oriented to person, place, and time. She appears well-developed and well-nourished. No distress.  Awake, alert  HENT:  Head: Normocephalic and atraumatic.  Eyes: Conjunctivae and EOM are normal. Pupils are equal, round, and reactive to light.  Neck: Normal range of motion. Neck supple.  Cardiovascular: Normal rate, regular rhythm and normal heart sounds.   No murmur heard. Pulmonary/Chest: Effort normal and breath sounds normal. No respiratory distress.  Abdominal: Soft. Bowel sounds are normal. She exhibits no distension.  Musculoskeletal: She exhibits no edema.  5/5 strength and normal  sensation x all 4 ext  Neurological: She is alert and oriented to person, place, and time. She has normal reflexes. No cranial nerve deficit. She exhibits normal muscle tone.  Fluent speech, normal finger-to-nose testing, negative pronator drift  Skin: Skin is warm and dry.  Psychiatric: She has a normal mood and affect. Judgment and thought content normal.  Nursing note and vitals reviewed.   ED Course  Procedures (including critical care time) Labs Review Labs Reviewed - No data to  display  Medications  diphenhydrAMINE (BENADRYL) injection 25 mg (25 mg Intravenous Given 08/31/15 1345)  metoCLOPramide (REGLAN) injection 10 mg (10 mg Intravenous Given 08/31/15 1345)     MDM   Final diagnoses:  Vertigo   PT currently [redacted] wk pregnant w/ OBGYN evaluation yesterday for dizziness who p/w ongoing sx despite taking meclizine this morning. Pt uncomfortable but otherwise well appearing on exam w/ normal VS. Normal neurologic exam. She denies any neurologic sx except vertigo. She denied any vertigo in the ED. Gave pt benadryl and reglan. On reexamination after observation, pt stated she felt better and denied any dizziness in ED. Given normal neuro exam and no red flag sx, I feel she is safe for d/c w/ OBGYN f/u. Discussed supportive care including Epley maneuver. Pt voiced understanding and was discharged in satisfactory condition.    Sharlett Iles, MD 08/31/15 [phone]

## 2015-08-31 NOTE — ED Notes (Signed)
Pt c/o dizziness worse with position change and feels like the room is spinning; pt sts some nausea and is currently [redacted] weeks pregnant; LMP 07/07/15; pt seen at [facility] yesterday for same

## 2015-08-31 NOTE — ED Notes (Signed)
Pt reports dizziness that is worse with movement. Pt states that [facility] and was prescribed meclizine with no relief after 4 tablets. Pt reports 1 episode of vomiting yesterday. Reports drinking plenty of fluids. Denies hx if dizziness or any complications with previous pregnancies.

## 2015-08-31 NOTE — Telephone Encounter (Signed)
Called patient with [facility] for interpreter. Patient states she was just calling to get a sooner appt because she hasn't been feeling well. Told patient to take the medication they recommended yesterday and that her current appt is when we need to see her to start care. Patient verbalized understanding & had no other questions

## 2015-09-23 ENCOUNTER — Encounter (HOSPITAL_COMMUNITY): Payer: Self-pay | Admitting: Advanced Practice Midwife

## 2015-09-23 ENCOUNTER — Inpatient Hospital Stay (HOSPITAL_COMMUNITY)
Admission: AD | Admit: 2015-09-23 | Discharge: 2015-09-23 | Disposition: A | Discharge status: Home / Self Care | Payer: Medicaid Other | Source: Ambulatory Visit | Attending: Family Medicine | Admitting: Family Medicine

## 2015-09-23 DIAGNOSIS — Z3A11 11 weeks gestation of pregnancy: Secondary | ICD-10-CM | POA: Diagnosis not present

## 2015-09-23 DIAGNOSIS — K219 Gastro-esophageal reflux disease without esophagitis: Secondary | ICD-10-CM | POA: Insufficient documentation

## 2015-09-23 DIAGNOSIS — O209 Hemorrhage in early pregnancy, unspecified: Secondary | ICD-10-CM | POA: Insufficient documentation

## 2015-09-23 DIAGNOSIS — O24419 Gestational diabetes mellitus in pregnancy, unspecified control: Secondary | ICD-10-CM | POA: Insufficient documentation

## 2015-09-23 LAB — URINALYSIS, ROUTINE W REFLEX MICROSCOPIC
BILIRUBIN URINE: NEGATIVE
GLUCOSE, UA: NEGATIVE mg/dL
Ketones, ur: NEGATIVE mg/dL
LEUKOCYTES UA: NEGATIVE
NITRITE: NEGATIVE
PH: 6 (ref 5.0–8.0)
Protein, ur: NEGATIVE mg/dL
SPECIFIC GRAVITY, URINE: 1.02 (ref 1.005–1.030)

## 2015-09-23 LAB — URINE MICROSCOPIC-ADD ON: WBC UA: NONE SEEN WBC/hpf (ref 0–5)

## 2015-09-23 LAB — CBC
HCT: 32.5 % — ABNORMAL LOW (ref 36.0–46.0)
Hemoglobin: 11.4 g/dL — ABNORMAL LOW (ref 12.0–15.0)
MCH: 31.8 pg (ref 26.0–34.0)
MCHC: 35.1 g/dL (ref 30.0–36.0)
MCV: 90.8 fL (ref 78.0–100.0)
Platelets: 204 K/uL (ref 150–400)
RBC: 3.58 MIL/uL — ABNORMAL LOW (ref 3.87–5.11)
RDW: 13 % (ref 11.5–15.5)
WBC: 8.5 K/uL (ref 4.0–10.5)

## 2015-09-23 LAB — WET PREP, GENITAL
Clue Cells Wet Prep HPF POC: NONE SEEN
Sperm: NONE SEEN
Trich, Wet Prep: NONE SEEN
Yeast Wet Prep HPF POC: NONE SEEN

## 2015-09-23 NOTE — Discharge Instructions (Signed)
Vaginal Bleeding During Pregnancy, First Trimester °A small amount of bleeding (spotting) from the vagina is relatively common in early pregnancy. It usually stops on its own. Various things may cause bleeding or spotting in early pregnancy. Some bleeding may be related to the pregnancy, and some may not. In most cases, the bleeding is normal and is not a problem. However, bleeding can also be a sign of something serious. Be sure to tell your health care provider about any vaginal bleeding right away. °Some possible causes of vaginal bleeding during the first trimester include: °· Infection or inflammation of the cervix. °· Growths (polyps) on the cervix. °· Miscarriage or threatened miscarriage. °· Pregnancy tissue has developed outside of the uterus and in a fallopian tube (tubal pregnancy). °· Tiny cysts have developed in the uterus instead of pregnancy tissue (molar pregnancy). °HOME CARE INSTRUCTIONS  °Watch your condition for any changes. The following actions may help to lessen any discomfort you are feeling: °· Follow your health care provider's instructions for limiting your activity. If your health care provider orders bed rest, you may need to stay in bed and only get up to use the bathroom. However, your health care provider may allow you to continue light activity. °· If needed, make plans for someone to help with your regular activities and responsibilities while you are on bed rest. °· Keep track of the number of pads you use each day, how often you change pads, and how soaked (saturated) they are. Write this down. °· Do not use tampons. Do not douche. °· Do not have sexual intercourse or orgasms until approved by your health care provider. °· If you pass any tissue from your vagina, save the tissue so you can show it to your health care provider. °· Only take over-the-counter or prescription medicines as directed by your health care provider. °· Do not take aspirin because it can make you  bleed. °· Keep all follow-up appointments as directed by your health care provider. °SEEK MEDICAL CARE IF: °· You have any vaginal bleeding during any part of your pregnancy. °· You have cramps or labor pains. °· You have a fever, not controlled by medicine. °SEEK IMMEDIATE MEDICAL CARE IF:  °· You have severe cramps in your back or belly (abdomen). °· You pass large clots or tissue from your vagina. °· Your bleeding increases. °· You feel light-headed or weak, or you have fainting episodes. °· You have chills. °· You are leaking fluid or have a gush of fluid from your vagina. °· You pass out while having a bowel movement. °MAKE SURE YOU: °· Understand these instructions. °· Will watch your condition. °· Will get help right away if you are not doing well or get worse. °  °This information is not intended to replace advice given to you by your health care provider. Make sure you discuss any questions you have with your health care provider. °  °Document Released: 06/12/2005 Document Revised: 09/07/2013 Document Reviewed: 05/10/2013 °Elsevier Interactive Patient Education ©2016 Else[employer] Inc[employer] ° °Pelvic Rest °Pelvic rest is sometimes recommended for women when:  °· The placenta is partially or completely covering the opening of the cervix (placenta previa). °· There is bleeding between the uterine wall and the amniotic sac in the first trimester (subchorionic hemorrhage). °· The cervix begins to open without labor starting (incompetent cervix, cervical insufficiency). °· The labor is too early (preterm labor). °HOME CARE INSTRUCTIONS °· Do not have sexual intercourse, stimulation, or an orgasm. °· Do   not use tampons, douche, or put anything in the vagina. °· Do not lift anything over 10 pounds (4.5 kg). °· Avoid strenuous activity or straining your pelvic muscles. °SEEK MEDICAL CARE IF:  °· You have any vaginal bleeding during pregnancy. Treat this as a potential emergency. °· You have cramping pain felt low in the  stomach (stronger than menstrual cramps). °· You notice vaginal discharge (watery, mucus, or bloody). °· You have a low, dull backache. °· There are regular contractions or uterine tightening. °SEEK IMMEDIATE MEDICAL CARE IF: °You have vaginal bleeding and have placenta previa.  °  °This information is not intended to replace advice given to you by your health care provider. Make sure you discuss any questions you have with your health care provider. °  °Document Released: 12/28/2010 Document Revised: 11/25/2011 Document Reviewed: 03/06/2015 °Elsevier Interactive Patient Education ©2016 Elsevier Inc. ° °

## 2015-09-23 NOTE — MAU Provider Note (Signed)
Chief Complaint: Vaginal Bleeding  First Provider Initiated Contact with Patient 09/23/15 2256     SUBJECTIVE HPI: Sylvia Best is a 35 y.o. [id number] at [redacted]w[redacted]d who presents to [facility] reporting vaginal bleeding since 8 PM. Previous ultrasound 08/19/2015 showed 6 week 1 day live single intrauterine pregnancy. No recent intercourse.  Modifying factors: No recent intercourse Associated signs and symptoms: Denies abdominal pain, passage of clots, passage of tissue.   Past Medical History  Diagnosis Date  . GERD (gastroesophageal reflux disease)     no meds  .  history of gestational Diabetes mellitus    OB History  Gravida Para Term Preterm AB SAB TAB Ectopic Multiple Living  4 1 1  0 2 2 0 0 1 2    # Outcome Date GA Lbr Len/2nd Weight Sex Delivery Anes PTL Lv  4 Current           3A Term 04/26/11 [redacted]w[redacted]d  5 lb 5 oz (2.41 kg) F CS-LTranv Spinal  Y  3B Term 04/26/11 [redacted]w[redacted]d  5 lb 5 oz (2.41 kg) F CS-LTranv EPI  Y  2 SAB 02/2010 [redacted]w[redacted]d            Comments: no d&C needed, here at [facility]  1 SAB 07/2009 [redacted]w[redacted]d            Comments: had d&C, in [place]     Past Surgical History  Procedure Laterality Date  . Tonsillectomy      age 79  . Cesarean section  04/26/2011    Procedure: CESAREAN SECTION;  Surgeon: Donnamae Jude, MD;  Location: [facility];  Service: Gynecology;  Laterality: N/A;   Social History   Social History  . Marital Status: Married    Spouse Name: N/A  . Number of Children: N/A  . Years of Education: N/A   Occupational History  . Not on file.   Social History Main Topics  . Smoking status: Never Smoker   . Smokeless tobacco: Never Used  . Alcohol Use: No  . Drug Use: No  . Sexual Activity: Yes    Birth Control/ Protection: None   Other Topics Concern  . Not on file   Social History Narrative   No current facility-administered medications on file prior to encounter.   Current Outpatient Prescriptions on File Prior to Encounter  Medication Sig Dispense  Refill  . meclizine (ANTIVERT) 25 MG tablet Take 1 tablet (25 mg total) by mouth 3 (three) times daily as needed for dizziness. 30 tablet 0  . Prenatal Vit-Fe Fumarate-FA (PRENATAL VITAMINS PLUS) 27-1 MG TABS Take 1 tablet by mouth daily. 30 tablet 11  . acetaminophen (TYLENOL) 500 MG tablet Take 500 mg by mouth every 6 (six) hours as needed for mild pain.     No Known Allergies  I have reviewed the past Medical Hx, Surgical Hx, Social Hx, Allergies and Medications.   Review of Systems  Constitutional: Negative for fever and chills.  Gastrointestinal: Negative for abdominal pain.  Genitourinary: Positive for vaginal bleeding. Negative for vaginal discharge.    OBJECTIVE Patient Vitals for the past 24 hrs:  BP Temp Temp src Pulse Resp SpO2 Height Weight  09/23/15 2213 111/64 mmHg 98.1 F (36.7 C) Oral 81 16 100 % 5\' 2"  (1.575 m) 138 lb 9.6 oz (62.869 kg)   Constitutional: Well-developed, well-nourished female in no acute distress.  Cardiovascular: normal rate Respiratory: normal rate and effort.  GI: Abd soft, non-tender.  MS: Extremities nontender, no edema, normal ROM Neurologic:  Alert and oriented x 4.  GU: Neg CVAT.  SPECULUM EXAM: NEFG, scant brown blood noted, no odor, cervix clean  BIMANUAL: cervix long and closed; uterus 12-week size, no adnexal tenderness or masses. No CMT.  Fetal heart rate 187 by Doppler.  LAB RESULTS Results for orders placed or performed during the hospital encounter of 09/23/15 (from the past 24 hour(s))  Urinalysis, Routine w reflex microscopic (not at [facility])     Status: Abnormal   Collection Time: 09/23/15 10:44 PM  Result Value Ref Range   Color, Urine YELLOW YELLOW   APPearance CLEAR CLEAR   Specific Gravity, Urine 1.020 1.005 - 1.030   pH 6.0 5.0 - 8.0   Glucose, UA NEGATIVE NEGATIVE mg/dL   Hgb urine dipstick SMALL (A) NEGATIVE   Bilirubin Urine NEGATIVE NEGATIVE   Ketones, ur NEGATIVE NEGATIVE mg/dL   Protein, ur NEGATIVE NEGATIVE  mg/dL   Nitrite NEGATIVE NEGATIVE   Leukocytes, UA NEGATIVE NEGATIVE  Urine microscopic-add on     Status: Abnormal   Collection Time: 09/23/15 10:44 PM  Result Value Ref Range   Squamous Epithelial / LPF 0-5 (A) NONE SEEN   WBC, UA NONE SEEN 0 - 5 WBC/hpf   RBC / HPF 0-5 0 - 5 RBC/hpf   Bacteria, UA RARE (A) NONE SEEN  CBC     Status: Abnormal   Collection Time: 09/23/15 10:51 PM  Result Value Ref Range   WBC 8.5 4.0 - 10.5 K/uL   RBC 3.58 (L) 3.87 - 5.11 MIL/uL   Hemoglobin 11.4 (L) 12.0 - 15.0 g/dL   HCT 32.5 (L) 36.0 - 46.0 %   MCV 90.8 78.0 - 100.0 fL   MCH 31.8 26.0 - 34.0 pg   MCHC 35.1 30.0 - 36.0 g/dL   RDW 13.0 11.5 - 15.5 %   Platelets 204 150 - 400 K/uL  Wet prep, genital     Status: Abnormal   Collection Time: 09/23/15 11:01 PM  Result Value Ref Range   Yeast Wet Prep HPF POC NONE SEEN NONE SEEN   Trich, Wet Prep NONE SEEN NONE SEEN   Clue Cells Wet Prep HPF POC NONE SEEN NONE SEEN   WBC, Wet Prep HPF POC FEW (A) NONE SEEN   Sperm NONE SEEN     IMAGING No results found.  MAU COURSE CBC, wet prep and GC/chlamydia culture, UA, speculum exam  MDM Vaginal bleeding in early pregnancy with normal intrauterine pregnancy and hemodynamically stable.  ASSESSMENT 1. First trimester bleeding    PLAN Discharge home in stable condition. Bleeding precautions. Pelvic rest 1 week. GC/Chlamydia cultures pending.     Follow-up Information    Follow up with [facility] On 10/03/2015.   Specialty:  Obstetrics and Gynecology   Why:  Routine prenatal visit or sooner as needed if symptoms worsen   Contact information:   [place] [phone]      Follow up with [place].   Why:  As needed in emergencies   Contact information:   [address] [id number] [place] [phone]       Medication List    TAKE these medications         acetaminophen 500 MG tablet  Commonly known as:  TYLENOL  Take 500 mg by mouth every 6 (six) hours as needed for mild pain.     meclizine 25 MG tablet  Commonly known as:  ANTIVERT  Take 1 tablet (25 mg total) by mouth 3 (three) times daily as needed for dizziness.     PRENATAL VITAMINS PLUS 27-1 MG Tabs  Take 1 tablet by mouth daily.        [place], CNM 09/23/2015  11:28 PM  4

## 2015-09-23 NOTE — MAU Note (Signed)
Pt c/o vaginal bleeding that started around 8pm. States she is wearing pad but just changed it.0 Denies pain or passing large clots.

## 2015-09-24 LAB — HIV ANTIBODY (ROUTINE TESTING W REFLEX): HIV Screen 4th Generation wRfx: NONREACTIVE

## 2015-09-25 LAB — GC/CHLAMYDIA PROBE AMP (~~LOC~~) NOT AT ARMC
Chlamydia: NEGATIVE
NEISSERIA GONORRHEA: NEGATIVE

## 2015-09-27 ENCOUNTER — Inpatient Hospital Stay (HOSPITAL_COMMUNITY)
Admission: AD | Admit: 2015-09-27 | Discharge: 2015-09-27 | Disposition: A | Discharge status: Home / Self Care | Payer: Medicaid Other | Source: Ambulatory Visit | Attending: Family Medicine | Admitting: Family Medicine

## 2015-09-27 ENCOUNTER — Encounter (HOSPITAL_COMMUNITY): Payer: Self-pay | Admitting: *Deleted

## 2015-09-27 DIAGNOSIS — O26891 Other specified pregnancy related conditions, first trimester: Secondary | ICD-10-CM | POA: Insufficient documentation

## 2015-09-27 DIAGNOSIS — Z3A11 11 weeks gestation of pregnancy: Secondary | ICD-10-CM | POA: Diagnosis not present

## 2015-09-27 DIAGNOSIS — J09X2 Influenza due to identified novel influenza A virus with other respiratory manifestations: Secondary | ICD-10-CM | POA: Diagnosis not present

## 2015-09-27 DIAGNOSIS — Z833 Family history of diabetes mellitus: Secondary | ICD-10-CM | POA: Diagnosis not present

## 2015-09-27 DIAGNOSIS — Z8249 Family history of ischemic heart disease and other diseases of the circulatory system: Secondary | ICD-10-CM | POA: Insufficient documentation

## 2015-09-27 DIAGNOSIS — O34219 Maternal care for unspecified type scar from previous cesarean delivery: Secondary | ICD-10-CM | POA: Insufficient documentation

## 2015-09-27 DIAGNOSIS — J101 Influenza due to other identified influenza virus with other respiratory manifestations: Secondary | ICD-10-CM | POA: Diagnosis not present

## 2015-09-27 DIAGNOSIS — R509 Fever, unspecified: Secondary | ICD-10-CM | POA: Diagnosis present

## 2015-09-27 HISTORY — DX: Gestational diabetes mellitus in pregnancy, unspecified control: O24.419

## 2015-09-27 HISTORY — DX: Unspecified asthma, uncomplicated: J45.909

## 2015-09-27 LAB — CBC
HEMATOCRIT: 35.1 % — AB (ref 36.0–46.0)
HEMOGLOBIN: 12.4 g/dL (ref 12.0–15.0)
MCH: 32 pg (ref 26.0–34.0)
MCHC: 35.3 g/dL (ref 30.0–36.0)
MCV: 90.7 fL (ref 78.0–100.0)
Platelets: 166 10*3/uL (ref 150–400)
RBC: 3.87 MIL/uL (ref 3.87–5.11)
RDW: 12.9 % (ref 11.5–15.5)
WBC: 5.7 10*3/uL (ref 4.0–10.5)

## 2015-09-27 LAB — URINALYSIS, ROUTINE W REFLEX MICROSCOPIC
BILIRUBIN URINE: NEGATIVE
GLUCOSE, UA: NEGATIVE mg/dL
KETONES UR: 40 mg/dL — AB
Leukocytes, UA: NEGATIVE
Nitrite: NEGATIVE
PROTEIN: NEGATIVE mg/dL
Specific Gravity, Urine: 1.015 (ref 1.005–1.030)
pH: 5.5 (ref 5.0–8.0)

## 2015-09-27 LAB — INFLUENZA PANEL BY PCR (TYPE A & B)
H1N1FLUPCR: DETECTED — AB
INFLAPCR: POSITIVE — AB
INFLBPCR: NEGATIVE

## 2015-09-27 LAB — URINE MICROSCOPIC-ADD ON: Bacteria, UA: NONE SEEN

## 2015-09-27 MED ORDER — OSELTAMIVIR PHOSPHATE 75 MG PO CAPS
75.0000 mg | ORAL_CAPSULE | Freq: Once | ORAL | Status: AC
  Administered 2015-09-27: 75 mg via ORAL
  Filled 2015-09-27: qty 1

## 2015-09-27 MED ORDER — DEXTROSE 5 % IN LACTATED RINGERS IV BOLUS
1000.0000 mL | Freq: Once | INTRAVENOUS | Status: AC
  Administered 2015-09-27: 1000 mL via INTRAVENOUS

## 2015-09-27 MED ORDER — METOCLOPRAMIDE HCL 5 MG/ML IJ SOLN
10.0000 mg | Freq: Once | INTRAMUSCULAR | Status: AC
  Administered 2015-09-27: 10 mg via INTRAVENOUS
  Filled 2015-09-27: qty 2

## 2015-09-27 MED ORDER — LACTATED RINGERS IV SOLN
INTRAVENOUS | Status: DC
  Administered 2015-09-27: 20:00:00 via INTRAVENOUS

## 2015-09-27 MED ORDER — PROMETHAZINE HCL 25 MG PO TABS: 25.0000 mg | ORAL_TABLET | Freq: Four times a day (QID) | ORAL | Status: DC | PRN

## 2015-09-27 MED ORDER — DEXAMETHASONE SODIUM PHOSPHATE 10 MG/ML IJ SOLN
10.0000 mg | Freq: Once | INTRAMUSCULAR | Status: AC
  Administered 2015-09-27: 10 mg via INTRAVENOUS
  Filled 2015-09-27: qty 1

## 2015-09-27 MED ORDER — DM-GUAIFENESIN ER 30-600 MG PO TB12
1.0000 | ORAL_TABLET | Freq: Once | ORAL | Status: AC
  Administered 2015-09-27: 1 via ORAL
  Filled 2015-09-27: qty 1

## 2015-09-27 MED ORDER — OSELTAMIVIR PHOSPHATE 75 MG PO CAPS: 75.0000 mg | ORAL_CAPSULE | Freq: Two times a day (BID) | ORAL | Status: DC

## 2015-09-27 MED ORDER — DM-GUAIFENESIN ER 30-600 MG PO TB12
1.0000 | ORAL_TABLET | Freq: Once | ORAL | Status: DC
  Filled 2015-09-27: qty 1

## 2015-09-27 MED ORDER — PROMETHAZINE HCL 25 MG/ML IJ SOLN
25.0000 mg | Freq: Once | INTRAMUSCULAR | Status: AC
  Administered 2015-09-27: 25 mg via INTRAVENOUS
  Filled 2015-09-27: qty 1

## 2015-09-27 NOTE — MAU Note (Addendum)
Has had fever last 2 days. ?101. (initially said 110)  Pain in back for 3 days. Frequency and pain with urination.  Pt is drinking, unable to eat, throws up.  Body shakes, chills.  Has headache. Denies sore throat.  Had pain in back longer, just worse last couple days

## 2015-09-27 NOTE — Discharge Instructions (Signed)

## 2015-09-27 NOTE — MAU Provider Note (Signed)
History     CSN: [id number]  Arrival date and time: 09/27/15 1601   None     Chief Complaint  Patient presents with  . Fever   HPI  Pt is [id number] @[redacted]w[redacted]d  pregnant Presents with cough, chills fever 101 for 3 days and nausea and vomiting. Pt has headache frontal and occipital- has taken Tylenol with limited help. Pt denies sore throat. Pt has had spotting and has been evaluated recently Pt has low back pain, but no pain with urination    Expand All Collapse All   Has had fever last 2 days. ?101. (initially said 110) Pain in back for 3 days. Frequency and pain with urination. Pt is drinking, unable to eat, throws up. Body shakes, chills. Has headache. Denies sore throat. Had pain in back longer, just worse last couple days       Past Medical History  Diagnosis Date  . GERD (gastroesophageal reflux disease)     no meds  . Diabetes mellitus     gestational    Past Surgical History  Procedure Laterality Date  . Tonsillectomy      age 61  . Cesarean section  04/26/2011    Procedure: CESAREAN SECTION;  Surgeon: Donnamae Jude, MD;  Location: [facility];  Service: Gynecology;  Laterality: N/A;    Family History  Problem Relation Age of Onset  . Hypertension Mother   . Heart disease Father   . Diabetes Father   . Diabetes Paternal Grandfather     Social History  Substance Use Topics  . Smoking status: Never Smoker   . Smokeless tobacco: Never Used  . Alcohol Use: No    Allergies: No Known Allergies  Prescriptions prior to admission  Medication Sig Dispense Refill Last Dose  . acetaminophen (TYLENOL) 500 MG tablet Take 500 mg by mouth every 6 (six) hours as needed for mild pain.   09/27/2015 at Unknown time  . meclizine (ANTIVERT) 25 MG tablet Take 1 tablet (25 mg total) by mouth 3 (three) times daily as needed for dizziness. 30 tablet 0 Past Week at Unknown time  . Prenatal Vit-Fe Fumarate-FA (PRENATAL VITAMINS PLUS) 27-1 MG TABS Take 1 tablet by mouth daily. 30  tablet 11 09/26/2015 at Unknown time    Review of Systems  Constitutional: Positive for fever and chills.  HENT: Negative for sore throat.   Respiratory: Positive for cough and sputum production.        Yellow sputum  Gastrointestinal: Positive for nausea and vomiting. Negative for abdominal pain, diarrhea and constipation.  Genitourinary: Negative for dysuria and urgency.  Musculoskeletal: Positive for back pain.  Neurological: Positive for headaches.   Physical Exam   Blood pressure 118/84, pulse 118, temperature 99.5 F (37.5 C), temperature source Oral, resp. rate 22, last menstrual period 07/07/2015, SpO2 99 %.  Physical Exam  Constitutional: She is oriented to person, place, and time. She appears well-developed and well-nourished. No distress.  HENT:  Head: Normocephalic.  Eyes: Pupils are equal, round, and reactive to light.  Neck: Normal range of motion. Neck supple.  Cardiovascular: Normal rate.   Respiratory: Effort normal and breath sounds normal. No respiratory distress. She has no wheezes. She has no rales.  GI: Soft. She exhibits no distension. There is no tenderness. There is no rebound and no guarding.  Musculoskeletal: Normal range of motion.  Neurological: She is alert and oriented to person, place, and time.  Skin: Skin is warm and dry.  Psychiatric: She has a normal  mood and affect.    MAU Course  Procedures Results for orders placed or performed during the hospital encounter of 09/27/15 (from the past 24 hour(s))  Influenza panel by PCR (type A & B, H1N1)     Status: Abnormal   Collection Time: 09/27/15  4:55 PM  Result Value Ref Range   Influenza A By PCR POSITIVE (A) NEGATIVE   Influenza B By PCR NEGATIVE NEGATIVE   H1N1 flu by pcr DETECTED (A) NOT DETECTED  Urinalysis, Routine w reflex microscopic (not at [facility])     Status: Abnormal   Collection Time: 09/27/15  5:00 PM  Result Value Ref Range   Color, Urine YELLOW YELLOW   APPearance CLEAR CLEAR    Specific Gravity, Urine 1.015 1.005 - 1.030   pH 5.5 5.0 - 8.0   Glucose, UA NEGATIVE NEGATIVE mg/dL   Hgb urine dipstick MODERATE (A) NEGATIVE   Bilirubin Urine NEGATIVE NEGATIVE   Ketones, ur 40 (A) NEGATIVE mg/dL   Protein, ur NEGATIVE NEGATIVE mg/dL   Nitrite NEGATIVE NEGATIVE   Leukocytes, UA NEGATIVE NEGATIVE  Urine microscopic-add on     Status: Abnormal   Collection Time: 09/27/15  5:00 PM  Result Value Ref Range   Squamous Epithelial / LPF 0-5 (A) NONE SEEN   WBC, UA 0-5 0 - 5 WBC/hpf   RBC / HPF 0-5 0 - 5 RBC/hpf   Bacteria, UA NONE SEEN NONE SEEN  CBC     Status: Abnormal   Collection Time: 09/27/15  5:59 PM  Result Value Ref Range   WBC 5.7 4.0 - 10.5 K/uL   RBC 3.87 3.87 - 5.11 MIL/uL   Hemoglobin 12.4 12.0 - 15.0 g/dL   HCT 35.1 (L) 36.0 - 46.0 %   MCV 90.7 78.0 - 100.0 fL   MCH 32.0 26.0 - 34.0 pg   MCHC 35.3 30.0 - 36.0 g/dL   RDW 12.9 11.5 - 15.5 %   Platelets 166 150 - 400 K/uL  IVF D5LR ; phenergan 25mg  IV- helped congestion and cough Headache persisted- Reglan and Decadron given IV with improvement of headache Mucinex DM given + Influenza A and H1N1- [place] 1st dose given in MAU FHR 190 bpm  Assessment and Plan  Influenza A and H1N1 in pregnancy- Tamiflu RX Cough, congestion- Mucinex DM Rx Low grade temp- tylenol Rx for phenergan 25mg  tablets for nausea and cough F/u with clinic appointment as scheduled  Aileena Iglesia 09/27/2015, 5:42 PM

## 2015-09-28 LAB — CULTURE, OB URINE: Culture: NO GROWTH

## 2015-09-29 LAB — RESPIRATORY VIRUS PANEL
Adenovirus: NEGATIVE
INFLUENZA A: POSITIVE — AB
INFLUENZA B 1: NEGATIVE
Metapneumovirus: NEGATIVE
PARAINFLUENZA 1 A: NEGATIVE
PARAINFLUENZA 2 A: NEGATIVE
PARAINFLUENZA 3 A: NEGATIVE
Respiratory Syncytial Virus A: NEGATIVE
Respiratory Syncytial Virus B: NEGATIVE
Rhinovirus: NEGATIVE

## 2015-10-03 ENCOUNTER — Other Ambulatory Visit (HOSPITAL_COMMUNITY)
Admission: RE | Admit: 2015-10-03 | Discharge: 2015-10-03 | Disposition: A | Discharge status: Home / Self Care | Payer: Medicaid Other | Source: Ambulatory Visit | Attending: Advanced Practice Midwife | Admitting: Advanced Practice Midwife

## 2015-10-03 ENCOUNTER — Encounter: Payer: Self-pay | Admitting: Family Medicine

## 2015-10-03 ENCOUNTER — Encounter: Payer: Self-pay | Admitting: Advanced Practice Midwife

## 2015-10-03 ENCOUNTER — Ambulatory Visit (INDEPENDENT_AMBULATORY_CARE_PROVIDER_SITE_OTHER): Payer: Medicaid Other | Admitting: Advanced Practice Midwife

## 2015-10-03 VITALS — BP 108/63 | HR 80 | Wt 137.1 lb

## 2015-10-03 DIAGNOSIS — Z01419 Encounter for gynecological examination (general) (routine) without abnormal findings: Secondary | ICD-10-CM | POA: Insufficient documentation

## 2015-10-03 DIAGNOSIS — Z113 Encounter for screening for infections with a predominantly sexual mode of transmission: Secondary | ICD-10-CM | POA: Diagnosis not present

## 2015-10-03 DIAGNOSIS — Z3481 Encounter for supervision of other normal pregnancy, first trimester: Secondary | ICD-10-CM

## 2015-10-03 DIAGNOSIS — Z124 Encounter for screening for malignant neoplasm of cervix: Secondary | ICD-10-CM | POA: Diagnosis not present

## 2015-10-03 DIAGNOSIS — Z8632 Personal history of gestational diabetes: Secondary | ICD-10-CM | POA: Insufficient documentation

## 2015-10-03 DIAGNOSIS — O99611 Diseases of the digestive system complicating pregnancy, first trimester: Secondary | ICD-10-CM

## 2015-10-03 DIAGNOSIS — O0993 Supervision of high risk pregnancy, unspecified, third trimester: Secondary | ICD-10-CM | POA: Insufficient documentation

## 2015-10-03 DIAGNOSIS — O34219 Maternal care for unspecified type scar from previous cesarean delivery: Secondary | ICD-10-CM | POA: Diagnosis not present

## 2015-10-03 DIAGNOSIS — O09521 Supervision of elderly multigravida, first trimester: Secondary | ICD-10-CM | POA: Diagnosis not present

## 2015-10-03 DIAGNOSIS — K219 Gastro-esophageal reflux disease without esophagitis: Secondary | ICD-10-CM

## 2015-10-03 DIAGNOSIS — Z1151 Encounter for screening for human papillomavirus (HPV): Secondary | ICD-10-CM | POA: Diagnosis not present

## 2015-10-03 DIAGNOSIS — O09529 Supervision of elderly multigravida, unspecified trimester: Secondary | ICD-10-CM | POA: Insufficient documentation

## 2015-10-03 DIAGNOSIS — Z8639 Personal history of other endocrine, nutritional and metabolic disease: Secondary | ICD-10-CM | POA: Insufficient documentation

## 2015-10-03 DIAGNOSIS — O09291 Supervision of pregnancy with other poor reproductive or obstetric history, first trimester: Secondary | ICD-10-CM | POA: Diagnosis present

## 2015-10-03 LAB — POCT URINALYSIS DIP (DEVICE)
BILIRUBIN URINE: NEGATIVE
Glucose, UA: NEGATIVE mg/dL
HGB URINE DIPSTICK: NEGATIVE
Ketones, ur: NEGATIVE mg/dL
Leukocytes, UA: NEGATIVE
NITRITE: NEGATIVE
PH: 6 (ref 5.0–8.0)
Protein, ur: NEGATIVE mg/dL
SPECIFIC GRAVITY, URINE: 1.025 (ref 1.005–1.030)
Urobilinogen, UA: 0.2 mg/dL (ref 0.0–1.0)

## 2015-10-03 LAB — GLUCOSE TOLERANCE, 1 HOUR (50G) W/O FASTING: Glucose, 1 Hour GTT: 133 mg/dL (ref 70–140)

## 2015-10-03 MED ORDER — FAMOTIDINE 20 MG PO TABS: 20.0000 mg | ORAL_TABLET | Freq: Two times a day (BID) | ORAL | Status: DC

## 2015-10-03 NOTE — Progress Notes (Signed)
Early gtt due to history of GDM.  Positive for flu one week ago.

## 2015-10-03 NOTE — Patient Instructions (Addendum)

## 2015-10-03 NOTE — Progress Notes (Signed)
  Subjective:    Sylvia Best is being seen today for her first obstetrical visit.  She is at [redacted]w[redacted]d gestation by LMP/6.1 weeks [place]. Her obstetrical history is significant for obesity and C/S for twins in 2012. Relationship with FOB: spouse, living together. Patient does intend to breast feed. Pregnancy history fully reviewed.  Patient reports no complaints.  Review of Systems:   Review of Systems  Objective:     BP 108/63 mmHg  Pulse 80  Wt 137 lb 2 oz (62.2 kg)  LMP 07/07/2015 Physical Exam  Exam  Uterine Size: size equals dates  Pelvic Exam:    Perineum: No Hemorrhoids, Normal Perineum   Vulva: normal   Vagina:  normal mucosa, normal discharge, no palpable nodules   pH: Not done   Cervix: no bleeding following Pap, no cervical motion tenderness and no lesions   Adnexa: normal adnexa and no mass, fullness, tenderness   Bony Pelvis: Adequate  System: Breast:  Declined   Skin: normal coloration and turgor, no rashes    Neurologic: negative   Extremities: normal strength, tone, and muscle mass   HEENT neck supple with midline trachea and thyroid without masses   Mouth/Teeth mucous membranes moist, pharynx normal without lesions   Neck supple and no masses   Cardiovascular: regular rate and rhythm, no murmurs or gallops   Respiratory:  appears well, vitals normal, no respiratory distress, acyanotic, normal RR, chest clear, no wheezing, crepitations, rhonchi, normal symmetric air entry   Abdomen: soft, non-tender; bowel sounds normal; no masses,  no organomegaly   Urinary: urethral meatus normal      Assessment:    Pregnancy: [id number] Patient Active Problem List   Diagnosis Date Noted  . Supervision of normal pregnancy, antepartum 10/03/2015  . Previous cesarean delivery affecting pregnancy, antepartum 10/03/2015  . History of gestational diabetes in prior pregnancy, currently pregnant 10/03/2015  . AMA (advanced maternal age) multigravida 35+ 10/03/2015     1. History  of gestational diabetes in prior pregnancy, currently pregnant, first trimester  - Prenatal Profile - Culture, OB Urine - Prescript Monitor Profile(19) - Cytology - PAP - GC/Chlamydia probe amp ([place])not at [facility] - Glucose Tolerance, 1 HR (50g) w/o Fasting  2. Supervision of normal pregnancy, antepartum, first trimester   3. Previous cesarean delivery affecting pregnancy, antepartum  - Discussed VBAC vs RCS. Undecided  4. AMA (advanced maternal age) multigravida 9+, first trimester  - First trimester screen ordered - Declined genetic counseling and NIPS   Plan:     Initial labs drawn. Prenatal vitamins. Problem list reviewed and updated. First trimester screen discussed: ordered. Role of ultrasound in pregnancy discussed; fetal survey: requested. Amniocentesis discussed: declined. Follow up in 4 weeks.  Sylvia Best 10/03/2015

## 2015-10-04 LAB — PRENATAL PROFILE (SOLSTAS)
Antibody Screen: NEGATIVE
BASOS ABS: 0 10*3/uL (ref 0.0–0.1)
Basophils Relative: 0 % (ref 0–1)
EOS PCT: 2 % (ref 0–5)
Eosinophils Absolute: 0.1 10*3/uL (ref 0.0–0.7)
HCT: 32 % — ABNORMAL LOW (ref 36.0–46.0)
HIV: NONREACTIVE
Hemoglobin: 10.9 g/dL — ABNORMAL LOW (ref 12.0–15.0)
Hepatitis B Surface Ag: NEGATIVE
LYMPHS ABS: 2.2 10*3/uL (ref 0.7–4.0)
LYMPHS PCT: 40 % (ref 12–46)
MCH: 31.7 pg (ref 26.0–34.0)
MCHC: 34.1 g/dL (ref 30.0–36.0)
MCV: 93 fL (ref 78.0–100.0)
MONO ABS: 0.3 10*3/uL (ref 0.1–1.0)
MONOS PCT: 6 % (ref 3–12)
MPV: 10.2 fL (ref 8.6–12.4)
Neutro Abs: 2.8 10*3/uL (ref 1.7–7.7)
Neutrophils Relative %: 52 % (ref 43–77)
PLATELETS: 204 10*3/uL (ref 150–400)
RBC: 3.44 MIL/uL — ABNORMAL LOW (ref 3.87–5.11)
RDW: 13.4 % (ref 11.5–15.5)
RH TYPE: POSITIVE
RUBELLA: 4.39 {index} — AB (ref <0.90)
WBC: 5.4 10*3/uL (ref 4.0–10.5)

## 2015-10-04 LAB — PRESCRIPTION MONITORING PROFILE (19 PANEL)
AMPHETAMINE/METH: NEGATIVE ng/mL
BUPRENORPHINE, URINE: NEGATIVE ng/mL
Barbiturate Screen, Urine: NEGATIVE ng/mL
Benzodiazepine Screen, Urine: NEGATIVE ng/mL
CANNABINOID SCRN UR: NEGATIVE ng/mL
CARISOPRODOL, URINE: NEGATIVE ng/mL
COCAINE METABOLITES: NEGATIVE ng/mL
CREATININE, URINE: 99.35 mg/dL (ref >20.0)
Fentanyl, Ur: NEGATIVE ng/mL
MDMA URINE: NEGATIVE ng/mL
MEPERIDINE UR: NEGATIVE ng/mL
METHADONE SCREEN, URINE: NEGATIVE ng/mL
METHAQUALONE SCREEN (URINE): NEGATIVE ng/mL
Nitrites, Initial: NEGATIVE ug/mL
OXYCODONE SCRN UR: NEGATIVE ng/mL
Opiate Screen, Urine: NEGATIVE ng/mL
PH URINE, INITIAL: 5.4 pH (ref 4.5–8.9)
PHENCYCLIDINE, UR: NEGATIVE ng/mL
Propoxyphene: NEGATIVE ng/mL
TAPENTADOLUR: NEGATIVE ng/mL
Tramadol Scrn, Ur: NEGATIVE ng/mL
Zolpidem, Urine: NEGATIVE ng/mL

## 2015-10-04 LAB — GC/CHLAMYDIA PROBE AMP (~~LOC~~) NOT AT ARMC
Chlamydia: NEGATIVE
NEISSERIA GONORRHEA: NEGATIVE

## 2015-10-05 LAB — CULTURE, OB URINE
Colony Count: NO GROWTH
Organism ID, Bacteria: NO GROWTH

## 2015-10-05 LAB — CYTOLOGY - PAP

## 2015-10-11 ENCOUNTER — Other Ambulatory Visit: Payer: Self-pay | Admitting: Advanced Practice Midwife

## 2015-10-11 DIAGNOSIS — Z369 Encounter for antenatal screening, unspecified: Secondary | ICD-10-CM

## 2015-10-11 DIAGNOSIS — K219 Gastro-esophageal reflux disease without esophagitis: Secondary | ICD-10-CM

## 2015-10-11 DIAGNOSIS — O99611 Diseases of the digestive system complicating pregnancy, first trimester: Secondary | ICD-10-CM

## 2015-10-11 DIAGNOSIS — Z8632 Personal history of gestational diabetes: Principal | ICD-10-CM

## 2015-10-11 DIAGNOSIS — O09521 Supervision of elderly multigravida, first trimester: Secondary | ICD-10-CM

## 2015-10-11 DIAGNOSIS — O09291 Supervision of pregnancy with other poor reproductive or obstetric history, first trimester: Secondary | ICD-10-CM

## 2015-10-11 DIAGNOSIS — Z3A13 13 weeks gestation of pregnancy: Secondary | ICD-10-CM

## 2015-10-11 DIAGNOSIS — O34219 Maternal care for unspecified type scar from previous cesarean delivery: Secondary | ICD-10-CM

## 2015-10-12 ENCOUNTER — Ambulatory Visit (HOSPITAL_COMMUNITY)
Admission: RE | Admit: 2015-10-12 | Discharge: 2015-10-12 | Disposition: A | Discharge status: Home / Self Care | Payer: Medicaid Other | Source: Ambulatory Visit | Attending: Advanced Practice Midwife | Admitting: Advanced Practice Midwife

## 2015-10-12 DIAGNOSIS — O34219 Maternal care for unspecified type scar from previous cesarean delivery: Secondary | ICD-10-CM | POA: Insufficient documentation

## 2015-10-12 DIAGNOSIS — O09522 Supervision of elderly multigravida, second trimester: Secondary | ICD-10-CM

## 2015-10-12 DIAGNOSIS — O09292 Supervision of pregnancy with other poor reproductive or obstetric history, second trimester: Secondary | ICD-10-CM | POA: Diagnosis not present

## 2015-10-12 DIAGNOSIS — K219 Gastro-esophageal reflux disease without esophagitis: Secondary | ICD-10-CM

## 2015-10-12 DIAGNOSIS — Z8632 Personal history of gestational diabetes: Secondary | ICD-10-CM

## 2015-10-12 DIAGNOSIS — O09291 Supervision of pregnancy with other poor reproductive or obstetric history, first trimester: Secondary | ICD-10-CM

## 2015-10-12 DIAGNOSIS — Z36 Encounter for antenatal screening of mother: Secondary | ICD-10-CM | POA: Diagnosis not present

## 2015-10-12 DIAGNOSIS — Z3A13 13 weeks gestation of pregnancy: Secondary | ICD-10-CM | POA: Insufficient documentation

## 2015-10-12 DIAGNOSIS — Z369 Encounter for antenatal screening, unspecified: Secondary | ICD-10-CM

## 2015-10-12 DIAGNOSIS — O99611 Diseases of the digestive system complicating pregnancy, first trimester: Secondary | ICD-10-CM

## 2015-10-12 DIAGNOSIS — O09521 Supervision of elderly multigravida, first trimester: Secondary | ICD-10-CM

## 2015-10-20 ENCOUNTER — Encounter: Payer: Self-pay | Admitting: *Deleted

## 2015-10-20 DIAGNOSIS — Z3480 Encounter for supervision of other normal pregnancy, unspecified trimester: Secondary | ICD-10-CM

## 2015-10-24 ENCOUNTER — Other Ambulatory Visit (HOSPITAL_COMMUNITY): Payer: Self-pay

## 2015-10-31 ENCOUNTER — Encounter: Payer: Self-pay | Admitting: Advanced Practice Midwife

## 2015-10-31 ENCOUNTER — Ambulatory Visit (INDEPENDENT_AMBULATORY_CARE_PROVIDER_SITE_OTHER): Payer: Medicaid Other | Admitting: Advanced Practice Midwife

## 2015-10-31 VITALS — BP 113/77 | HR 79 | Temp 98.4°F | Wt 139.5 lb

## 2015-10-31 DIAGNOSIS — O9989 Other specified diseases and conditions complicating pregnancy, childbirth and the puerperium: Secondary | ICD-10-CM | POA: Diagnosis not present

## 2015-10-31 DIAGNOSIS — O09522 Supervision of elderly multigravida, second trimester: Secondary | ICD-10-CM | POA: Diagnosis present

## 2015-10-31 DIAGNOSIS — Z3480 Encounter for supervision of other normal pregnancy, unspecified trimester: Secondary | ICD-10-CM

## 2015-10-31 DIAGNOSIS — Z603 Acculturation difficulty: Secondary | ICD-10-CM | POA: Diagnosis not present

## 2015-10-31 DIAGNOSIS — R1033 Periumbilical pain: Secondary | ICD-10-CM | POA: Diagnosis not present

## 2015-10-31 LAB — POCT URINALYSIS DIP (DEVICE)
Bilirubin Urine: NEGATIVE
GLUCOSE, UA: NEGATIVE mg/dL
KETONES UR: NEGATIVE mg/dL
Leukocytes, UA: NEGATIVE
NITRITE: NEGATIVE
PH: 5.5 (ref 5.0–8.0)
PROTEIN: NEGATIVE mg/dL
Specific Gravity, Urine: 1.02 (ref 1.005–1.030)
Urobilinogen, UA: 0.2 mg/dL (ref 0.0–1.0)

## 2015-10-31 NOTE — Patient Instructions (Signed)

## 2015-10-31 NOTE — Progress Notes (Signed)
Interpreter Nuha Mohammad  Pain- lower back, sharp pain @ umbilicus  Pt report numbness in hand/lips and side when she is lying from waking up Educated pt on skin to skin

## 2015-10-31 NOTE — Progress Notes (Signed)
Subjective:  Sylvia Best is a 35 y.o. [id number] at [redacted]w[redacted]d being seen today for ongoing prenatal care.  She is currently monitored for the following issues for this low-risk pregnancy and has Supervision of normal pregnancy, antepartum; Previous cesarean delivery affecting pregnancy, antepartum; History of gestational diabetes in prior pregnancy, currently pregnant; AMA (advanced maternal age) multigravida [id number]; and Umbilical pain on her problem list.  Patient reports tingling of hand and lips in AM upon arising, then goes away. Also c/o pain at umbilicus.   States umbilicus is tender to touch  Contractions: Not present. Vag. Bleeding: None.  Movement: Present. Denies leaking of fluid.   The following portions of the patient's history were reviewed and updated as appropriate: allergies, current medications, past family history, past medical history, past social history, past surgical history and problem list. Problem list updated.  Objective:   Filed Vitals:   10/31/15 1551  BP: 113/77  Pulse: 79  Temp: 98.4 F (36.9 C)  Weight: 139 lb 8 oz (63.277 kg)    Fetal Status: Fetal Heart Rate (bpm): 153   Movement: Present     General:  Alert, oriented and cooperative. Patient is in no acute distress.  Skin: Skin is warm and dry. No rash noted.   Cardiovascular: Normal heart rate noted  Respiratory: Normal respiratory effort, no problems with respiration noted  Abdomen: Soft, gravid, appropriate for gestational age. Pain/Pressure: Present   Tiny area of bulging at 1:oo on umbilicus, tender to touch  Pelvic: Vag. Bleeding: None     Cervical exam deferred        Extremities: Normal range of motion.  Edema: None  Normal sensation and motor movement of bilateral arms.   Mental Status: Normal mood and affect. Normal behavior. Normal judgment and thought content.   Urinalysis: Urine Protein: Negative Urine Glucose: Negative  Assessment and Plan:  Pregnancy: [id number] at [redacted]w[redacted]d  1. AMA (advanced  maternal age) multigravida 83+, second trimester     AFP only today - Alpha fetoprotein, maternal       Has anatomy US already scheduled  2. Umbilical pain     Will observe. Discussed if it becomes a hernia, would refer to surgery, who would probably defer repair until after pregnancy. Right now I don't think it is significant.  3.  Hand and lip/head tingling      Discussed probably nerve getting pinched. Need to get a good supportive pillow which permits neutral head position  Preterm labor symptoms and general obstetric precautions including but not limited to vaginal bleeding, contractions, leaking of fluid and fetal movement were reviewed in detail with the patient. Please refer to After Visit Summary for other counseling recommendations.   RTC 4 weeks   Seabron Spates, CNM

## 2015-10-31 NOTE — Progress Notes (Signed)
Home Medicaid Form Completed  

## 2015-11-01 LAB — ALPHA FETOPROTEIN, MATERNAL
AFP: 69.8 ng/mL
Curr Gest Age: 16.4 wks.days
MOM FOR AFP: 1.62
Open Spina bifida: NEGATIVE
Osb Risk: 1:3940 {titer}

## 2015-11-14 ENCOUNTER — Encounter (HOSPITAL_COMMUNITY): Payer: Self-pay

## 2015-11-14 ENCOUNTER — Other Ambulatory Visit (HOSPITAL_COMMUNITY): Payer: Self-pay | Admitting: Maternal and Fetal Medicine

## 2015-11-14 ENCOUNTER — Ambulatory Visit (HOSPITAL_COMMUNITY)
Admission: RE | Admit: 2015-11-14 | Discharge: 2015-11-14 | Disposition: A | Discharge status: Home / Self Care | Payer: Medicaid Other | Source: Ambulatory Visit | Attending: Advanced Practice Midwife | Admitting: Advanced Practice Midwife

## 2015-11-14 VITALS — BP 113/72 | HR 84 | Wt 144.8 lb

## 2015-11-14 DIAGNOSIS — Z3A18 18 weeks gestation of pregnancy: Secondary | ICD-10-CM | POA: Diagnosis not present

## 2015-11-14 DIAGNOSIS — O09292 Supervision of pregnancy with other poor reproductive or obstetric history, second trimester: Secondary | ICD-10-CM

## 2015-11-14 DIAGNOSIS — Z36 Encounter for antenatal screening of mother: Secondary | ICD-10-CM | POA: Insufficient documentation

## 2015-11-14 DIAGNOSIS — O34219 Maternal care for unspecified type scar from previous cesarean delivery: Secondary | ICD-10-CM | POA: Diagnosis not present

## 2015-11-14 DIAGNOSIS — O09522 Supervision of elderly multigravida, second trimester: Secondary | ICD-10-CM

## 2015-11-14 DIAGNOSIS — Z3689 Encounter for other specified antenatal screening: Secondary | ICD-10-CM

## 2015-11-14 DIAGNOSIS — Z8632 Personal history of gestational diabetes: Secondary | ICD-10-CM

## 2015-11-14 DIAGNOSIS — Z0489 Encounter for examination and observation for other specified reasons: Secondary | ICD-10-CM

## 2015-11-14 DIAGNOSIS — IMO0002 Reserved for concepts with insufficient information to code with codable children: Secondary | ICD-10-CM

## 2015-11-28 ENCOUNTER — Encounter: Payer: Self-pay | Admitting: Advanced Practice Midwife

## 2015-11-28 ENCOUNTER — Encounter: Payer: Self-pay | Admitting: Obstetrics & Gynecology

## 2015-11-28 ENCOUNTER — Ambulatory Visit (INDEPENDENT_AMBULATORY_CARE_PROVIDER_SITE_OTHER): Payer: Medicaid Other | Admitting: Advanced Practice Midwife

## 2015-11-28 VITALS — BP 114/63 | HR 85 | Temp 98.5°F | Wt 145.1 lb

## 2015-11-28 DIAGNOSIS — O09522 Supervision of elderly multigravida, second trimester: Secondary | ICD-10-CM

## 2015-11-28 DIAGNOSIS — O43122 Velamentous insertion of umbilical cord, second trimester: Secondary | ICD-10-CM

## 2015-11-28 LAB — POCT URINALYSIS DIP (DEVICE)
BILIRUBIN URINE: NEGATIVE
GLUCOSE, UA: NEGATIVE mg/dL
KETONES UR: NEGATIVE mg/dL
Leukocytes, UA: NEGATIVE
Nitrite: NEGATIVE
PH: 6 (ref 5.0–8.0)
Protein, ur: NEGATIVE mg/dL
Urobilinogen, UA: 0.2 mg/dL (ref 0.0–1.0)

## 2015-11-28 MED ORDER — NAT-RUL PRENATAL VITAMINS 28-0.8 MG PO TABS: 1.0000 | ORAL_TABLET | Freq: Every day | ORAL | Status: DC

## 2015-11-28 NOTE — Patient Instructions (Signed)
Second Trimester of Pregnancy The second trimester is from week 13 through week 28, months 4 through 6. The second trimester is often a time when you feel your best. Your body has also adjusted to being pregnant, and you begin to feel better physically. Usually, morning sickness has lessened or quit completely, you may have more energy, and you may have an increase in appetite. The second trimester is also a time when the fetus is growing rapidly. At the end of the sixth month, the fetus is about 9 inches long and weighs about 1 pounds. You will likely begin to feel the baby move (quickening) between 18 and 20 weeks of the pregnancy. BODY CHANGES Your body goes through many changes during pregnancy. The changes vary from woman to woman.   Your weight will continue to increase. You will notice your lower abdomen bulging out.  You may begin to get stretch marks on your hips, abdomen, and breasts.  You may develop headaches that can be relieved by medicines approved by your health care provider.  You may urinate more often because the fetus is pressing on your bladder.  You may develop or continue to have heartburn as a result of your pregnancy.  You may develop constipation because certain hormones are causing the muscles that push waste through your intestines to slow down.  You may develop hemorrhoids or swollen, bulging veins (varicose veins).  You may have back pain because of the weight gain and pregnancy hormones relaxing your joints between the bones in your pelvis and as a result of a shift in weight and the muscles that support your balance.  Your breasts will continue to grow and be tender.  Your gums may bleed and may be sensitive to brushing and flossing.  Dark spots or blotches (chloasma, mask of pregnancy) may develop on your face. This will likely fade after the baby is born.  A dark line from your belly button to the pubic area (linea nigra) may appear. This will likely fade  after the baby is born.  You may have changes in your hair. These can include thickening of your hair, rapid growth, and changes in texture. Some women also have hair loss during or after pregnancy, or hair that feels dry or thin. Your hair will most likely return to normal after your baby is born. WHAT TO EXPECT AT YOUR PRENATAL VISITS During a routine prenatal visit:  You will be weighed to make sure you and the fetus are growing normally.  Your blood pressure will be taken.  Your abdomen will be measured to track your baby's growth.  The fetal heartbeat will be listened to.  Any test results from the previous visit will be discussed. Your health care provider may ask you:  How you are feeling.  If you are feeling the baby move.  If you have had any abnormal symptoms, such as leaking fluid, bleeding, severe headaches, or abdominal cramping.  If you are using any tobacco products, including cigarettes, chewing tobacco, and electronic cigarettes.  If you have any questions. Other tests that may be performed during your second trimester include:  Blood tests that check for:  Low iron levels (anemia).  Gestational diabetes (between 24 and 28 weeks).  Rh antibodies.  Urine tests to check for infections, diabetes, or protein in the urine.  An ultrasound to confirm the proper growth and development of the baby.  An amniocentesis to check for possible genetic problems.  Fetal screens for spina bifida   and Down syndrome.  HIV (human immunodeficiency virus) testing. Routine prenatal testing includes screening for HIV, unless you choose not to have this test. HOME CARE INSTRUCTIONS   Avoid all smoking, herbs, alcohol, and unprescribed drugs. These chemicals affect the formation and growth of the baby.  Do not use any tobacco products, including cigarettes, chewing tobacco, and electronic cigarettes. If you need help quitting, ask your health care provider. You may receive  counseling support and other resources to help you quit.  Follow your health care provider's instructions regarding medicine use. There are medicines that are either safe or unsafe to take during pregnancy.  Exercise only as directed by your health care provider. Experiencing uterine cramps is a good sign to stop exercising.  Continue to eat regular, healthy meals.  Wear a good support bra for breast tenderness.  Do not use hot tubs, steam rooms, or saunas.  Wear your seat belt at all times when driving.  Avoid raw meat, uncooked cheese, cat litter boxes, and soil used by cats. These carry germs that can cause birth defects in the baby.  Take your prenatal vitamins.  Take 1500-2000 mg of calcium daily starting at the 20th week of pregnancy until you deliver your baby.  Try taking a stool softener (if your health care provider approves) if you develop constipation. Eat more high-fiber foods, such as fresh vegetables or fruit and whole grains. Drink plenty of fluids to keep your urine clear or pale yellow.  Take warm sitz baths to soothe any pain or discomfort caused by hemorrhoids. Use hemorrhoid cream if your health care provider approves.  If you develop varicose veins, wear support hose. Elevate your feet for 15 minutes, 3-4 times a day. Limit salt in your diet.  Avoid heavy lifting, wear low heel shoes, and practice good posture.  Rest with your legs elevated if you have leg cramps or low back pain.  Visit your dentist if you have not gone yet during your pregnancy. Use a soft toothbrush to brush your teeth and be gentle when you floss.  A sexual relationship may be continued unless your health care provider directs you otherwise.  Continue to go to all your prenatal visits as directed by your health care provider. SEEK MEDICAL CARE IF:   You have dizziness.  You have mild pelvic cramps, pelvic pressure, or nagging pain in the abdominal area.  You have persistent nausea,  vomiting, or diarrhea.  You have a bad smelling vaginal discharge.  You have pain with urination. SEEK IMMEDIATE MEDICAL CARE IF:   You have a fever.  You are leaking fluid from your vagina.  You have spotting or bleeding from your vagina.  You have severe abdominal cramping or pain.  You have rapid weight gain or loss.  You have shortness of breath with chest pain.  You notice sudden or extreme swelling of your face, hands, ankles, feet, or legs.  You have not felt your baby move in over an hour.  You have severe headaches that do not go away with medicine.  You have vision changes.   This information is not intended to replace advice given to you by your health care provider. Make sure you discuss any questions you have with your health care provider.   Document Released: 08/27/2001 Document Revised: 09/23/2014 Document Reviewed: 11/03/2012 Elsevier Interactive Patient Education 2016 [employer]. Prenatal Vitamin and Mineral Combinations (oral solid dosage forms) What is this medicine? PRENATAL VITAMIN AND MINERAL combinations are used before, during,  and after pregnancy to help provide provide good nutrition. This medicine may be used for other purposes; ask your health care provider or pharmacist if you have questions. What should I tell my health care provider before I take this medicine? They need to know if you have any of these conditions: -bleeding or clotting disorder -history of anemia of any type -other chronic health condition -an unusual or allergic reaction to vitamins, minerals, other medicines, foods, dyes, or preservatives How should I use this medicine? Take this medicine by mouth with a glass of water. You can take it with or without food. If it upsets your stomach, take it with food. Chewable prenatal vitamin tablets may be chewed completely before swallowing. Follow the directions on the prescription label. The usual dose is taken once a day. Do not  take your medicine more often than directed. Contact your pediatrician regarding the use of this medicine in children. Special care may be needed. This medicine is intended for females who are pregnant, breast-feeding, or may become pregnant. Overdosage: If you think you have taken too much of this medicine contact a poison control center or emergency room at once. NOTE: This medicine is only for you. Do not share this medicine with others. What if I miss a dose? If you miss a dose, take it as soon as you can. If it is almost time for your next dose, take only that dose. Do not take double or extra doses. What may interact with this medicine? -alendronate -antacids -cefdinir -cefditoren -etidronate -fluoroquinolone antibiotics (examples: ciprofloxacin, gatifloxacin, levofloxacin) -ibandronate -levodopa -risedronate -tetracycline antibiotics (examples: doxycycline, minocycline, tetracycline) -thyroid hormones -warfarin This list may not describe all possible interactions. Give your health care provider a list of all the medicines, herbs, non-prescription drugs, or dietary supplements you use. Also tell them if you smoke, drink alcohol, or use illegal drugs. Some items may interact with your medicine. What should I watch for while using this medicine? See your health care professional for regular checks on your progress. Remember that vitamin and mineral supplements do not replace the need for good nutrition from a balanced diet. Stools commonly change color when vitamins and minerals are taken. Notify your health care professional if this change is alarming or accompanied by other symptoms, like abdominal pain. What side effects may I notice from receiving this medicine? Side effects that you should report to your doctor or health care professional as soon as possible: -allergic reaction such as skin rash or difficulty breathing -vomiting Side effects that usually do not require medical  attention (report to your doctor or health care professional if they continue or are bothersome): -nausea -stomach upset This list may not describe all possible side effects. Call your doctor for medical advice about side effects. You may report side effects to FDA at 1-800-FDA-1088. Where should I keep my medicine? Keep out of the reach of children. Most vitamins and minerals should be stored at controlled room temperature. Check your specific product directions. Protect from heat and moisture. Throw away any unused medicine after the expiration date. NOTE: This sheet is a summary. It may not cover all possible information. If you have questions about this medicine, talk to your doctor, pharmacist, or health care provider.    2016, Elsevier/Gold Standard. (2015-03-30 09:02:38)

## 2015-11-28 NOTE — Progress Notes (Signed)
Subjective:  Sylvia Best is a 35 y.o. [id number] at [redacted]w[redacted]d being seen today for ongoing prenatal care.  She is currently monitored for the following issues for this low-risk pregnancy and has Supervision of normal pregnancy, antepartum; Previous cesarean delivery affecting pregnancy, antepartum; History of gestational diabetes in prior pregnancy, currently pregnant; AMA (advanced maternal age) multigravida [id number]; Umbilical pain; and Language barrier, cultural differences on her problem list.  Patient reports backache and sharp pains at times in back, states vitamins make her dizzy, worried they contain gelatin (pork).   Arm tingling is better.  Umbilical pain is better .  Worried about abnormal ultrasound, specifically the velamentous cord insertion.  Contractions: Not present. Vag. Bleeding: None.  Movement: Present. Denies leaking of fluid.   The following portions of the patient's history were reviewed and updated as appropriate: allergies, current medications, past family history, past medical history, past social history, past surgical history and problem list. Problem list updated.  Objective:   Filed Vitals:   11/28/15 1600  BP: 114/63  Pulse: 85  Temp: 98.5 F (36.9 C)  Weight: 145 lb 1.6 oz (65.817 kg)    Fetal Status: Fetal Heart Rate (bpm): 160   Movement: Present     General:  Alert, oriented and cooperative. Patient is in no acute distress.  Skin: Skin is warm and dry. No rash noted.   Cardiovascular: Normal heart rate noted  Respiratory: Normal respiratory effort, no problems with respiration noted  Abdomen: Soft, gravid, appropriate for gestational age. Pain/Pressure: Absent     Pelvic: Vag. Bleeding: None     Cervical exam deferred        Extremities: Normal range of motion.  Edema: None  Mental Status: Normal mood and affect. Normal behavior. Normal judgment and thought content.   Urinalysis: Urine Protein: Negative Urine Glucose: Negative  Assessment and Plan:   Pregnancy: [id number] at [redacted]w[redacted]d  Long discussion and explanation of velamentous cord insertion. [place] planned 12/12/15 at MFM.  Discussed they may do ultrasounds and other testing as pregnancy progresses to watch growth. Reassured baby is growing well now.  Preterm labor symptoms and general obstetric precautions including but not limited to vaginal bleeding, contractions, leaking of fluid and fetal movement were reviewed in detail with the patient. Please refer to After Visit Summary for other counseling recommendations.  Return in about 4 weeks (around 12/26/2015) for [facility].   Seabron Spates, CNM

## 2015-11-28 NOTE — Progress Notes (Signed)
Interpreter Benjaman Lobe present for encounter.  Pt has question about her ultrasound results.

## 2015-11-29 ENCOUNTER — Telehealth: Payer: Self-pay | Admitting: Advanced Practice Midwife

## 2015-11-29 NOTE — Telephone Encounter (Signed)
Called patient to inform her of her appointment being moved. Called with Interpreter, and she stated she understood.

## 2015-12-05 ENCOUNTER — Ambulatory Visit (INDEPENDENT_AMBULATORY_CARE_PROVIDER_SITE_OTHER): Payer: Medicaid Other | Admitting: Obstetrics and Gynecology

## 2015-12-05 VITALS — BP 111/66 | HR 66 | Temp 98.7°F | Wt 147.2 lb

## 2015-12-05 DIAGNOSIS — R42 Dizziness and giddiness: Secondary | ICD-10-CM | POA: Insufficient documentation

## 2015-12-05 DIAGNOSIS — O9989 Other specified diseases and conditions complicating pregnancy, childbirth and the puerperium: Secondary | ICD-10-CM

## 2015-12-05 LAB — POCT URINALYSIS DIP (DEVICE)
Bilirubin Urine: NEGATIVE
Glucose, UA: NEGATIVE mg/dL
HGB URINE DIPSTICK: NEGATIVE
KETONES UR: NEGATIVE mg/dL
Nitrite: NEGATIVE
PH: 6 (ref 5.0–8.0)
PROTEIN: NEGATIVE mg/dL
Specific Gravity, Urine: 1.025 (ref 1.005–1.030)
UROBILINOGEN UA: 1 mg/dL (ref 0.0–1.0)

## 2015-12-05 LAB — GLUCOSE, CAPILLARY: Glucose-Capillary: 120 mg/dL — ABNORMAL HIGH (ref 65–99)

## 2015-12-05 MED ORDER — FERROUS SULFATE 325 (65 FE) MG PO TABS: 325.0000 mg | ORAL_TABLET | Freq: Once | ORAL | Status: DC

## 2015-12-05 NOTE — Progress Notes (Signed)
Pt is here to discuss her dizzy spells she continues to have.

## 2015-12-05 NOTE — Patient Instructions (Signed)

## 2015-12-05 NOTE — Progress Notes (Signed)
Work in visit for dizzy spell Subjective:  Sylvia Best is a 35 y.o. [id number] at [redacted]w[redacted]d being seen today for ongoing prenatal care.  She is currently monitored for the following issues for this high-risk pregnancy and has Supervision of normal pregnancy, antepartum; Previous cesarean delivery affecting pregnancy, antepartum; History of gestational diabetes in prior pregnancy, currently pregnant; AMA (advanced maternal age) multigravida [id number]; Umbilical pain; and Language barrier, cultural differences on her problem list.  Patient reports dizzy spells since 1 month pregnant. Head feels like it's moving back and forth on changing positions. Denies headache or history of fainting. Eating well and not skipping meals. Drinking well. Denies palpitations   Contractions: Not present. Vag. Bleeding: None.  Movement: Present. Denies leaking of fluid.   The following portions of the patient's history were reviewed and updated as appropriate: allergies, current medications, past family history, past medical history, past social history, past surgical history and problem list. Problem list updated.  Objective:   Filed Vitals:   12/05/15 1547  BP: 111/66  Pulse: 66  Temp: 98.7 F (37.1 C)  Weight: 147 lb 3.2 oz (66.769 kg)    Fetal Status: Fetal Heart Rate (bpm): 152   Movement: Present     General:  Alert, oriented and cooperative. Patient is in no acute distress.  Skin: Skin is warm and dry. No rash noted.   Cardiovascular: Normal heart rate noted RRR w/o murmur  Respiratory: Normal respiratory effort, no problems with respiration noted  Abdomen: Soft, gravid, appropriate for gestational age. Pain/Pressure: Absent     Pelvic: Vag. Bleeding: None     Cervical exam deferred        Extremities: Normal range of motion.     Mental Status: Normal mood and affect. Normal behavior. Normal judgment and thought content.   FS glucose 120 Orthostatic vital signs well WNL  Urinalysis:      Results for orders  placed or performed in visit on 12/05/15 (from the past 24 hour(s))  POCT urinalysis dip (device)     Status: Abnormal   Collection Time: 12/05/15  3:01 PM  Result Value Ref Range   Glucose, UA NEGATIVE NEGATIVE mg/dL   Bilirubin Urine NEGATIVE NEGATIVE   Ketones, ur NEGATIVE NEGATIVE mg/dL   Specific Gravity, Urine 1.025 1.005 - 1.030   Hgb urine dipstick NEGATIVE NEGATIVE   pH 6.0 5.0 - 8.0   Protein, ur NEGATIVE NEGATIVE mg/dL   Urobilinogen, UA 1.0 0.0 - 1.0 mg/dL   Nitrite NEGATIVE NEGATIVE   Leukocytes, UA SMALL (A) NEGATIVE   Results for orders placed or performed in visit on 12/05/15 (from the past 24 hour(s))  POCT urinalysis dip (device)     Status: Abnormal   Collection Time: 12/05/15  3:01 PM  Result Value Ref Range   Glucose, UA NEGATIVE NEGATIVE mg/dL   Bilirubin Urine NEGATIVE NEGATIVE   Ketones, ur NEGATIVE NEGATIVE mg/dL   Specific Gravity, Urine 1.025 1.005 - 1.030   Hgb urine dipstick NEGATIVE NEGATIVE   pH 6.0 5.0 - 8.0   Protein, ur NEGATIVE NEGATIVE mg/dL   Urobilinogen, UA 1.0 0.0 - 1.0 mg/dL   Nitrite NEGATIVE NEGATIVE   Leukocytes, UA SMALL (A) NEGATIVE  Last hgb 10.9  Assessment and Plan:  Pregnancy: [id number] at [redacted]w[redacted]d Orthostatic dizziness     Preterm labor symptoms and general obstetric precautions including but not limited to vaginal bleeding, contractions, leaking of fluid and fetal movement were reviewed in detail with the patient. Please refer to After Visit Summary  for other counseling recommendations. Slow position changes. Small frequent meals. Start iron supplement 325mg  po qd.  Return in about 3 weeks (around 12/26/2015).   Lorene Dy, CNM

## 2015-12-05 NOTE — Progress Notes (Signed)
Orthostatic vitals;  Lying  BP - 99/61, P - 81;  Sitting BP - 105/65, P - 80 ;   Standing BP - 109/64,  P - 93

## 2015-12-12 ENCOUNTER — Encounter (HOSPITAL_COMMUNITY): Payer: Self-pay

## 2015-12-12 ENCOUNTER — Ambulatory Visit (HOSPITAL_COMMUNITY)
Admission: RE | Admit: 2015-12-12 | Discharge: 2015-12-12 | Disposition: A | Discharge status: Home / Self Care | Payer: Medicaid Other | Source: Ambulatory Visit | Attending: Obstetrics & Gynecology | Admitting: Obstetrics & Gynecology

## 2015-12-12 DIAGNOSIS — Z3A22 22 weeks gestation of pregnancy: Secondary | ICD-10-CM | POA: Diagnosis not present

## 2015-12-12 DIAGNOSIS — O34219 Maternal care for unspecified type scar from previous cesarean delivery: Secondary | ICD-10-CM | POA: Insufficient documentation

## 2015-12-12 DIAGNOSIS — O09522 Supervision of elderly multigravida, second trimester: Secondary | ICD-10-CM | POA: Insufficient documentation

## 2015-12-12 DIAGNOSIS — Z36 Encounter for antenatal screening of mother: Secondary | ICD-10-CM | POA: Insufficient documentation

## 2015-12-12 DIAGNOSIS — IMO0002 Reserved for concepts with insufficient information to code with codable children: Secondary | ICD-10-CM

## 2015-12-12 DIAGNOSIS — O09292 Supervision of pregnancy with other poor reproductive or obstetric history, second trimester: Secondary | ICD-10-CM | POA: Insufficient documentation

## 2015-12-12 DIAGNOSIS — Z0489 Encounter for examination and observation for other specified reasons: Secondary | ICD-10-CM

## 2015-12-28 ENCOUNTER — Encounter: Payer: Self-pay | Admitting: Advanced Practice Midwife

## 2015-12-28 ENCOUNTER — Ambulatory Visit (INDEPENDENT_AMBULATORY_CARE_PROVIDER_SITE_OTHER): Payer: Medicaid Other | Admitting: Advanced Practice Midwife

## 2015-12-28 ENCOUNTER — Encounter: Payer: Medicaid Other | Admitting: Obstetrics & Gynecology

## 2015-12-28 VITALS — BP 111/67 | HR 92 | Temp 98.0°F | Wt 149.5 lb

## 2015-12-28 DIAGNOSIS — O09522 Supervision of elderly multigravida, second trimester: Secondary | ICD-10-CM | POA: Diagnosis not present

## 2015-12-28 DIAGNOSIS — R42 Dizziness and giddiness: Secondary | ICD-10-CM

## 2015-12-28 DIAGNOSIS — O26892 Other specified pregnancy related conditions, second trimester: Secondary | ICD-10-CM

## 2015-12-28 DIAGNOSIS — N898 Other specified noninflammatory disorders of vagina: Secondary | ICD-10-CM

## 2015-12-28 DIAGNOSIS — O43122 Velamentous insertion of umbilical cord, second trimester: Secondary | ICD-10-CM

## 2015-12-28 LAB — POCT URINALYSIS DIP (DEVICE)
BILIRUBIN URINE: NEGATIVE
GLUCOSE, UA: NEGATIVE mg/dL
Hgb urine dipstick: NEGATIVE
Ketones, ur: NEGATIVE mg/dL
LEUKOCYTES UA: NEGATIVE
NITRITE: NEGATIVE
Protein, ur: NEGATIVE mg/dL
Specific Gravity, Urine: 1.025 (ref 1.005–1.030)
UROBILINOGEN UA: 1 mg/dL (ref 0.0–1.0)
pH: 6.5 (ref 5.0–8.0)

## 2015-12-28 MED ORDER — MECLIZINE HCL 25 MG PO TABS: 25.0000 mg | ORAL_TABLET | Freq: Three times a day (TID) | ORAL | Status: DC | PRN

## 2015-12-28 NOTE — Progress Notes (Signed)
Interpreter Nuha Muhammad Pain- right leg and calf pain  Request refill on Meclizine for dizziness

## 2015-12-28 NOTE — Patient Instructions (Addendum)
[facility] [place]  Preterm Labor Information Preterm labor is when labor starts at less than 37 weeks of pregnancy. The normal length of a pregnancy is 39 to 41 weeks. CAUSES Often, there is no identifiable underlying cause as to why a woman goes into preterm labor. One of the most common known causes of preterm labor is infection. Infections of the uterus, cervix, vagina, amniotic sac, bladder, kidney, or even the lungs (pneumonia) can cause labor to start. Other suspected causes of preterm labor include:   Urogenital infections, such as yeast infections and bacterial vaginosis.   Uterine abnormalities (uterine shape, uterine septum, fibroids, or bleeding from the placenta).   A cervix that has been operated on (it may fail to stay closed).   Malformations in the fetus.   Multiple gestations (twins, triplets, and so on).   Breakage of the amniotic sac.  RISK FACTORS  Having a previous history of preterm labor.   Having premature rupture of membranes (PROM).   Having a placenta that covers the opening of the cervix (placenta previa).   Having a placenta that separates from the uterus (placental abruption).   Having a cervix that is too weak to hold the fetus in the uterus (incompetent cervix).   Having too much fluid in the amniotic sac (polyhydramnios).   Taking illegal drugs or smoking while pregnant.   Not gaining enough weight while pregnant.   Being younger than 75 and older than 36 years old.   Having a low socioeconomic status.   Being African American. SYMPTOMS Signs and symptoms of preterm labor include:   Menstrual-like cramps, abdominal pain, or back pain.  Uterine contractions that are regular, as frequent as six in an hour, regardless of their intensity (may be mild or painful).  Contractions that start on the top of the uterus and spread down to the lower abdomen and back.   A sense of  increased pelvic pressure.   A watery or bloody mucus discharge that comes from the vagina.  TREATMENT Depending on the length of the pregnancy and other circumstances, your health care provider may suggest bed rest. If necessary, there are medicines that can be given to stop contractions and to mature the fetal lungs. If labor happens before 34 weeks of pregnancy, a prolonged hospital stay may be recommended. Treatment depends on the condition of both you and the fetus.  WHAT SHOULD YOU DO IF YOU THINK YOU ARE IN PRETERM LABOR? Call your health care provider right away. You will need to go to the hospital to get checked immediately. HOW CAN YOU PREVENT PRETERM LABOR IN FUTURE PREGNANCIES? You should:   Stop smoking if you smoke.  Maintain healthy weight gain and avoid chemicals and drugs that are not necessary.  Be watchful for any type of infection.  Inform your health care provider if you have a known history of preterm labor.   This information is not intended to replace advice given to you by your health care provider. Make sure you discuss any questions you have with your health care provider.   Document Released: 11/23/2003 Document Revised: 05/05/2013 Document Reviewed: 10/05/2012 Elsevier Interactive Patient Education 2016 Elsevier Inc.  Sciatica Sciatica is pain, weakness, numbness, or tingling along the path of the sciatic nerve. The nerve starts in the lower back and runs down the back of each leg. The nerve controls the muscles in the lower leg and in the back of the knee, while also providing sensation  to the back of the thigh, lower leg, and the sole of your foot. Sciatica is a symptom of another medical condition. For instance, nerve damage or certain conditions, such as a herniated disk or bone spur on the spine, pinch or put pressure on the sciatic nerve. This causes the pain, weakness, or other sensations normally associated with sciatica. Generally, sciatica only  affects one side of the body. CAUSES   Herniated or slipped disc.  Degenerative disk disease.  A pain disorder involving the narrow muscle in the buttocks (piriformis syndrome).  Pelvic injury or fracture.  Pregnancy.  Tumor (rare). SYMPTOMS  Symptoms can vary from mild to very severe. The symptoms usually travel from the low back to the buttocks and down the back of the leg. Symptoms can include:  Mild tingling or dull aches in the lower back, leg, or hip.  Numbness in the back of the calf or sole of the foot.  Burning sensations in the lower back, leg, or hip.  Sharp pains in the lower back, leg, or hip.  Leg weakness.  Severe back pain inhibiting movement. These symptoms may get worse with coughing, sneezing, laughing, or prolonged sitting or standing. Also, being overweight may worsen symptoms. DIAGNOSIS  Your caregiver will perform a physical exam to look for common symptoms of sciatica. He or she may ask you to do certain movements or activities that would trigger sciatic nerve pain. Other tests may be performed to find the cause of the sciatica. These may include:  Blood tests.  X-rays.  Imaging tests, such as an MRI or CT scan. TREATMENT  Treatment is directed at the cause of the sciatic pain. Sometimes, treatment is not necessary and the pain and discomfort goes away on its own. If treatment is needed, your caregiver may suggest:  Over-the-counter medicines to relieve pain.  Prescription medicines, such as anti-inflammatory medicine, muscle relaxants, or narcotics.  Applying heat or ice to the painful area.  Steroid injections to lessen pain, irritation, and inflammation around the nerve.  Reducing activity during periods of pain.  Exercising and stretching to strengthen your abdomen and improve flexibility of your spine. Your caregiver may suggest losing weight if the extra weight makes the back pain worse.  Physical therapy.  Surgery to eliminate what  is pressing or pinching the nerve, such as a bone spur or part of a herniated disk. HOME CARE INSTRUCTIONS   Only take over-the-counter or prescription medicines for pain or discomfort as directed by your caregiver.  Apply ice to the affected area for 20 minutes, 3-4 times a day for the first 48-72 hours. Then try heat in the same way.  Exercise, stretch, or perform your usual activities if these do not aggravate your pain.  Attend physical therapy sessions as directed by your caregiver.  Keep all follow-up appointments as directed by your caregiver.  Do not wear high heels or shoes that do not provide proper support.  Check your mattress to see if it is too soft. A firm mattress may lessen your pain and discomfort. SEEK IMMEDIATE MEDICAL CARE IF:   You lose control of your bowel or bladder (incontinence).  You have increasing weakness in the lower back, pelvis, buttocks, or legs.  You have redness or swelling of your back.  You have a burning sensation when you urinate.  You have pain that gets worse when you lie down or awakens you at night.  Your pain is worse than you have experienced in the past.  Your pain is lasting longer than 4 weeks.  You are suddenly losing weight without reason. MAKE SURE YOU:  Understand these instructions.  Will watch your condition.  Will get help right away if you are not doing well or get worse.   This information is not intended to replace advice given to you by your health care provider. Make sure you discuss any questions you have with your health care provider.   Document Released: 08/27/2001 Document Revised: 05/24/2015 Document Reviewed: 01/12/2012 Elsevier Interactive Patient Education [employer].

## 2015-12-29 LAB — WET PREP, GENITAL
TRICH WET PREP: NONE SEEN
Yeast Wet Prep HPF POC: NONE SEEN

## 2015-12-31 ENCOUNTER — Other Ambulatory Visit: Payer: Self-pay | Admitting: Advanced Practice Midwife

## 2015-12-31 DIAGNOSIS — N760 Acute vaginitis: Principal | ICD-10-CM

## 2015-12-31 DIAGNOSIS — O43129 Velamentous insertion of umbilical cord, unspecified trimester: Secondary | ICD-10-CM | POA: Insufficient documentation

## 2015-12-31 DIAGNOSIS — B9689 Other specified bacterial agents as the cause of diseases classified elsewhere: Secondary | ICD-10-CM

## 2015-12-31 MED ORDER — METRONIDAZOLE 500 MG PO TABS: 500.0000 mg | ORAL_TABLET | Freq: Two times a day (BID) | ORAL | Status: DC

## 2015-12-31 NOTE — Progress Notes (Signed)
Subjective:  Sylvia Best is a 35 y.o. [id number] at [redacted]w[redacted]d being seen today for ongoing prenatal care.  She is currently monitored for the following issues for this high-risk pregnancy and has Supervision of normal pregnancy, antepartum; Previous cesarean delivery affecting pregnancy, antepartum; History of gestational diabetes in prior pregnancy, currently pregnant; AMA (advanced maternal age) multigravida [id number]; Umbilical pain; Language barrier, cultural differences; and Orthostatic dizziness on her problem list.  Patient reports increased white vaginal discharge, no bleeding, no contractions and Pain radiating down back of right thigh, continued dizziness. Has been having problems with dizziness throughout pregnancy. Was seen in maternity admissions for this problem in January with essentially normal CBC and see minute. Hemoglobin was 10.9. Patient is very worried because she states she was not told about her hemoglobin level at that time. (Although this value is normal for pregnancy, patient was started on iron supplement due to symptom of dizziness.) Patient is also requesting a dental referral.  Contractions: Not present. Vag. Bleeding: None.  Movement: Present. Denies leaking of fluid.   The following portions of the patient's history were reviewed and updated as appropriate: allergies, current medications, past family history, past medical history, past social history, past surgical history and problem list. Problem list updated.  Objective:   Filed Vitals:   12/28/15 1600  BP: 111/67  Pulse: 92  Temp: 98 F (36.7 C)  Weight: 149 lb 8 oz (67.813 kg)    Fetal Status: Fetal Heart Rate (bpm): 153 Fundal Height: 25 cm Movement: Present     General:  Alert, oriented and cooperative. Patient is in no acute distress. No pallor.   Skin: Skin is warm and dry. No rash noted.   Cardiovascular: Normal heart rate noted  Respiratory: Normal respiratory effort, no problems with respiration noted   Abdomen: Soft, gravid, appropriate for gestational age. Pain/Pressure: Present     Pelvic: Vag. Bleeding: None Vag D/C Character: Yellow   Cervical exam deferred        Extremities: Normal range of motion.  Edema: None. Grossly normal sensation and strength bilaterally. No warmth, erythema or tenderness.   Mental Status: Normal mood and affect. Normal behavior. Normal judgment and thought content.   Urinalysis: Urine Protein: Negative Urine Glucose: Negative  Assessment and Plan:  Pregnancy: [id number] at [redacted]w[redacted]d  1. AMA (advanced maternal age) multigravida 44+, second trimester   2. Vaginal discharge during pregnancy, second trimester  - Wet prep, genital  3. Dizziness  - meclizine (ANTIVERT) 25 MG tablet; Take 1 tablet (25 mg total) by mouth 3 (three) times daily as needed for dizziness.  Dispense: 30 tablet; Refill: 2 - Ambulatory referral to Internal Medicine  4. Sciatica  - Comfort measures - Ice/heat when necessary - Maternity support belt  Preterm labor symptoms and general obstetric precautions including but not limited to vaginal bleeding, contractions, leaking of fluid and fetal movement were reviewed in detail with the patient. Please refer to After Visit Summary for other counseling recommendations.  Extremely lengthy conversation with patient about discomforts of pregnancy versus red flags. Also discuss ongoing problem of dizziness. Recommend follow-up with primary care provider. Patient was referral to ear nose throat doctor, although she does not have specific ear complaints. Discussed that she is unlikely to find an ear nose and throat doctor that will take pregnancy Medicaid orbital offer financial assistance. Also discussed that her problem may not be ear-related and suggest that primary care provider will be a better place to start. Referred to [facility] internal medicine.  Return in about 4 weeks (around 01/25/2016).   [name], CNM

## 2015-12-31 NOTE — Progress Notes (Signed)
Dx BV. Rx Flagyl.  

## 2016-01-01 ENCOUNTER — Telehealth: Payer: Self-pay | Admitting: *Deleted

## 2016-01-01 NOTE — Telephone Encounter (Signed)
Per [name], CNM, patient has BX and flagyl has been sent into her pharmacy. Attempted to call patient using Arabic interpreter [phone]. There was no answer. Voice mail was left stating I am calling with non urgent test results, please return my call at the clinic.

## 2016-01-01 NOTE — Telephone Encounter (Signed)
Patient returned call, stated she is ok without interpreter. Wet prep test result given and explained, let her know her abx rx was @ her pharmacy. Patient voiced understanding.

## 2016-01-25 ENCOUNTER — Ambulatory Visit (INDEPENDENT_AMBULATORY_CARE_PROVIDER_SITE_OTHER): Payer: Medicaid Other | Admitting: Advanced Practice Midwife

## 2016-01-25 ENCOUNTER — Encounter: Payer: Self-pay | Admitting: Advanced Practice Midwife

## 2016-01-25 VITALS — BP 97/64 | HR 92 | Temp 98.5°F | Wt 153.2 lb

## 2016-01-25 DIAGNOSIS — O09522 Supervision of elderly multigravida, second trimester: Secondary | ICD-10-CM

## 2016-01-25 DIAGNOSIS — K219 Gastro-esophageal reflux disease without esophagitis: Secondary | ICD-10-CM

## 2016-01-25 LAB — POCT URINALYSIS DIP (DEVICE)
BILIRUBIN URINE: NEGATIVE
Glucose, UA: NEGATIVE mg/dL
HGB URINE DIPSTICK: NEGATIVE
KETONES UR: NEGATIVE mg/dL
Leukocytes, UA: NEGATIVE
NITRITE: NEGATIVE
PH: 6.5 (ref 5.0–8.0)
PROTEIN: NEGATIVE mg/dL
Specific Gravity, Urine: 1.015 (ref 1.005–1.030)
Urobilinogen, UA: 0.2 mg/dL (ref 0.0–1.0)

## 2016-01-25 NOTE — Progress Notes (Signed)
Used interpreter Sylvia Best.  C/o dizziness sometimes , mezclizine helping.

## 2016-01-25 NOTE — Progress Notes (Signed)
Subjective:  Madlyne Quillman is a 35 y.o. [id number] at [redacted]w[redacted]d being seen today for ongoing prenatal care.  She is currently monitored for the following issues for this high-risk pregnancy and has Supervision of normal pregnancy, antepartum; Previous cesarean delivery affecting pregnancy, antepartum; History of gestational diabetes in prior pregnancy, currently pregnant; AMA (advanced maternal age) multigravida [id number]; Umbilical pain; Language barrier, cultural differences; Orthostatic dizziness; and Velamentous insertion of umbilical cord, antepartum on her problem list.  Patient reports feeling shaky at times, admits to not eating frequently , has acid reflux when she eats, not taking  Pepcid.  Contractions: Not present. Vag. Bleeding: None.  Movement: Present. Denies leaking of fluid.   The following portions of the patient's history were reviewed and updated as appropriate: allergies, current medications, past family history, past medical history, past social history, past surgical history and problem list. Problem list updated.  Objective:   Filed Vitals:   01/25/16 1549  BP: 97/64  Pulse: 92  Temp: 98.5 F (36.9 C)  Weight: 153 lb 3.2 oz (69.491 kg)    Fetal Status: Fetal Heart Rate (bpm): 152   Movement: Present     General:  Alert, oriented and cooperative. Patient is in no acute distress.  Skin: Skin is warm and dry. No rash noted.   Cardiovascular: Normal heart rate noted  Respiratory: Normal respiratory effort, no problems with respiration noted  Abdomen: Soft, gravid, appropriate for gestational age. Pain/Pressure: Absent     Pelvic: Vag. Bleeding: None     Cervical exam deferred        Extremities: Normal range of motion.  Edema: Trace  Mental Status: Normal mood and affect. Normal behavior. Normal judgment and thought content.   Urinalysis: Urine Protein: Negative Urine Glucose: Negative  Assessment and Plan:  Pregnancy: [id number] at [redacted]w[redacted]d  1. AMA (advanced maternal age)  multigravida 38+, second trimester     Continue care     Undecided re: tolac Discussed use of Tums or Pepcid, frequent small meals, water intake  Preterm labor symptoms and general obstetric precautions including but not limited to vaginal bleeding, contractions, leaking of fluid and fetal movement were reviewed in detail with the patient. Please refer to After Visit Summary for other counseling recommendations.  Return in about 2 weeks (around 02/08/2016) for [facility].   Seabron Spates, CNM

## 2016-01-25 NOTE — Patient Instructions (Signed)

## 2016-01-29 ENCOUNTER — Other Ambulatory Visit: Payer: Medicaid Other

## 2016-01-30 ENCOUNTER — Other Ambulatory Visit: Payer: Medicaid Other

## 2016-01-30 ENCOUNTER — Ambulatory Visit (INDEPENDENT_AMBULATORY_CARE_PROVIDER_SITE_OTHER): Payer: Medicaid Other | Admitting: Student

## 2016-01-30 ENCOUNTER — Encounter: Payer: Medicaid Other | Admitting: Student

## 2016-01-30 VITALS — BP 109/59 | HR 90 | Wt 154.0 lb

## 2016-01-30 DIAGNOSIS — Z3483 Encounter for supervision of other normal pregnancy, third trimester: Secondary | ICD-10-CM

## 2016-01-30 DIAGNOSIS — Z3482 Encounter for supervision of other normal pregnancy, second trimester: Secondary | ICD-10-CM

## 2016-01-30 LAB — CBC
HCT: 33.5 % — ABNORMAL LOW (ref 35.0–45.0)
HEMOGLOBIN: 11.2 g/dL — AB (ref 11.7–15.5)
MCH: 31.1 pg (ref 27.0–33.0)
MCHC: 33.4 g/dL (ref 32.0–36.0)
MCV: 93.1 fL (ref 80.0–100.0)
MPV: 10.3 fL (ref 7.5–12.5)
PLATELETS: 225 10*3/uL (ref 140–400)
RBC: 3.6 MIL/uL — AB (ref 3.80–5.10)
RDW: 13.3 % (ref 11.0–15.0)
WBC: 8.3 10*3/uL (ref 3.8–10.8)

## 2016-01-30 NOTE — Progress Notes (Signed)
Pt reports pain like something is pushing down in her vagina.

## 2016-01-30 NOTE — Progress Notes (Signed)
  Subjective:  Siyana Tinklenberg is a 35 y.o. [id number] at [redacted]w[redacted]d being seen today for ongoing prenatal care.  She is currently monitored for the following issues for this high-risk pregnancy and has Supervision of normal pregnancy, antepartum; Previous cesarean delivery affecting pregnancy, antepartum; History of gestational diabetes in prior pregnancy, currently pregnant; AMA (advanced maternal age) multigravida [id number]; Umbilical pain; Language barrier, cultural differences; Orthostatic dizziness; and Velamentous insertion of umbilical cord, antepartum on her problem list.  Patient reports contractions & vaginal pressure.  Pt initially here today for 28 week labs but informed nurse that she had been feeling contractions & pressure. Reports several contractions per day (can't quantify) for the last 2 days & feels vaginal pressure as well. Denies LOF or vaginal bleeding. Denies recent intercourse Contractions: Not present. Vag. Bleeding: None.  Movement: Present. Denies leaking of fluid.   The following portions of the patient's history were reviewed and updated as appropriate: allergies, current medications, past family history, past medical history, past social history, past surgical history and problem list. Problem list updated.  Objective:   Filed Vitals:   01/30/16 1024  BP: 109/59  Pulse: 90  Weight: 154 lb (69.854 kg)    Fetal Status: Fetal Heart Rate (bpm): 150   Movement: Present     General:  Alert, oriented and cooperative. Patient is in no acute distress.  Skin: Skin is warm and dry. No rash noted.   Cardiovascular: Normal heart rate noted  Respiratory: Normal respiratory effort, no problems with respiration noted  Abdomen: Soft, gravid, appropriate for gestational age. Pain/Pressure: Present     Pelvic: Vag. Bleeding: None     Cervical exam performed        SVE cl/th/high  Extremities: Normal range of motion.  Edema: None  Mental Status: Normal mood and affect. Normal behavior. Normal  judgment and thought content.   Urinalysis:      Assessment and Plan:  Pregnancy: [id number] at [redacted]w[redacted]d  1. Supervision of normal pregnancy, antepartum, third trimester  2. Braxton hicks contractions   Preterm labor symptoms and general obstetric precautions including but not limited to vaginal bleeding, contractions, leaking of fluid and fetal movement were reviewed in detail with the patient. Please refer to After Visit Summary for other counseling recommendations.  Return in about 2 weeks (around 02/13/2016) for Routine [facility].   Jorje Guild, NP

## 2016-01-30 NOTE — Patient Instructions (Signed)

## 2016-01-31 LAB — HIV ANTIBODY (ROUTINE TESTING W REFLEX): HIV: NONREACTIVE

## 2016-01-31 LAB — GLUCOSE TOLERANCE, 1 HOUR (50G) W/O FASTING: GLUCOSE, 1 HR, GESTATIONAL: 189 mg/dL — AB (ref <140)

## 2016-02-01 LAB — RPR

## 2016-02-05 ENCOUNTER — Telehealth: Payer: Self-pay

## 2016-02-05 NOTE — Telephone Encounter (Signed)
Pt has been notified of lab results and GTT. She has been schedule for appointment on 02/06/2016 for her 3 hour gtt.

## 2016-02-06 ENCOUNTER — Other Ambulatory Visit: Payer: Medicaid Other

## 2016-02-06 DIAGNOSIS — O24419 Gestational diabetes mellitus in pregnancy, unspecified control: Secondary | ICD-10-CM

## 2016-02-07 LAB — GLUCOSE TOLERANCE, 3 HOURS
Glucose Tolerance, 1 hour: 191 mg/dL — ABNORMAL HIGH (ref <190)
Glucose Tolerance, 2 hour: 176 mg/dL — ABNORMAL HIGH (ref <165)
Glucose Tolerance, Fasting: 78 mg/dL (ref 65–104)
Glucose, GTT - 3 Hour: 163 mg/dL — ABNORMAL HIGH (ref <145)

## 2016-02-15 ENCOUNTER — Ambulatory Visit (INDEPENDENT_AMBULATORY_CARE_PROVIDER_SITE_OTHER): Payer: Medicaid Other | Admitting: Medical

## 2016-02-15 VITALS — BP 108/70 | HR 92 | Wt 150.5 lb

## 2016-02-15 DIAGNOSIS — Z23 Encounter for immunization: Secondary | ICD-10-CM | POA: Diagnosis not present

## 2016-02-15 DIAGNOSIS — Z3483 Encounter for supervision of other normal pregnancy, third trimester: Secondary | ICD-10-CM

## 2016-02-15 DIAGNOSIS — O24410 Gestational diabetes mellitus in pregnancy, diet controlled: Secondary | ICD-10-CM

## 2016-02-15 DIAGNOSIS — O09523 Supervision of elderly multigravida, third trimester: Secondary | ICD-10-CM

## 2016-02-15 DIAGNOSIS — O0993 Supervision of high risk pregnancy, unspecified, third trimester: Secondary | ICD-10-CM

## 2016-02-15 LAB — POCT URINALYSIS DIP (DEVICE)
BILIRUBIN URINE: NEGATIVE
Glucose, UA: NEGATIVE mg/dL
Hgb urine dipstick: NEGATIVE
Ketones, ur: NEGATIVE mg/dL
LEUKOCYTES UA: NEGATIVE
NITRITE: NEGATIVE
PH: 7 (ref 5.0–8.0)
Protein, ur: NEGATIVE mg/dL
Specific Gravity, Urine: 1.01 (ref 1.005–1.030)
UROBILINOGEN UA: 0.2 mg/dL (ref 0.0–1.0)

## 2016-02-15 MED ORDER — TETANUS-DIPHTH-ACELL PERTUSSIS 5-2.5-18.5 LF-MCG/0.5 IM SUSP
0.5000 mL | Freq: Once | INTRAMUSCULAR | Status: AC
  Administered 2016-02-15: 0.5 mL via INTRAMUSCULAR

## 2016-02-15 MED ORDER — ACCU-CHEK AVIVA PLUS W/DEVICE KIT: 1.0000 | PACK | Freq: Once | Status: DC

## 2016-02-15 NOTE — Progress Notes (Signed)
Pt c/o dizziness which interferes with her activities Burning w/ urination

## 2016-02-15 NOTE — Progress Notes (Signed)
Subjective:  Sylvia Best is a 35 y.o. [id number] at 3w6dbeing seen today for ongoing prenatal care.  She is currently monitored for the following issues for this low-risk pregnancy and has Supervision of normal pregnancy, antepartum; Previous cesarean delivery affecting pregnancy, antepartum; History of gestational diabetes in prior pregnancy, currently pregnant; AMA (advanced maternal age) multigravida 357+ Umbilical pain; Language barrier, cultural differences; Orthostatic dizziness; and Velamentous insertion of umbilical cord, antepartum on her problem list.  Patient reports dizziness. The patient states that she has dizziness daily for the last few days. She has been given Meclinzine in the past, but does not like the way it makes her feel and does not want to take it again.   Contractions: Irregular. Vag. Bleeding: None.  Movement: Present. Denies leaking of fluid.   The following portions of the patient's history were reviewed and updated as appropriate: allergies, current medications, past family history, past medical history, past social history, past surgical history and problem list. Problem list updated.  Objective:   Filed Vitals:   02/15/16 1634  BP: 108/70  Pulse: 92  Weight: 150 lb 8 oz (68.266 kg)    Fetal Status: Fetal Heart Rate (bpm): 161 Fundal Height: 31 cm Movement: Present     General:  Alert, oriented and cooperative. Patient is in no acute distress.  Skin: Skin is warm and dry. No rash noted.   Cardiovascular: Normal heart rate noted  Respiratory: Normal respiratory effort, no problems with respiration noted  Abdomen: Soft, gravid, appropriate for gestational age. Pain/Pressure: Present     Pelvic: Vag. Bleeding: None Vag D/C Character: White   Cervical exam deferred        Extremities: Normal range of motion.  Edema: None  Mental Status: Normal mood and affect. Normal behavior. Normal judgment and thought content.   Urinalysis: Urine Protein: Negative Urine  Glucose: Negative  Assessment and Plan:  Pregnancy: [id number] 345w6d1. Diet controlled gestational diabetes mellitus in third trimester - Blood Glucose Monitoring Suppl (ACCU-CHEK AVIVA PLUS) w/Device KIT; 1 Device by Does not apply route once.  Dispense: 1 kit; Refill: 0 - Patient will be scheduled to see Diabetes Education on Monday for counseling - Discussed basic dietary changes that can be started immediately while waiting for Diabetes Education  2. Supervision of high risk pregnancy in third trimester - Tdap (BOOSTRIX) injection 0.5 mL; Inject 0.5 mLs into the muscle once.   Preterm labor symptoms and general obstetric precautions including but not limited to vaginal bleeding, contractions, leaking of fluid and fetal movement were reviewed in detail with the patient. Please refer to After Visit Summary for other counseling recommendations.  Return in about 2 weeks (around 02/29/2016) for [facility].   JuLuvenia ReddenPA-C

## 2016-02-15 NOTE — Patient Instructions (Signed)
Gestational Diabetes Mellitus  Gestational diabetes mellitus, often simply referred to as gestational diabetes, is a type of diabetes that some women develop during pregnancy. In gestational diabetes, the pancreas does not make enough insulin (a hormone), the cells are less responsive to the insulin that is made (insulin resistance), or both. Normally, insulin moves sugars from food into the tissue cells. The tissue cells use the sugars for energy. The lack of insulin or the lack of normal response to insulin causes excess sugars to build up in the blood instead of going into the tissue cells. As a result, high blood sugar (hyperglycemia) develops. The effect of high sugar (glucose) levels can cause many problems.   RISK FACTORS  You have an increased chance of developing gestational diabetes if you have a family history of diabetes and also have one or more of the following risk factors:  · A body mass index over 30 (obesity).  · A previous pregnancy with gestational diabetes.  · An older age at the time of pregnancy.  If blood glucose levels are kept in the normal range during pregnancy, women can have a healthy pregnancy. If your blood glucose levels are not well controlled, there may be risks to you, your unborn baby (fetus), your labor and delivery, or your newborn baby.   SYMPTOMS   If symptoms are experienced, they are much like symptoms you would normally expect during pregnancy. The symptoms of gestational diabetes include:   · Increased thirst (polydipsia).  · Increased urination (polyuria).  · Increased urination during the night (nocturia).  · Weight loss. This weight loss may be rapid.  · Frequent, recurring infections.  · Tiredness (fatigue).  · Weakness.  · Vision changes, such as blurred vision.  · Fruity smell to your breath.  · Abdominal pain.  DIAGNOSIS  Diabetes is diagnosed when blood glucose levels are increased. Your blood glucose level may be checked by one or more of the following blood  tests:  · A fasting blood glucose test. You will not be allowed to eat for at least 8 hours before a blood sample is taken.  · A random blood glucose test. Your blood glucose is checked at any time of the day regardless of when you ate.  · An oral glucose tolerance test (OGTT). Your blood glucose is measured after you have not eaten (fasted) for 1-3 hours and then after you drink a glucose-containing beverage. Since the hormones that cause insulin resistance are highest at about 24-28 weeks of a pregnancy, an OGTT is usually performed during that time. If you have risk factors, you may be screened for undiagnosed type 2 diabetes at your first prenatal visit.  TREATMENT   Gestational diabetes should be managed first with diet and exercise. Medicines may be added only if they are needed.  · You will need to take diabetes medicine or insulin daily to keep blood glucose levels in the desired range.  · You will need to match insulin dosing with exercise and healthy food choices.  If you have gestational diabetes, your treatment goal is to maintain the following blood glucose levels:  · Before meals (preprandial): at or below 95 mg/dL.  · After meals (postprandial):    One hour after a meal: at or below 140 mg/dL.    Two hours after a meal: at or below 120 mg/dL.  If you have pre-existing type 1 or type 2 diabetes, your treatment goal is to maintain the following blood glucose levels:  · Before   meals, at bedtime, and overnight: 60-99 mg/dL.  · After meals: peak of 100-129 mg/dL.  HOME CARE INSTRUCTIONS   · Have your hemoglobin A1c level checked twice a year.  · Perform daily blood glucose monitoring as directed by your health care provider. It is common to perform frequent blood glucose monitoring.  · Monitor urine ketones when you are ill and as directed by your health care provider.  · Take your diabetes medicine and insulin as directed by your health care provider to maintain your blood glucose level in the desired  range.  ¨ Never run out of diabetes medicine or insulin. It is needed every day.  ¨ Adjust insulin based on your intake of carbohydrates. Carbohydrates can raise blood glucose levels but need to be included in your diet. Carbohydrates provide vitamins, minerals, and fiber which are an essential part of a healthy diet. Carbohydrates are found in fruits, vegetables, whole grains, dairy products, legumes, and foods containing added sugars.  · Eat healthy foods. Alternate 3 meals with 3 snacks.  · Maintain a healthy weight gain. The usual total expected weight gain varies according to your prepregnancy body mass index (BMI).  · Carry a medical alert card or wear your medical alert jewelry.  · Carry a 15-gram carbohydrate snack with you at all times to treat low blood glucose (hypoglycemia). Some examples of 15-gram carbohydrate snacks include:  ¨ Glucose tablets, 3 or 4.  ¨ Glucose gel, 15-gram tube.  ¨ Raisins, 2 tablespoons (24 g).  ¨ Jelly beans, 6.  ¨ Animal crackers, 8.  ¨ Fruit juice, regular soda, or low-fat milk, 4 ounces (120 mL).  ¨ Gummy treats, 9.  · Recognize hypoglycemia. Hypoglycemia during pregnancy occurs with blood glucose levels of 60 mg/dL and below. The risk for hypoglycemia increases when fasting or skipping meals, during or after intense exercise, and during sleep. Hypoglycemia symptoms can include:  ¨ Tremors or shakes.  ¨ Decreased ability to concentrate.  ¨ Sweating.  ¨ Increased heart rate.  ¨ Headache.  ¨ Dry mouth.  ¨ Hunger.  ¨ Irritability.  ¨ Anxiety.  ¨ Restless sleep.  ¨ Altered speech or coordination.  ¨ Confusion.  · Treat hypoglycemia promptly. If you are alert and able to safely swallow, follow the 15:15 rule:  ¨ Take 15-20 grams of rapid-acting glucose or carbohydrate. Rapid-acting options include glucose gel, glucose tablets, or 4 ounces (120 mL) of fruit juice, regular soda, or low-fat milk.  ¨ Check your blood glucose level 15 minutes after taking the glucose.  ¨ Take 15-20  grams more of glucose if the repeat blood glucose level is still 70 mg/dL or below.  ¨ Eat a meal or snack within 1 hour once blood glucose levels return to normal.  · Be alert to polyuria (excess urination) and polydipsia (excess thirst) which are early signs of hyperglycemia. An early awareness of hyperglycemia allows for prompt treatment. Treat hyperglycemia as directed by your health care provider.  · Engage in at least 30 minutes of physical activity a day or as directed by your health care provider. Ten minutes of physical activity timed 30 minutes after each meal is encouraged to control postprandial blood glucose levels.  · Adjust your insulin dosing and food intake as needed if you start a new exercise or sport.  · Follow your sick-day plan at any time you are unable to eat or drink as usual.  · Avoid tobacco and alcohol use.  · Keep all follow-up visits as directed   by your health care provider.  · Follow the advice of your health care provider regarding your prenatal and post-delivery (postpartum) appointments, meal planning, exercise, medicines, vitamins, blood tests, other medical tests, and physical activities.  · Perform daily skin and foot care. Examine your skin and feet daily for cuts, bruises, redness, nail problems, bleeding, blisters, or sores.  · Brush your teeth and gums at least twice a day and floss at least once a day. Follow up with your dentist regularly.  · Schedule an eye exam during the first trimester of your pregnancy or as directed by your health care provider.  · Share your diabetes management plan with your workplace or school.  · Stay up-to-date with immunizations.  · Learn to manage stress.  · Obtain ongoing diabetes education and support as needed.  · Learn about and consider breastfeeding your baby.  · You should have your blood sugar level checked 6-12 weeks after delivery. This is done with an oral glucose tolerance test (OGTT).  SEEK MEDICAL CARE IF:   · You are unable to  eat food or drink fluids for more than 6 hours.  · You have nausea and vomiting for more than 6 hours.  · You have a blood glucose level of 200 mg/dL and you have ketones in your urine.  · There is a change in mental status.  · You develop vision problems.  · You have a persistent headache.  · You have upper abdominal pain or discomfort.  · You develop an additional serious illness.  · You have diarrhea for more than 6 hours.  · You have been sick or have had a fever for a couple of days and are not getting better.  SEEK IMMEDIATE MEDICAL CARE IF:   · You have difficulty breathing.  · You no longer feel the baby moving.  · You are bleeding or have discharge from your vagina.  · You start having premature contractions or labor.  MAKE SURE YOU:  · Understand these instructions.  · Will watch your condition.  · Will get help right away if you are not doing well or get worse.     This information is not intended to replace advice given to you by your health care provider. Make sure you discuss any questions you have with your health care provider.     Document Released: 12/09/2000 Document Revised: 09/23/2014 Document Reviewed: 03/31/2012  Elsevier Interactive Patient Education ©2016 [employer]

## 2016-02-16 ENCOUNTER — Encounter (HOSPITAL_COMMUNITY): Payer: Self-pay

## 2016-02-16 ENCOUNTER — Inpatient Hospital Stay (HOSPITAL_COMMUNITY)
Admission: AD | Admit: 2016-02-16 | Discharge: 2016-02-16 | Disposition: A | Discharge status: Home / Self Care | Payer: Medicaid Other | Source: Ambulatory Visit | Attending: Obstetrics and Gynecology | Admitting: Obstetrics and Gynecology

## 2016-02-16 DIAGNOSIS — O34219 Maternal care for unspecified type scar from previous cesarean delivery: Secondary | ICD-10-CM

## 2016-02-16 DIAGNOSIS — O43122 Velamentous insertion of umbilical cord, second trimester: Secondary | ICD-10-CM

## 2016-02-16 DIAGNOSIS — Z8632 Personal history of gestational diabetes: Secondary | ICD-10-CM

## 2016-02-16 DIAGNOSIS — R55 Syncope and collapse: Secondary | ICD-10-CM | POA: Diagnosis present

## 2016-02-16 DIAGNOSIS — O09522 Supervision of elderly multigravida, second trimester: Secondary | ICD-10-CM

## 2016-02-16 DIAGNOSIS — O26893 Other specified pregnancy related conditions, third trimester: Secondary | ICD-10-CM | POA: Diagnosis not present

## 2016-02-16 DIAGNOSIS — O09291 Supervision of pregnancy with other poor reproductive or obstetric history, first trimester: Secondary | ICD-10-CM

## 2016-02-16 DIAGNOSIS — Z3A31 31 weeks gestation of pregnancy: Secondary | ICD-10-CM | POA: Insufficient documentation

## 2016-02-16 DIAGNOSIS — Z3483 Encounter for supervision of other normal pregnancy, third trimester: Secondary | ICD-10-CM

## 2016-02-16 LAB — URINALYSIS, ROUTINE W REFLEX MICROSCOPIC
BILIRUBIN URINE: NEGATIVE
Glucose, UA: NEGATIVE mg/dL
HGB URINE DIPSTICK: NEGATIVE
Ketones, ur: NEGATIVE mg/dL
LEUKOCYTES UA: NEGATIVE
Nitrite: NEGATIVE
PROTEIN: NEGATIVE mg/dL
Specific Gravity, Urine: <1.005 — ABNORMAL LOW (ref 1.005–1.030)
pH: 6.5 (ref 5.0–8.0)

## 2016-02-16 LAB — GLUCOSE, CAPILLARY: Glucose-Capillary: 144 mg/dL — ABNORMAL HIGH (ref 65–99)

## 2016-02-16 MED ORDER — LACTATED RINGERS IV BOLUS (SEPSIS)
1000.0000 mL | Freq: Once | INTRAVENOUS | Status: AC
  Administered 2016-02-16: 1000 mL via INTRAVENOUS

## 2016-02-16 NOTE — MAU Provider Note (Signed)
History   Pt speaks arabic from [place].  CSN: [id number]  Arrival date and time: 02/16/16 1109   None     Chief Complaint  Patient presents with  . Fatigue   HPI Comments: Sylvia Best is a 35 y.o. [id number] at [redacted]w[redacted]d prenatal risk factors (h/o c-s, h/o GDM, AMA, multigravida, language barrier, culture differences) who presents to MAU for pre-syncopal episode while completing her AM grocery shopping, brought by EMS.  Pt states that for the past week has had progressive dizziness, generalized weakness and poor appetite.  Pt states that she found out on Monday 02/12/16 her father was admitted to hospital for leg amputation 2/2 complicated DM2.  Since find this information out she has had difficulty eating or drinking.  Today she did not have breakfast and left home to run daily errands and began to have dizziness and fainting like sensation around 11:30AM at the grocery store.  On arrival to MAU has felt improvement after receiving 1 L bolus and snack.  Initial FSBS 144 and BP 106/66 mmHg, afebrile, NSR.  Fetal Movements present.  Denies any LOF, VB, painful coitus, urinary symptoms, ctx, HA, CP, SOB, RUQ pain, BLE swelling.   OB History    Gravida Para Term Preterm AB TAB SAB Ectopic Multiple Living   '4 1 1 ' 0 2 0 2 0 1 2      Past Medical History  Diagnosis Date  . GERD (gastroesophageal reflux disease)     no meds  . Diabetes mellitus     gestational  . Gestational diabetes   . Asthma     Past Surgical History  Procedure Laterality Date  . Tonsillectomy      age 35 . Cesarean section  04/26/2011    Procedure: CESAREAN SECTION;  Surgeon: TDonnamae Jude MD;  [facility];  Service: Gynecology;  Laterality: N/A;  . Dilation and curettage of uterus      Family History  Problem Relation Age of Onset  . Hypertension Mother   . Heart disease Father   . Diabetes Father   . Diabetes Paternal Grandfather     Social History  Substance Use Topics  . Smoking status: Never  Smoker   . Smokeless tobacco: Never Used  . Alcohol Use: No    Allergies: No Known Allergies  Prescriptions prior to admission  Medication Sig Dispense Refill Last Dose  . acetaminophen (TYLENOL) 500 MG tablet Take 500 mg by mouth every 6 (six) hours as needed for mild pain. Reported on 11/14/2015   Taking  . Blood Glucose Monitoring Suppl (ACCU-CHEK AVIVA PLUS) w/Device KIT 1 Device by Does not apply route once. 1 kit 0   . famotidine (PEPCID) 20 MG tablet Take 1 tablet (20 mg total) by mouth 2 (two) times daily. 60 tablet 3 Taking  . ferrous sulfate (FERROUSUL) 325 (65 FE) MG tablet Take 1 tablet (325 mg total) by mouth once. 60 tablet 1 Taking  . meclizine (ANTIVERT) 25 MG tablet Take 1 tablet (25 mg total) by mouth 3 (three) times daily as needed for dizziness. 30 tablet 2 Taking  . Prenatal Vit-Fe Fumarate-FA (NAT-RUL PRENATAL VITAMINS) 28-0.8 MG TABS Take 1 tablet by mouth daily. 30 tablet 12 Taking    Review of Systems  Constitutional: Negative.   Eyes: Positive for blurred vision. Negative for photophobia.  Respiratory: Negative.   Cardiovascular: Negative for chest pain, palpitations and leg swelling.  Gastrointestinal: Negative.   Genitourinary: Negative.   Skin: Negative.  Neurological: Positive for dizziness and headaches. Negative for loss of consciousness.   Physical Exam   Blood pressure 107/66, pulse 95, temperature 98.3 F (36.8 C), temperature source Oral, resp. rate 16, last menstrual period 07/07/2015, SpO2 100 %.  Physical Exam  Constitutional: She is oriented to person, place, and time. She appears well-developed and well-nourished.  HENT:  Head: Normocephalic and atraumatic.  Eyes: Conjunctivae are normal. Pupils are equal, round, and reactive to light.  Neck: Normal range of motion. Neck supple.  Cardiovascular: Normal rate and regular rhythm.   Respiratory: Effort normal and breath sounds normal.  GI:  Gravida  Genitourinary: No vaginal discharge  found.  0/soft/-3  Musculoskeletal: Normal range of motion.  Neurological: She is alert and oriented to person, place, and time. She has normal reflexes.  Skin: Skin is warm and dry.  Psychiatric:  Anxious    MAU Course  Procedures  1L bolus + PO challenge, pt had good response VS remained stable with no hypotensive episodes FSBS >100 D/c to home with Ob f/u in 3 days time as scheduled A/P discussed with Derrill Memo, CNM, please see attending note/addendum for changes in plan.  Assessment and Plan  #Vasovagal response to stress -If VS remain stable and no further symptoms after PO challenge, prep for d/c -UA negative  -Discussed labor precautions, having regular scheduled snacks every 3-4 hours, avoiding prolonged periods of fasting, resting when tired -F/u with OB PCP on Monday as scheduled   Melany Guernsey 02/16/2016, 12:02 PM   I have participated in the care of this patient and I agree with the above. FHR reactive; rare mild ctx. Serita Grammes CNM 6:36 PM 02/16/2016

## 2016-02-16 NOTE — MAU Note (Addendum)
Pt was at [employer]. Felt faint, lightheaded.  Patient states loss of vision/only seeing black. Given OJ on site.  Transported to [facility] via ambulance.  Continued malaise, light headed, and dizzy. Pt states she has not eaten yet today except for OJ and is diabetic.

## 2016-02-16 NOTE — MAU Note (Signed)
Urine in lab 

## 2016-02-16 NOTE — Discharge Instructions (Signed)
Syncope, commonly known as fainting, is a temporary loss of consciousness. It occurs when the blood flow to the brain is reduced. Vasovagal syncope (also called neurocardiogenic syncope) is a fainting spell in which the blood flow to the brain is reduced because of a sudden drop in heart rate and blood pressure. Vasovagal syncope occurs when the brain and the cardiovascular system (blood vessels) do not adequately communicate and respond to each other. This is the most common cause of fainting. It often occurs in response to fear or some other type of emotional or physical stress. The body has a reaction in which the heart starts beating too slowly or the blood vessels expand, reducing blood pressure. This type of fainting spell is generally considered harmless. However, injuries can occur if a person takes a sudden fall during a fainting spell.   CAUSES   Vasovagal syncope occurs when a person's blood pressure and heart rate decrease suddenly, usually in response to a trigger. Many things and situations can trigger an episode. Some of these include:    Pain.    Fear.    The sight of blood or medical procedures, such as blood being drawn from a vein.    Common activities, such as coughing, swallowing, stretching, or going to the bathroom.    Emotional stress.    Prolonged standing, especially in a warm environment.    Lack of sleep or rest.    Prolonged lack of food.    Prolonged lack of fluids.    Recent illness.   The use of certain drugs that affect blood pressure, such as cocaine, alcohol, marijuana, inhalants, and opiates.   SYMPTOMS   Before the fainting episode, you may:    Feel dizzy or light headed.    Become pale.   Sense that you are going to faint.    Feel like the room is spinning.    Have tunnel vision, only seeing directly in front of you.    Feel sick to your stomach (nauseous).    See spots or slowly lose vision.    Hear ringing in your ears.    Have a headache.     Feel warm and sweaty.    Feel a sensation of pins and needles.  During the fainting spell, you will generally be unconscious for no longer than a couple minutes before waking up and returning to normal. If you get up too quickly before your body can recover, you may faint again. Some twitching or jerky movements may occur during the fainting spell.   DIAGNOSIS   Your health care provider will ask about your symptoms, take a medical history, and perform a physical exam. Various tests may be done to rule out other causes of fainting. These may include blood tests and tests to check the heart, such as electrocardiography, echocardiography, and possibly an electrophysiology study. When other causes have been ruled out, a test may be done to check the body's response to changes in position (tilt table test).  TREATMENT   Most cases of vasovagal syncope do not require treatment. Your health care provider may recommend ways to avoid fainting triggers and may provide home strategies for preventing fainting. If you must be exposed to a possible trigger, you can drink additional fluids to help reduce your chances of having an episode of vasovagal syncope. If you have warning signs of an oncoming episode, you can respond by positioning yourself favorably (lying down).  If your fainting spells continue, you may be   given medicines to prevent fainting. Some medicines may help make you more resistant to repeated episodes of vasovagal syncope. Special exercises or compression stockings may be recommended. In rare cases, the surgical placement of a pacemaker is considered.  HOME CARE INSTRUCTIONS    Learn to identify the warning signs of vasovagal syncope.    Sit or lie down at the first warning sign of a fainting spell. If sitting, put your head down between your legs. If you lie down, swing your legs up in the air to increase blood flow to the brain.    Avoid hot tubs and saunas.   Avoid prolonged standing.   Drink  enough fluids to keep your urine clear or pale yellow. Avoid caffeine.   Increase salt in your diet as directed by your health care provider.    If you have to stand for a long time, perform movements such as:     Crossing your legs.     Flexing and stretching your leg muscles.     Squatting.     Moving your legs.     Bending over.    Only take over-the-counter or prescription medicines as directed by your health care provider. Do not suddenly stop any medicines without asking your health care provider first.  SEEK MEDICAL CARE IF:    Your fainting spells continue or happen more frequently in spite of treatment.    You lose consciousness for more than a couple minutes.   You have fainting spells during or after exercising or after being startled.    You have new symptoms that occur with the fainting spells, such as:     Shortness of breath.    Chest pain.     Irregular heartbeat.    You have episodes of twitching or jerky movements that last longer than a few seconds.   You have episodes of twitching or jerky movements without obvious fainting.  SEEK IMMEDIATE MEDICAL CARE IF:    You have injuries or bleeding after a fainting spell.    You have episodes of twitching or jerky movements that last longer than 5 minutes.    You have more than one spell of twitching or jerky movements before returning to consciousness after fainting.     This information is not intended to replace advice given to you by your health care provider. Make sure you discuss any questions you have with your health care provider.     Document Released: 08/19/2012 Document Revised: 01/17/2015 Document Reviewed: 08/19/2012  Elsevier Interactive Patient Education 2016 Elsevier Inc.

## 2016-02-19 ENCOUNTER — Encounter: Payer: Medicaid Other | Attending: Obstetrics & Gynecology | Admitting: Dietician

## 2016-02-19 ENCOUNTER — Ambulatory Visit (INDEPENDENT_AMBULATORY_CARE_PROVIDER_SITE_OTHER): Payer: Medicaid Other | Admitting: Family Medicine

## 2016-02-19 ENCOUNTER — Encounter: Payer: Medicaid Other | Admitting: Family Medicine

## 2016-02-19 VITALS — BP 102/63 | HR 79

## 2016-02-19 DIAGNOSIS — Z3483 Encounter for supervision of other normal pregnancy, third trimester: Secondary | ICD-10-CM

## 2016-02-19 DIAGNOSIS — Z029 Encounter for administrative examinations, unspecified: Secondary | ICD-10-CM | POA: Insufficient documentation

## 2016-02-19 DIAGNOSIS — O24410 Gestational diabetes mellitus in pregnancy, diet controlled: Secondary | ICD-10-CM

## 2016-02-19 LAB — POCT URINALYSIS DIP (DEVICE)
Bilirubin Urine: NEGATIVE
Glucose, UA: 100 mg/dL — AB
Hgb urine dipstick: NEGATIVE
Ketones, ur: NEGATIVE mg/dL
Leukocytes, UA: NEGATIVE
NITRITE: NEGATIVE
PROTEIN: NEGATIVE mg/dL
SPECIFIC GRAVITY, URINE: 1.015 (ref 1.005–1.030)
UROBILINOGEN UA: 0.2 mg/dL (ref 0.0–1.0)
pH: 6.5 (ref 5.0–8.0)

## 2016-02-19 NOTE — Progress Notes (Signed)
Nutrition note: GDM diet education  Pt was recently diagnosed with diabetes (pt had it with a previous pregnancy).  Pt reports eating 2 meals & 2 snacks/d (usually misses breakfast). Pt is taking a PNV. Pt reports no N&V or heartburn. NKFA. Pt reports no walking or physical activity.  Pt received verbal & written education on GDM diet in Arabic via video interpreter. Encouraged pt to eat breakfast daily. Pt agrees to follow GDM diet with 3 meals & 1-3 snacks/d with proper CHO/ protein combination. Pt has [place] & plans to BF. F/u in 2-4 wks Vladimir Faster, MS, RD, LDN, [facility]

## 2016-02-19 NOTE — Patient Instructions (Addendum)
Marland Kitchen         4  .           .     .        : 1.  (      )      90. 2. 2         120.    3   3  .   (    )   .            .     (     )      .       .      30          .   Following an appropriate diet and keeping your blood sugar under control is the most important thing to do for your health and that of your unborn baby.  Please check your blood sugar 4 times daily.  Please keep accurate BS logs and bring them with you to every visit.  Please bring your meter also.  Goals for Blood sugar should be: 1. Fasting (first thing in the morning before eating) should be less than 90.   2.  2 hours after meals should be less than 120.  Please eat 3 meals and 3 snacks.  Include protein (meat, dairy-cheese, eggs, nuts) with all meals.  Be mindful that carbohydrates increase your blood sugar.  Not just sweet food (cookies, cake, donuts, fruit, juice, soda) but also bread, pasta, rice, and potatoes.  You have to limit how many carbs you are eating.  Adding exercise, as little as 30 minutes a day can decrease your blood sugar.  Gestational Diabetes Mellitus Gestational diabetes mellitus, often simply referred to as gestational diabetes, is a type of diabetes that some women develop during pregnancy. In gestational diabetes, the pancreas does not make enough insulin (a  hormone), the cells are less responsive to the insulin that is made (insulin resistance), or both.Normally, insulin moves sugars from food into the tissue cells. The tissue cells use the sugars for energy. The lack of insulin or the lack of normal response to insulin causes excess sugars to build up in the blood instead of going into the tissue cells. As a result, high blood sugar (hyperglycemia) develops. The effect of high sugar (glucose) levels can cause many problems.  RISK FACTORS You have an increased chance of developing gestational diabetes if you have a family history of diabetes and also have one or more of the following risk factors:  A body mass index over 30 (obesity).  A previous pregnancy with gestational diabetes.  An older age at the time of pregnancy. If blood glucose levels are kept in the normal range during pregnancy, women can have a healthy pregnancy. If your blood glucose levels are not well controlled, there may be risks to you, your unborn baby (fetus), your labor and delivery, or your newborn baby.  SYMPTOMS  If symptoms are experienced, they are much like symptoms you would normally expect during pregnancy. The symptoms of gestational diabetes include:   Increased thirst (polydipsia).  Increased urination (polyuria).  Increased urination during the night (nocturia).  Weight loss. This weight loss may be rapid.  Frequent, recurring infections.  Tiredness (fatigue).  Weakness.  Vision changes, such as blurred vision.  Fruity smell to your breath.  Abdominal pain. DIAGNOSIS Diabetes is diagnosed when blood glucose levels are increased. Your blood glucose level may be checked by one or more of the following blood tests:  A fasting blood glucose test. You will not be allowed to eat for at least 8 hours before a blood sample is taken.  A random blood glucose test. Your blood glucose is checked at any time of the day regardless of when you ate.  An oral  glucose tolerance test (OGTT). Your blood glucose is measured after you have not eaten (fasted) for 1-3 hours and then after you drink a glucose-containing beverage. Since the hormones that cause insulin resistance are highest at about 24-28 weeks of a pregnancy, an OGTT is usually performed during that time. If you have risk factors, you may be screened for undiagnosed type 2 diabetes at your first prenatal visit. TREATMENT  Gestational diabetes should be managed first with diet and exercise. Medicines may be added only if they are needed.  You will need to take diabetes medicine or insulin daily to keep blood glucose levels in the desired range.  You will need to match insulin dosing with exercise and healthy food choices. If you have gestational diabetes, your treatment goal is to maintain the following blood glucose levels:  Before meals (preprandial): at or below 95 mg/dL.  After meals (postprandial):  One hour after a meal: at or below 140 mg/dL.  Two hours after a meal: at or below 120 mg/dL. If you have pre-existing type 1 or type 2 diabetes, your treatment goal is to maintain the following blood glucose levels:  Before meals, at bedtime, and overnight: 60-99 mg/dL.  After meals: peak of 100-129 mg/dL. HOME CARE INSTRUCTIONS   Have your hemoglobin A1c level checked twice a year.  Perform daily blood glucose monitoring as directed by your health care provider. It is common to perform frequent blood glucose monitoring.  Monitor urine ketones when you are ill and as directed by your health care provider.  Take your diabetes medicine and insulin as directed by your health care provider to maintain your blood glucose level in the desired range.  Never run out of diabetes medicine or insulin. It is needed every day.  Adjust insulin based on your intake of carbohydrates. Carbohydrates can raise blood glucose levels but need to be included in your diet. Carbohydrates provide  vitamins, minerals, and fiber which are an essential part of a healthy diet. Carbohydrates are found in fruits, vegetables, whole grains, dairy products, legumes, and foods containing added sugars.  Eat healthy foods. Alternate 3 meals with 3 snacks.  Maintain a healthy weight gain. The usual total expected weight gain varies according to your prepregnancy body mass index (BMI).  Carry a medical alert card or wear your medical alert jewelry.  Carry a 15-gram carbohydrate snack with you at all times to treat low blood glucose (hypoglycemia). Some examples of 15-gram carbohydrate snacks include:  Glucose tablets, 3 or 4.  Glucose gel, 15-gram tube.  Raisins, 2 tablespoons (24 g).  Jelly beans, 6.  Animal crackers, 8.  Fruit juice, regular soda, or low-fat milk, 4 ounces (120 mL).  Gummy treats, 9.  Recognize hypoglycemia. Hypoglycemia during pregnancy occurs with blood glucose levels of 60 mg/dL and below. The risk for hypoglycemia increases when fasting or skipping meals, during or after intense exercise, and during sleep. Hypoglycemia symptoms can include:  Tremors or shakes.  Decreased ability to concentrate.  Sweating.  Increased  heart rate.  Headache.  Dry mouth.  Hunger.  Irritability.  Anxiety.  Restless sleep.  Altered speech or coordination.  Confusion.  Treat hypoglycemia promptly. If you are alert and able to safely swallow, follow the 15:15 rule:  Take 15-20 grams of rapid-acting glucose or carbohydrate. Rapid-acting options include glucose gel, glucose tablets, or 4 ounces (120 mL) of fruit juice, regular soda, or low-fat milk.  Check your blood glucose level 15 minutes after taking the glucose.  Take 15-20 grams more of glucose if the repeat blood glucose level is still 70 mg/dL or below.  Eat a meal or snack within 1 hour once blood glucose levels return to normal.  Be alert to polyuria (excess urination) and polydipsia (excess thirst) which  are early signs of hyperglycemia. An early awareness of hyperglycemia allows for prompt treatment. Treat hyperglycemia as directed by your health care provider.  Engage in at least 30 minutes of physical activity a day or as directed by your health care provider. Ten minutes of physical activity timed 30 minutes after each meal is encouraged to control postprandial blood glucose levels.  Adjust your insulin dosing and food intake as needed if you start a new exercise or sport.  Follow your sick-day plan at any time you are unable to eat or drink as usual.  Avoid tobacco and alcohol use.  Keep all follow-up visits as directed by your health care provider.  Follow the advice of your health care provider regarding your prenatal and post-delivery (postpartum) appointments, meal planning, exercise, medicines, vitamins, blood tests, other medical tests, and physical activities.  Perform daily skin and foot care. Examine your skin and feet daily for cuts, bruises, redness, nail problems, bleeding, blisters, or sores.  Brush your teeth and gums at least twice a day and floss at least once a day. Follow up with your dentist regularly.  Schedule an eye exam during the first trimester of your pregnancy or as directed by your health care provider.  Share your diabetes management plan with your workplace or school.  Stay up-to-date with immunizations.  Learn to manage stress.  Obtain ongoing diabetes education and support as needed.  Learn about and consider breastfeeding your baby.  You should have your blood sugar level checked 6-12 weeks after delivery. This is done with an oral glucose tolerance test (OGTT). SEEK MEDICAL CARE IF:   You are unable to eat food or drink fluids for more than 6 hours.  You have nausea and vomiting for more than 6 hours.  You have a blood glucose level of 200 mg/dL and you have ketones in your urine.  There is a change in mental status.  You develop vision  problems.  You have a persistent headache.  You have upper abdominal pain or discomfort.  You develop an additional serious illness.  You have diarrhea for more than 6 hours.  You have been sick or have had a fever for a couple of days and are not getting better. SEEK IMMEDIATE MEDICAL CARE IF:   You have difficulty breathing.  You no longer feel the baby moving.  You are bleeding or have discharge from your vagina.  You start having premature contractions or labor. MAKE SURE YOU:  Understand these instructions.  Will watch your condition.  Will get help right away if you are not doing well or get worse.   This information is not intended to replace advice given to you by your health care provider. Make sure you discuss any  questions you have with your health care provider.   Document Released: 12/09/2000 Document Revised: 09/23/2014 Document Reviewed: 03/31/2012 Elsevier Interactive Patient Education [employer].  Breastfeeding Deciding to breastfeed is one of the best choices you can make for you and your baby. A change in hormones during pregnancy causes your breast tissue to grow and increases the number and size of your milk ducts. These hormones also allow proteins, sugars, and fats from your blood supply to make breast milk in your milk-producing glands. Hormones prevent breast milk from being released before your baby is born as well as prompt milk flow after birth. Once breastfeeding has begun, thoughts of your baby, as well as his or her sucking or crying, can stimulate the release of milk from your milk-producing glands.  BENEFITS OF BREASTFEEDING For Your Baby  Your first milk (colostrum) helps your baby's digestive system function better.  There are antibodies in your milk that help your baby fight off infections.  Your baby has a lower incidence of asthma, allergies, and sudden infant death syndrome.  The nutrients in breast milk are better for your  baby than infant formulas and are designed uniquely for your baby's needs.  Breast milk improves your baby's brain development.  Your baby is less likely to develop other conditions, such as childhood obesity, asthma, or type 2 diabetes mellitus. For You  Breastfeeding helps to create a very special bond between you and your baby.  Breastfeeding is convenient. Breast milk is always available at the correct temperature and costs nothing.  Breastfeeding helps to burn calories and helps you lose the weight gained during pregnancy.  Breastfeeding makes your uterus contract to its prepregnancy size faster and slows bleeding (lochia) after you give birth.   Breastfeeding helps to lower your risk of developing type 2 diabetes mellitus, osteoporosis, and breast or ovarian cancer later in life. SIGNS THAT YOUR BABY IS HUNGRY Early Signs of Hunger  Increased alertness or activity.  Stretching.  Movement of the head from side to side.  Movement of the head and opening of the mouth when the corner of the mouth or cheek is stroked (rooting).  Increased sucking sounds, smacking lips, cooing, sighing, or squeaking.  Hand-to-mouth movements.  Increased sucking of fingers or hands. Late Signs of Hunger  Fussing.  Intermittent crying. Extreme Signs of Hunger Signs of extreme hunger will require calming and consoling before your baby will be able to breastfeed successfully. Do not wait for the following signs of extreme hunger to occur before you initiate breastfeeding:  Restlessness.  A loud, strong cry.  Screaming. BREASTFEEDING BASICS Breastfeeding Initiation  Find a comfortable place to sit or lie down, with your neck and back well supported.  Place a pillow or rolled up blanket under your baby to bring him or her to the level of your breast (if you are seated). Nursing pillows are specially designed to help support your arms and your baby while you breastfeed.  Make sure that  your baby's abdomen is facing your abdomen.  Gently massage your breast. With your fingertips, massage from your chest wall toward your nipple in a circular motion. This encourages milk flow. You may need to continue this action during the feeding if your milk flows slowly.  Support your breast with 4 fingers underneath and your thumb above your nipple. Make sure your fingers are well away from your nipple and your baby's mouth.  Stroke your baby's lips gently with your finger or nipple.  When  your baby's mouth is open wide enough, quickly bring your baby to your breast, placing your entire nipple and as much of the colored area around your nipple (areola) as possible into your baby's mouth.  More areola should be visible above your baby's upper lip than below the lower lip.  Your baby's tongue should be between his or her lower gum and your breast.  Ensure that your baby's mouth is correctly positioned around your nipple (latched). Your baby's lips should create a seal on your breast and be turned out (everted).  It is common for your baby to suck about 2-3 minutes in order to start the flow of breast milk. Latching Teaching your baby how to latch on to your breast properly is very important. An improper latch can cause nipple pain and decreased milk supply for you and poor weight gain in your baby. Also, if your baby is not latched onto your nipple properly, he or she may swallow some air during feeding. This can make your baby fussy. Burping your baby when you switch breasts during the feeding can help to get rid of the air. However, teaching your baby to latch on properly is still the best way to prevent fussiness from swallowing air while breastfeeding. Signs that your baby has successfully latched on to your nipple:  Silent tugging or silent sucking, without causing you pain.  Swallowing heard between every 3-4 sucks.  Muscle movement above and in front of his or her ears while  sucking. Signs that your baby has not successfully latched on to nipple:  Sucking sounds or smacking sounds from your baby while breastfeeding.  Nipple pain. If you think your baby has not latched on correctly, slip your finger into the corner of your baby's mouth to break the suction and place it between your baby's gums. Attempt breastfeeding initiation again. Signs of Successful Breastfeeding Signs from your baby:  A gradual decrease in the number of sucks or complete cessation of sucking.  Falling asleep.  Relaxation of his or her body.  Retention of a small amount of milk in his or her mouth.  Letting go of your breast by himself or herself. Signs from you:  Breasts that have increased in firmness, weight, and size 1-3 hours after feeding.  Breasts that are softer immediately after breastfeeding.  Increased milk volume, as well as a change in milk consistency and color by the fifth day of breastfeeding.  Nipples that are not sore, cracked, or bleeding. Signs That Your Randel Books is Getting Enough Milk  Wetting at least 3 diapers in a 24-hour period. The urine should be clear and pale yellow by age 534 days.  At least 3 stools in a 24-hour period by age 534 days. The stool should be soft and yellow.  At least 3 stools in a 24-hour period by age 53 days. The stool should be seedy and yellow.  No loss of weight greater than 10% of birth weight during the first 26 days of age.  Average weight gain of 4-7 ounces (113-198 g) per week after age 23 days.  Consistent daily weight gain by age 33 days, without weight loss after the age of 2 weeks. After a feeding, your baby may spit up a small amount. This is common. BREASTFEEDING FREQUENCY AND DURATION Frequent feeding will help you make more milk and can prevent sore nipples and breast engorgement. Breastfeed when you feel the need to reduce the fullness of your breasts or when your baby shows  signs of hunger. This is called "breastfeeding  on demand." Avoid introducing a pacifier to your baby while you are working to establish breastfeeding (the first 4-6 weeks after your baby is born). After this time you may choose to use a pacifier. Research has shown that pacifier use during the first year of a baby's life decreases the risk of sudden infant death syndrome (SIDS). Allow your baby to feed on each breast as long as he or she wants. Breastfeed until your baby is finished feeding. When your baby unlatches or falls asleep while feeding from the first breast, offer the second breast. Because newborns are often sleepy in the first few weeks of life, you may need to awaken your baby to get him or her to feed. Breastfeeding times will vary from baby to baby. However, the following rules can serve as a guide to help you ensure that your baby is properly fed:  Newborns (babies 34 weeks of age or younger) may breastfeed every 1-3 hours.  Newborns should not go longer than 3 hours during the day or 5 hours during the night without breastfeeding.  You should breastfeed your baby a minimum of 8 times in a 24-hour period until you begin to introduce solid foods to your baby at around 33 months of age. BREAST MILK PUMPING Pumping and storing breast milk allows you to ensure that your baby is exclusively fed your breast milk, even at times when you are unable to breastfeed. This is especially important if you are going back to work while you are still breastfeeding or when you are not able to be present during feedings. Your lactation consultant can give you guidelines on how long it is safe to store breast milk. A breast pump is a machine that allows you to pump milk from your breast into a sterile bottle. The pumped breast milk can then be stored in a refrigerator or freezer. Some breast pumps are operated by hand, while others use electricity. Ask your lactation consultant which type will work best for you. Breast pumps can be purchased, but some  hospitals and breastfeeding support groups lease breast pumps on a monthly basis. A lactation consultant can teach you how to hand express breast milk, if you prefer not to use a pump. CARING FOR YOUR BREASTS WHILE YOU BREASTFEED Nipples can become dry, cracked, and sore while breastfeeding. The following recommendations can help keep your breasts moisturized and healthy:  Avoid using soap on your nipples.  Wear a supportive bra. Although not required, special nursing bras and tank tops are designed to allow access to your breasts for breastfeeding without taking off your entire bra or top. Avoid wearing underwire-style bras or extremely tight bras.  Air dry your nipples for 3-45minutes after each feeding.  Use only cotton bra pads to absorb leaked breast milk. Leaking of breast milk between feedings is normal.  Use lanolin on your nipples after breastfeeding. Lanolin helps to maintain your skin's normal moisture barrier. If you use pure lanolin, you do not need to wash it off before feeding your baby again. Pure lanolin is not toxic to your baby. You may also hand express a few drops of breast milk and gently massage that milk into your nipples and allow the milk to air dry. In the first few weeks after giving birth, some women experience extremely full breasts (engorgement). Engorgement can make your breasts feel heavy, warm, and tender to the touch. Engorgement peaks within 3-5 days after you give birth.  The following recommendations can help ease engorgement:  Completely empty your breasts while breastfeeding or pumping. You may want to start by applying warm, moist heat (in the shower or with warm water-soaked hand towels) just before feeding or pumping. This increases circulation and helps the milk flow. If your baby does not completely empty your breasts while breastfeeding, pump any extra milk after he or she is finished.  Wear a snug bra (nursing or regular) or tank top for 1-2 days to  signal your body to slightly decrease milk production.  Apply ice packs to your breasts, unless this is too uncomfortable for you.  Make sure that your baby is latched on and positioned properly while breastfeeding. If engorgement persists after 48 hours of following these recommendations, contact your health care provider or a Science writer. OVERALL HEALTH CARE RECOMMENDATIONS WHILE BREASTFEEDING  Eat healthy foods. Alternate between meals and snacks, eating 3 of each per day. Because what you eat affects your breast milk, some of the foods may make your baby more irritable than usual. Avoid eating these foods if you are sure that they are negatively affecting your baby.  Drink milk, fruit juice, and water to satisfy your thirst (about 10 glasses a day).  Rest often, relax, and continue to take your prenatal vitamins to prevent fatigue, stress, and anemia.  Continue breast self-awareness checks.  Avoid chewing and smoking tobacco. Chemicals from cigarettes that pass into breast milk and exposure to secondhand smoke may harm your baby.  Avoid alcohol and drug use, including marijuana. Some medicines that may be harmful to your baby can pass through breast milk. It is important to ask your health care provider before taking any medicine, including all over-the-counter and prescription medicine as well as vitamin and herbal supplements. It is possible to become pregnant while breastfeeding. If birth control is desired, ask your health care provider about options that will be safe for your baby. SEEK MEDICAL CARE IF:  You feel like you want to stop breastfeeding or have become frustrated with breastfeeding.  You have painful breasts or nipples.  Your nipples are cracked or bleeding.  Your breasts are red, tender, or warm.  You have a swollen area on either breast.  You have a fever or chills.  You have nausea or vomiting.  You have drainage other than breast milk from your  nipples.  Your breasts do not become full before feedings by the fifth day after you give birth.  You feel sad and depressed.  Your baby is too sleepy to eat well.  Your baby is having trouble sleeping.   Your baby is wetting less than 3 diapers in a 24-hour period.  Your baby has less than 3 stools in a 24-hour period.  Your baby's skin or the white part of his or her eyes becomes yellow.   Your baby is not gaining weight by 15 days of age. SEEK IMMEDIATE MEDICAL CARE IF:  Your baby is overly tired (lethargic) and does not want to wake up and feed.  Your baby develops an unexplained fever.   This information is not intended to replace advice given to you by your health care provider. Make sure you discuss any questions you have with your health care provider.   Document Released: 09/02/2005 Document Revised: 05/24/2015 Document Reviewed: 02/24/2013 Elsevier Interactive Patient Education [employer].

## 2016-02-19 NOTE — Progress Notes (Signed)
Subjective:  Sylvia Best is a 35 y.o. [id number] at [redacted]w[redacted]d being seen today for ongoing prenatal care.  She is currently monitored for the following issues for this high-risk pregnancy and has Supervision of normal pregnancy, antepartum; Previous cesarean delivery affecting pregnancy, antepartum; History of gestational diabetes in prior pregnancy, currently pregnant; AMA (advanced maternal age) multigravida [id number]; Umbilical pain; Language barrier, cultural differences; Orthostatic dizziness; and Velamentous insertion of umbilical cord, antepartum on her problem list.  Patient reports no complaints.  Contractions: Not present. Vag. Bleeding: None.  Movement: Present. Denies leaking of fluid.   The following portions of the patient's history were reviewed and updated as appropriate: allergies, current medications, past family history, past medical history, past social history, past surgical history and problem list. Problem list updated.  Objective:   Filed Vitals:   02/19/16 1041  BP: 102/63  Pulse: 79    Fetal Status: Fetal Heart Rate (bpm): 145 Fundal Height: 30 cm Movement: Present     General:  Alert, oriented and cooperative. Patient is in no acute distress.  Skin: Skin is warm and dry. No rash noted.   Cardiovascular: Normal heart rate noted  Respiratory: Normal respiratory effort, no problems with respiration noted  Abdomen: Soft, gravid, appropriate for gestational age. Pain/Pressure: Absent     Pelvic: Vag. Bleeding: None      Extremities: Normal range of motion.  Edema: None  Mental Status: Normal mood and affect. Normal behavior. Normal judgment and thought content.   Urinalysis: Urine Protein: Negative Urine Glucose: 2+  Assessment and Plan:  Pregnancy: [id number] at [redacted]w[redacted]d  1. Supervision of normal pregnancy, antepartum, third trimester Continue prenatal care.   2. Diet controlled gestational diabetes mellitus (GDM), antepartum Has 3 BS in her log 87 and 187--came in to MAU for  elevated BS 177 then 144. Wants treatment. Spent good deal of time around needing more data to direct therapy.   Preterm labor symptoms and general obstetric precautions including but not limited to vaginal bleeding, contractions, leaking of fluid and fetal movement were reviewed in detail with the patient. Please refer to After Visit Summary for other counseling recommendations.  Return in 1 week (on 02/26/2016) for [facility], ob visit.   Donnamae Jude, MD

## 2016-02-19 NOTE — Progress Notes (Signed)
Used video interpreter [phone] Reviewed tip of week with patient

## 2016-02-19 NOTE — Progress Notes (Signed)
Diabetes Educator: 02/19/16 Seen today for review of self-care measures for blood glucose control during pregnancy.  Notes that on awakening this morning, she felt weak and not well.  Her blood glucose was 85 on checking.  She then drank 8+ ounces of grape juice.   At this time, she c/o being very tired and unsteady on her feet.   Reports having been taken to the Emergency room on Friday last with feeling weak and not well. Has an Accu Chek Aviva Plus meter.  Has only 3 blood glucose levels in her meter.   Does not give details of her testing schedule. Completed review of blood glucose testing procedure and schedule for monitoring. Verbally lead her through monitoring and her blood glucose on her meter was 178 mg/dl. Encouraged her to drink the 16 ounces of water in her bottle and provided another cup of water to consume over the next few minutes. Reviewed the s/s for hypoglycemia and when to treat, what to treat with and when to recheck and how to retreat if necessary. Reminded her that 70 was a normal blood glucose level and having breakfast would have assisted with her getting her glucose into a normal range. Provided her an Arabic handout and an [place] handout of Nutrition, Diabetes and Pregnancy. She is to next see the RD. Assured her that if her glucose records indicate that she needs medication to control her glucose levels, physicians will address the need.  Meanwhile, recommend following the prescribed diet, walking daily and recording her blood glucose levels. Maggie May, RN, RD, LDN

## 2016-02-20 NOTE — Progress Notes (Signed)
This encounter was created in error - please disregard.

## 2016-02-26 ENCOUNTER — Ambulatory Visit (INDEPENDENT_AMBULATORY_CARE_PROVIDER_SITE_OTHER): Payer: Medicaid Other | Admitting: Obstetrics & Gynecology

## 2016-02-26 VITALS — BP 104/63 | HR 82 | Wt 152.4 lb

## 2016-02-26 DIAGNOSIS — O0993 Supervision of high risk pregnancy, unspecified, third trimester: Secondary | ICD-10-CM

## 2016-02-26 DIAGNOSIS — O34219 Maternal care for unspecified type scar from previous cesarean delivery: Secondary | ICD-10-CM

## 2016-02-26 DIAGNOSIS — O09523 Supervision of elderly multigravida, third trimester: Secondary | ICD-10-CM | POA: Diagnosis not present

## 2016-02-26 DIAGNOSIS — O24419 Gestational diabetes mellitus in pregnancy, unspecified control: Secondary | ICD-10-CM | POA: Diagnosis not present

## 2016-02-26 LAB — POCT URINALYSIS DIP (DEVICE)
BILIRUBIN URINE: NEGATIVE
GLUCOSE, UA: NEGATIVE mg/dL
Hgb urine dipstick: NEGATIVE
KETONES UR: NEGATIVE mg/dL
LEUKOCYTES UA: NEGATIVE
Nitrite: NEGATIVE
PROTEIN: NEGATIVE mg/dL
SPECIFIC GRAVITY, URINE: 1.02 (ref 1.005–1.030)
Urobilinogen, UA: 2 mg/dL — ABNORMAL HIGH (ref 0.0–1.0)
pH: 6.5 (ref 5.0–8.0)

## 2016-02-26 MED ORDER — GLYBURIDE 2.5 MG PO TABS: ORAL_TABLET | ORAL | Status: DC

## 2016-02-26 MED ORDER — GLUCOSE BLOOD VI STRP: ORAL_STRIP | Status: DC

## 2016-02-26 MED ORDER — ACCU-CHEK FASTCLIX LANCETS MISC: 1.0000 [IU] | Freq: Four times a day (QID) | Status: DC

## 2016-02-26 NOTE — Progress Notes (Signed)
Subjective:  Sylvia Best is a 35 y.o. [id number] at [redacted]w[redacted]d being seen today for ongoing prenatal care.  She is currently monitored for the following issues for this high-risk pregnancy and has Supervision of high risk pregnancy in third trimester; Previous cesarean delivery affecting pregnancy, antepartum; Gestational diabetes mellitus (GDM), antepartum; AMA (advanced maternal age) multigravida [id number]; Umbilical pain; Language barrier, cultural differences; Orthostatic dizziness; and Velamentous insertion of umbilical cord, antepartum on her problem list.  Patient reports occasional contractions.  Contractions: Irregular. Vag. Bleeding: None.  Movement: Present. Denies leaking of fluid.   The following portions of the patient's history were reviewed and updated as appropriate: allergies, current medications, past family history, past medical history, past social history, past surgical history and problem list. Problem list updated.  Objective:   Filed Vitals:   02/26/16 0945  BP: 104/63  Pulse: 82  Weight: 152 lb 6.4 oz (69.128 kg)    Fetal Status: Fetal Heart Rate (bpm): 142 Fundal Height: 33 cm Movement: Present     General:  Alert, oriented and cooperative. Patient is in no acute distress.  Skin: Skin is warm and dry. No rash noted.   Cardiovascular: Normal heart rate noted  Respiratory: Normal respiratory effort, no problems with respiration noted  Abdomen: Soft, gravid, appropriate for gestational age. Pain/Pressure: Present     Pelvic: Cervical exam performed Dilation: Closed Effacement (%): 50 Station: -3  Extremities: Normal range of motion.  Edema: None  Mental Status: Normal mood and affect. Normal behavior. Normal judgment and thought content.   Urinalysis: Urine Protein: Negative Urine Glucose: Negative CBGs: Multiple elevated PPs, fasting value >100 x1 and high 90s  Assessment and Plan:  Pregnancy: [id number] at [redacted]w[redacted]d  1. Gestational diabetes mellitus (GDM) in third trimester,  gestational diabetes method of control unspecified Will start oral hypoglycemic therapy.  Also will start 2x/week testing and need delivery at 39 weeks. - glyBURIDE (DIABETA) 2.5 MG tablet; Take 0.5 tablet by mouth with breakfast and at bedtime  Dispense: 30 tablet; Refill: 3 - glucose blood (ACCU-CHEK AVIVA PLUS) test strip; Use as instructed  Dispense: 100 each; Refill: 12 - ACCU-CHEK FASTCLIX LANCETS MISC; 1 Units by Percutaneous route 4 (four) times daily.  Dispense: 100 each; Refill: 12  2. Previous cesarean delivery affecting pregnancy, antepartum Desires TOLAC, form signed 02/26/2016. Already had extensive counseling in past visits.  3. Supervision of high risk pregnancy in third trimester Preterm labor symptoms and general obstetric precautions including but not limited to vaginal bleeding, contractions, leaking of fluid and fetal movement were reviewed in detail with the patient. Please refer to After Visit Summary for other counseling recommendations.  Return in about 1 week (around 03/04/2016) for OB Visit, NST.   Osborne Oman, MD

## 2016-02-26 NOTE — Patient Instructions (Signed)
Return to clinic for any scheduled appointments or obstetric concerns, or go to MAU for evaluation  

## 2016-03-04 ENCOUNTER — Encounter: Payer: Medicaid Other | Admitting: Obstetrics & Gynecology

## 2016-03-04 ENCOUNTER — Ambulatory Visit (INDEPENDENT_AMBULATORY_CARE_PROVIDER_SITE_OTHER): Payer: Medicaid Other | Admitting: Obstetrics & Gynecology

## 2016-03-04 VITALS — BP 102/61 | HR 84 | Wt 154.2 lb

## 2016-03-04 DIAGNOSIS — Z603 Acculturation difficulty: Secondary | ICD-10-CM | POA: Diagnosis not present

## 2016-03-04 DIAGNOSIS — O24415 Gestational diabetes mellitus in pregnancy, controlled by oral hypoglycemic drugs: Secondary | ICD-10-CM

## 2016-03-04 DIAGNOSIS — O09523 Supervision of elderly multigravida, third trimester: Secondary | ICD-10-CM | POA: Diagnosis not present

## 2016-03-04 LAB — POCT URINALYSIS DIP (DEVICE)
Bilirubin Urine: NEGATIVE
Glucose, UA: NEGATIVE mg/dL
Hgb urine dipstick: NEGATIVE
Ketones, ur: NEGATIVE mg/dL
Nitrite: NEGATIVE
Protein, ur: NEGATIVE mg/dL
Specific Gravity, Urine: 1.025 (ref 1.005–1.030)
Urobilinogen, UA: 1 mg/dL (ref 0.0–1.0)
pH: 6.5 (ref 5.0–8.0)

## 2016-03-04 NOTE — Progress Notes (Signed)
Pt states she does not always take the glyburide in the morning because her blood sugar is low.

## 2016-03-05 NOTE — Progress Notes (Signed)
Subjective:  Sylvia Best is a 35 y.o. [id number] at [redacted]w[redacted]d being seen today for ongoing prenatal care.  She is currently monitored for the following issues for this high-risk pregnancy and has Supervision of high risk pregnancy in third trimester; Previous cesarean delivery affecting pregnancy, antepartum; Gestational diabetes mellitus (GDM), antepartum; AMA (advanced maternal age) multigravida [id number]; Umbilical pain; Language barrier, cultural differences; Orthostatic dizziness; and Velamentous insertion of umbilical cord, antepartum on her problem list.  Patient reports Pt states her blood sugar isl ow some mornings so she didn't take a glyburide every night.  Most values were 68-80s.  No extreme lows.  There was one elevated fasting..  Contractions: Irregular. Vag. Bleeding: None.  Movement: Present. Denies leaking of fluid.   The following portions of the patient's history were reviewed and updated as appropriate: allergies, current medications, past family history, past medical history, past social history, past surgical history and problem list. Problem list updated.  Objective:   Filed Vitals:   03/04/16 1149  BP: 102/61  Pulse: 84  Weight: 154 lb 3.2 oz (69.945 kg)    Fetal Status: Fetal Heart Rate (bpm): NST Fundal Height: 33 cm Movement: Present     General:  Alert, oriented and cooperative. Patient is in no acute distress.  Skin: Skin is warm and dry. No rash noted.   Cardiovascular: Normal heart rate noted  Respiratory: Normal respiratory effort, no problems with respiration noted  Abdomen: Soft, gravid, appropriate for gestational age. Pain/Pressure: Present     Pelvic: Cervical exam deferred        Extremities: Normal range of motion.     Mental Status: Normal mood and affect. Normal behavior. Normal judgment and thought content.   Urinalysis: Urine Protein: Negative Urine Glucose: Negative  Assessment and Plan:  Pregnancy: [id number] at [redacted]w[redacted]d  1. Gestational diabetes mellitus  (GDM) controlled on oral hypoglycemic drug, antepartum -Some elevated lunch values.  Pt takes glyburide in the a.m.  May need to increase morning glyburide to cover lunch values.  Will check in one more week. -Cont 2x week testing -Serial [place] for growth per protocol.  Preterm labor symptoms and general obstetric precautions including but not limited to vaginal bleeding, contractions, leaking of fluid and fetal movement were reviewed in detail with the patient. Please refer to After Visit Summary for other counseling recommendations.  Return in about 2 weeks (around 03/18/2016) for Ob fu and NST.   Guss Bunde, MD

## 2016-03-07 ENCOUNTER — Ambulatory Visit (INDEPENDENT_AMBULATORY_CARE_PROVIDER_SITE_OTHER): Payer: Medicaid Other | Admitting: *Deleted

## 2016-03-07 DIAGNOSIS — Z36 Encounter for antenatal screening of mother: Secondary | ICD-10-CM

## 2016-03-07 DIAGNOSIS — O24415 Gestational diabetes mellitus in pregnancy, controlled by oral hypoglycemic drugs: Secondary | ICD-10-CM

## 2016-03-11 NOTE — Progress Notes (Signed)
NST reactive on 03/07/16

## 2016-03-12 ENCOUNTER — Ambulatory Visit (INDEPENDENT_AMBULATORY_CARE_PROVIDER_SITE_OTHER): Payer: Medicaid Other | Admitting: General Practice

## 2016-03-12 VITALS — BP 102/58 | HR 85

## 2016-03-12 DIAGNOSIS — O24415 Gestational diabetes mellitus in pregnancy, controlled by oral hypoglycemic drugs: Secondary | ICD-10-CM | POA: Diagnosis present

## 2016-03-12 NOTE — Progress Notes (Signed)
NST reviewed and reactive.  

## 2016-03-13 ENCOUNTER — Other Ambulatory Visit: Payer: Self-pay | Admitting: Family Medicine

## 2016-03-13 ENCOUNTER — Ambulatory Visit (HOSPITAL_COMMUNITY)
Admission: RE | Admit: 2016-03-13 | Discharge: 2016-03-13 | Disposition: A | Discharge status: Home / Self Care | Payer: Medicaid Other | Source: Ambulatory Visit | Attending: Family Medicine | Admitting: Family Medicine

## 2016-03-13 ENCOUNTER — Encounter (HOSPITAL_COMMUNITY): Payer: Self-pay

## 2016-03-13 DIAGNOSIS — O24415 Gestational diabetes mellitus in pregnancy, controlled by oral hypoglycemic drugs: Secondary | ICD-10-CM

## 2016-03-13 DIAGNOSIS — O34219 Maternal care for unspecified type scar from previous cesarean delivery: Secondary | ICD-10-CM | POA: Insufficient documentation

## 2016-03-13 DIAGNOSIS — O09523 Supervision of elderly multigravida, third trimester: Secondary | ICD-10-CM | POA: Diagnosis not present

## 2016-03-13 DIAGNOSIS — Z3A35 35 weeks gestation of pregnancy: Secondary | ICD-10-CM | POA: Diagnosis not present

## 2016-03-13 DIAGNOSIS — O09293 Supervision of pregnancy with other poor reproductive or obstetric history, third trimester: Secondary | ICD-10-CM | POA: Insufficient documentation

## 2016-03-14 ENCOUNTER — Ambulatory Visit (INDEPENDENT_AMBULATORY_CARE_PROVIDER_SITE_OTHER): Payer: Medicaid Other | Admitting: *Deleted

## 2016-03-14 VITALS — BP 106/66 | HR 89

## 2016-03-14 DIAGNOSIS — O43122 Velamentous insertion of umbilical cord, second trimester: Secondary | ICD-10-CM

## 2016-03-14 DIAGNOSIS — O24415 Gestational diabetes mellitus in pregnancy, controlled by oral hypoglycemic drugs: Secondary | ICD-10-CM | POA: Diagnosis present

## 2016-03-15 ENCOUNTER — Other Ambulatory Visit (HOSPITAL_COMMUNITY): Payer: Medicaid Other

## 2016-03-18 ENCOUNTER — Ambulatory Visit (INDEPENDENT_AMBULATORY_CARE_PROVIDER_SITE_OTHER): Payer: Medicaid Other | Admitting: Obstetrics and Gynecology

## 2016-03-18 ENCOUNTER — Other Ambulatory Visit: Payer: Medicaid Other | Admitting: Obstetrics and Gynecology

## 2016-03-18 VITALS — BP 110/74 | HR 98 | Wt 156.0 lb

## 2016-03-18 DIAGNOSIS — Z113 Encounter for screening for infections with a predominantly sexual mode of transmission: Secondary | ICD-10-CM

## 2016-03-18 DIAGNOSIS — O24415 Gestational diabetes mellitus in pregnancy, controlled by oral hypoglycemic drugs: Secondary | ICD-10-CM

## 2016-03-18 DIAGNOSIS — Z603 Acculturation difficulty: Secondary | ICD-10-CM | POA: Diagnosis not present

## 2016-03-18 DIAGNOSIS — O0993 Supervision of high risk pregnancy, unspecified, third trimester: Secondary | ICD-10-CM

## 2016-03-18 DIAGNOSIS — O34219 Maternal care for unspecified type scar from previous cesarean delivery: Secondary | ICD-10-CM | POA: Diagnosis not present

## 2016-03-18 DIAGNOSIS — O09523 Supervision of elderly multigravida, third trimester: Secondary | ICD-10-CM | POA: Diagnosis not present

## 2016-03-18 LAB — POCT URINALYSIS DIP (DEVICE)
Glucose, UA: NEGATIVE mg/dL
HGB URINE DIPSTICK: NEGATIVE
Ketones, ur: NEGATIVE mg/dL
LEUKOCYTES UA: NEGATIVE
Nitrite: NEGATIVE
PH: 6.5 (ref 5.0–8.0)
Protein, ur: 30 mg/dL — AB
SPECIFIC GRAVITY, URINE: 1.025 (ref 1.005–1.030)
UROBILINOGEN UA: 1 mg/dL (ref 0.0–1.0)

## 2016-03-18 NOTE — Progress Notes (Signed)
Subjective:  Sylvia Best is a 35 y.o. [id number] at [redacted]w[redacted]d being seen today for ongoing prenatal care.  She is currently monitored for the following issues for this high-risk pregnancy and has Supervision of high risk pregnancy in third trimester; Previous cesarean delivery affecting pregnancy, antepartum; Gestational diabetes mellitus (GDM), antepartum; AMA (advanced maternal age) multigravida [id number]; Umbilical pain; Language barrier, cultural differences; Orthostatic dizziness; and Velamentous insertion of umbilical cord, antepartum on her problem list.  Patient reports no complaints.  Contractions: Not present. Vag. Bleeding: None.  Movement: Present. Denies leaking of fluid.   The following portions of the patient's history were reviewed and updated as appropriate: allergies, current medications, past family history, past medical history, past social history, past surgical history and problem list. Problem list updated.  Objective:   Filed Vitals:   03/18/16 0851 03/18/16 0853  BP: 112/98 110/74  Pulse: 91 98  Weight: 156 lb (70.761 kg)     Fetal Status: Fetal Heart Rate (bpm): 147 Fundal Height: 36 cm Movement: Present     General:  Alert, oriented and cooperative. Patient is in no acute distress.  Skin: Skin is warm and dry. No rash noted.   Cardiovascular: Normal heart rate noted  Respiratory: Normal respiratory effort, no problems with respiration noted  Abdomen: Soft, gravid, appropriate for gestational age. Pain/Pressure: Present     Pelvic: Cervical exam performed Dilation: Fingertip Effacement (%): Thick Station: Ballotable  Extremities: Normal range of motion.  Edema: Trace  Mental Status: Normal mood and affect. Normal behavior. Normal judgment and thought content.   Urinalysis: Urine Protein: 1+ Urine Glucose: Negative  Assessment and Plan:  Pregnancy: [id number] at [redacted]w[redacted]d  1. AMA (advanced maternal age) multigravida 58+, third trimester  - Culture, beta strep (group b only) -  GC/Chlamydia probe amp ([place])not at [facility]  2. Supervision of high risk pregnancy in third trimester Cultures performed today  3. Previous cesarean delivery affecting pregnancy, antepartum Patient desires TOLAC  4. Gestational diabetes mellitus (GDM) controlled on oral hypoglycemic drug, antepartum CBG reviewed and great majority within range pp as high as 158 on 3 occasions. Patient admits to over indulging Continue glyburide BID NST reviewed and reactive  5. Language barrier, cultural differences   Preterm labor symptoms and general obstetric precautions including but not limited to vaginal bleeding, contractions, leaking of fluid and fetal movement were reviewed in detail with the patient. Please refer to After Visit Summary for other counseling recommendations.  Return in about 1 week (around 03/25/2016).   Mora Bellman, MD

## 2016-03-18 NOTE — Progress Notes (Signed)
36 wk cultures today

## 2016-03-20 LAB — CULTURE, BETA STREP (GROUP B ONLY)

## 2016-03-21 ENCOUNTER — Ambulatory Visit (INDEPENDENT_AMBULATORY_CARE_PROVIDER_SITE_OTHER): Payer: Medicaid Other | Admitting: Obstetrics & Gynecology

## 2016-03-21 VITALS — BP 112/64 | HR 76

## 2016-03-21 DIAGNOSIS — O24415 Gestational diabetes mellitus in pregnancy, controlled by oral hypoglycemic drugs: Secondary | ICD-10-CM | POA: Diagnosis present

## 2016-03-21 DIAGNOSIS — Z36 Encounter for antenatal screening of mother: Secondary | ICD-10-CM | POA: Diagnosis not present

## 2016-03-21 NOTE — Progress Notes (Signed)
NST 03/21/16 reactive

## 2016-03-21 NOTE — Progress Notes (Signed)
Interpreter [facility] present for encounter.  Pt states that due to the position of the baby , she would like to have a C/Section instead of TOLAC.  Pt was advised to discuss with provider @ visit on 7/10. She agreed.

## 2016-03-25 ENCOUNTER — Ambulatory Visit (INDEPENDENT_AMBULATORY_CARE_PROVIDER_SITE_OTHER): Payer: Medicaid Other | Admitting: Obstetrics and Gynecology

## 2016-03-25 VITALS — BP 116/70 | HR 82 | Wt 157.7 lb

## 2016-03-25 DIAGNOSIS — O34219 Maternal care for unspecified type scar from previous cesarean delivery: Secondary | ICD-10-CM

## 2016-03-25 DIAGNOSIS — O43123 Velamentous insertion of umbilical cord, third trimester: Secondary | ICD-10-CM

## 2016-03-25 DIAGNOSIS — O24415 Gestational diabetes mellitus in pregnancy, controlled by oral hypoglycemic drugs: Secondary | ICD-10-CM

## 2016-03-25 DIAGNOSIS — O09523 Supervision of elderly multigravida, third trimester: Secondary | ICD-10-CM

## 2016-03-25 DIAGNOSIS — O320XX0 Maternal care for unstable lie, not applicable or unspecified: Secondary | ICD-10-CM | POA: Diagnosis not present

## 2016-03-25 DIAGNOSIS — O24419 Gestational diabetes mellitus in pregnancy, unspecified control: Secondary | ICD-10-CM

## 2016-03-25 DIAGNOSIS — O0993 Supervision of high risk pregnancy, unspecified, third trimester: Secondary | ICD-10-CM

## 2016-03-25 DIAGNOSIS — Z603 Acculturation difficulty: Secondary | ICD-10-CM | POA: Diagnosis not present

## 2016-03-25 NOTE — Progress Notes (Signed)
Subjective:  Sylvia Best is a 35 y.o. [id number] at [redacted]w[redacted]d being seen today for ongoing prenatal care.  She is currently monitored for the following issues for this high-risk pregnancy and has Supervision of high risk pregnancy in third trimester; Previous cesarean delivery affecting pregnancy, antepartum; Gestational diabetes mellitus (GDM), antepartum; AMA (advanced maternal age) multigravida [id number]; Umbilical pain; Language barrier, cultural differences; Orthostatic dizziness; Velamentous insertion of umbilical cord, antepartum; and Unstable lie of fetus, antepartum on her problem list.  Patient reports no complaints.   Contractions: Irregular. Vag. Bleeding: None.  Movement: Present. Denies leaking of fluid.   The following portions of the patient's history were reviewed and updated as appropriate: allergies, current medications, past family history, past medical history, past social history, past surgical history and problem list. Problem list updated.  Objective:   Filed Vitals:   03/25/16 0913  BP: 116/70  Pulse: 82  Weight: 157 lb 11.2 oz (71.532 kg)    Fetal Status: Fetal Heart Rate (bpm): NST   Movement: Present  Presentation: Complete Breech  General:  Alert, oriented and cooperative. Patient is in no acute distress.  Skin: Skin is warm and dry. No rash noted.   Cardiovascular: Normal heart rate noted  Respiratory: Normal respiratory effort, no problems with respiration noted  Abdomen: Soft, gravid, appropriate for gestational age. Pain/Pressure: Present     Pelvic:  Cervical exam deferred        Extremities: Normal range of motion.     Mental Status: Normal mood and affect. Normal behavior. Normal judgment and thought content.   Urinalysis:      Assessment and Plan:  Pregnancy: [id number] at [redacted]w[redacted]d  1. Gestational diabetes mellitus (GDM) controlled on oral hypoglycemic drug, antepartum rNST today. AFI nv. Growth scan on 6/28 normal EFW/AC/AFI Good control on glyburide 1.25/1.25,  per BS log today  2. Supervision of high risk pregnancy in third trimester See above. GBS neg  3. Previous cesarean delivery affecting pregnancy, antepartum Pt would like rpt if still breech. Request sent for 39-40wk rpt c/s (prefers mon/tues)  4. Language barrier, cultural differences Interpreter used  5. Velamentous insertion of umbilical cord, antepartum, third trimester No issues.   6. Unstable lie of fetus, antepartum, not applicable or unspecified fetus Breech today. Desires rpt if still breech. Will get at least one more afi before delivery. If ceph, desires tolac  Term labor symptoms and general obstetric precautions including but not limited to vaginal bleeding, contractions, leaking of fluid and fetal movement were reviewed in detail with the patient. Please refer to After Visit Summary for other counseling recommendations.   RTC 2x/week   Aletha Halim, MD

## 2016-03-25 NOTE — Addendum Note (Signed)
Addended by: Langston Reusing on: 03/25/2016 11:36 AM   Modules accepted: Level of Service

## 2016-03-25 NOTE — Progress Notes (Signed)
Complete breech presentation per bedside US today.  Pt now desires repeat C/S.

## 2016-03-26 ENCOUNTER — Other Ambulatory Visit: Payer: Self-pay | Admitting: Family Medicine

## 2016-03-28 ENCOUNTER — Ambulatory Visit (INDEPENDENT_AMBULATORY_CARE_PROVIDER_SITE_OTHER): Payer: Medicaid Other | Admitting: Advanced Practice Midwife

## 2016-03-28 VITALS — BP 109/71 | HR 87

## 2016-03-28 DIAGNOSIS — O24419 Gestational diabetes mellitus in pregnancy, unspecified control: Secondary | ICD-10-CM

## 2016-03-28 DIAGNOSIS — Z36 Encounter for antenatal screening of mother: Secondary | ICD-10-CM | POA: Diagnosis not present

## 2016-03-28 NOTE — Progress Notes (Signed)
NST reactive.

## 2016-03-29 ENCOUNTER — Telehealth (HOSPITAL_COMMUNITY): Payer: Self-pay | Admitting: *Deleted

## 2016-03-29 NOTE — Pre-Procedure Instructions (Signed)
Interpreter number [phone]

## 2016-03-29 NOTE — Telephone Encounter (Signed)
Preadmission screen  

## 2016-04-01 ENCOUNTER — Encounter (HOSPITAL_COMMUNITY): Payer: Self-pay

## 2016-04-01 ENCOUNTER — Ambulatory Visit (INDEPENDENT_AMBULATORY_CARE_PROVIDER_SITE_OTHER): Payer: Medicaid Other | Admitting: Obstetrics and Gynecology

## 2016-04-01 VITALS — BP 105/72 | HR 84 | Wt 156.0 lb

## 2016-04-01 DIAGNOSIS — O24415 Gestational diabetes mellitus in pregnancy, controlled by oral hypoglycemic drugs: Secondary | ICD-10-CM | POA: Diagnosis not present

## 2016-04-01 DIAGNOSIS — O320XX0 Maternal care for unstable lie, not applicable or unspecified: Secondary | ICD-10-CM | POA: Diagnosis not present

## 2016-04-01 LAB — POCT URINALYSIS DIP (DEVICE)
Bilirubin Urine: NEGATIVE
GLUCOSE, UA: NEGATIVE mg/dL
Hgb urine dipstick: NEGATIVE
Ketones, ur: NEGATIVE mg/dL
Leukocytes, UA: NEGATIVE
NITRITE: NEGATIVE
PH: 7 (ref 5.0–8.0)
PROTEIN: NEGATIVE mg/dL
Specific Gravity, Urine: 1.02 (ref 1.005–1.030)
UROBILINOGEN UA: 0.2 mg/dL (ref 0.0–1.0)

## 2016-04-01 NOTE — Progress Notes (Signed)
C/S scheduled on 7/24

## 2016-04-01 NOTE — Pre-Procedure Instructions (Signed)
Interpreter number [phone]

## 2016-04-01 NOTE — Progress Notes (Signed)
Subjective:  Sylvia Best is a 35 y.o. [id number] at [redacted]w[redacted]d being seen today for ongoing prenatal care.  She is currently monitored for the following issues for this high-risk pregnancy and has Supervision of high risk pregnancy in third trimester; Previous cesarean delivery affecting pregnancy, antepartum; GDM, class A2; AMA (advanced maternal age) multigravida [id number]; Umbilical pain; Language barrier, cultural differences; Orthostatic dizziness; Velamentous insertion of umbilical cord, antepartum; and Unstable lie of fetus, antepartum on her problem list.  Patient reports no complaints.  Contractions: Irregular. Vag. Bleeding: None.  Movement: Present. Denies leaking of fluid.   The following portions of the patient's history were reviewed and updated as appropriate: allergies, current medications, past family history, past medical history, past social history, past surgical history and problem list. Problem list updated.  Objective:   Filed Vitals:   04/01/16 1127  BP: 105/72  Pulse: 84  Weight: 156 lb (70.761 kg)    Fetal Status: Fetal Heart Rate (bpm): NST   Movement: Present     General:  Alert, oriented and cooperative. Patient is in no acute distress.  Skin: Skin is warm and dry. No rash noted.   Cardiovascular: Normal heart rate noted  Respiratory: Normal respiratory effort, no problems with respiration noted  Abdomen: Soft, gravid, appropriate for gestational age. Pain/Pressure: Present     Pelvic:  Cervical exam deferred        Extremities: Normal range of motion.  Edema: None  Mental Status: Normal mood and affect. Normal behavior. Normal judgment and thought content.   Urinalysis:      Assessment and Plan:  Pregnancy: [id number] at [redacted]w[redacted]d  1. Gestational diabetes mellitus (GDM) controlled on oral hypoglycemic drug, antepartum - sugars appropriate, continue glyburide 1.25 bid - nst reviewed and reactive, f/u Friday for nst and avi  2. Breech, hx c/s - appears breech today - will  confirm w/ u/s on Friday - has c/s scheduled for this coming Monday if remains breech  Term labor symptoms and general obstetric precautions including but not limited to vaginal bleeding, contractions, leaking of fluid and fetal movement were reviewed in detail with the patient. Please refer to After Visit Summary for other counseling recommendations.  Return in about 6 weeks (around 05/13/2016) for PP appt   C/S on 7/24.   Gwynne Edinger, MD

## 2016-04-05 ENCOUNTER — Inpatient Hospital Stay (HOSPITAL_COMMUNITY)
Admission: AD | Admit: 2016-04-05 | Discharge: 2016-04-08 | DRG: 766 | Disposition: A | Discharge status: Home / Self Care | Payer: Medicaid Other | Source: Ambulatory Visit | Attending: Obstetrics & Gynecology | Admitting: Obstetrics & Gynecology

## 2016-04-05 ENCOUNTER — Encounter (HOSPITAL_COMMUNITY)
Admission: AD | Disposition: A | Discharge status: Home / Self Care | Payer: Self-pay | Source: Ambulatory Visit | Attending: Obstetrics & Gynecology

## 2016-04-05 ENCOUNTER — Other Ambulatory Visit: Payer: Medicaid Other | Admitting: Obstetrics & Gynecology

## 2016-04-05 ENCOUNTER — Inpatient Hospital Stay (HOSPITAL_COMMUNITY): Payer: Medicaid Other | Admitting: Anesthesiology

## 2016-04-05 ENCOUNTER — Encounter (HOSPITAL_COMMUNITY): Payer: Self-pay

## 2016-04-05 ENCOUNTER — Encounter (HOSPITAL_COMMUNITY)
Admission: RE | Admit: 2016-04-05 | Discharge: 2016-04-05 | Disposition: A | Discharge status: Home / Self Care | Payer: Medicaid Other | Source: Ambulatory Visit | Attending: Family Medicine | Admitting: Family Medicine

## 2016-04-05 DIAGNOSIS — O321XX0 Maternal care for breech presentation, not applicable or unspecified: Principal | ICD-10-CM | POA: Diagnosis present

## 2016-04-05 DIAGNOSIS — J45909 Unspecified asthma, uncomplicated: Secondary | ICD-10-CM | POA: Diagnosis present

## 2016-04-05 DIAGNOSIS — Z98891 History of uterine scar from previous surgery: Secondary | ICD-10-CM

## 2016-04-05 DIAGNOSIS — Z833 Family history of diabetes mellitus: Secondary | ICD-10-CM | POA: Diagnosis not present

## 2016-04-05 DIAGNOSIS — Z3A39 39 weeks gestation of pregnancy: Secondary | ICD-10-CM

## 2016-04-05 DIAGNOSIS — O43123 Velamentous insertion of umbilical cord, third trimester: Secondary | ICD-10-CM

## 2016-04-05 DIAGNOSIS — O09523 Supervision of elderly multigravida, third trimester: Secondary | ICD-10-CM

## 2016-04-05 DIAGNOSIS — O34219 Maternal care for unspecified type scar from previous cesarean delivery: Secondary | ICD-10-CM

## 2016-04-05 DIAGNOSIS — Z8249 Family history of ischemic heart disease and other diseases of the circulatory system: Secondary | ICD-10-CM

## 2016-04-05 DIAGNOSIS — O9952 Diseases of the respiratory system complicating childbirth: Secondary | ICD-10-CM | POA: Diagnosis present

## 2016-04-05 DIAGNOSIS — O24425 Gestational diabetes mellitus in childbirth, controlled by oral hypoglycemic drugs: Secondary | ICD-10-CM | POA: Diagnosis present

## 2016-04-05 DIAGNOSIS — O9962 Diseases of the digestive system complicating childbirth: Secondary | ICD-10-CM | POA: Diagnosis present

## 2016-04-05 DIAGNOSIS — K219 Gastro-esophageal reflux disease without esophagitis: Secondary | ICD-10-CM | POA: Diagnosis present

## 2016-04-05 DIAGNOSIS — O34211 Maternal care for low transverse scar from previous cesarean delivery: Secondary | ICD-10-CM | POA: Diagnosis present

## 2016-04-05 DIAGNOSIS — O0993 Supervision of high risk pregnancy, unspecified, third trimester: Secondary | ICD-10-CM

## 2016-04-05 DIAGNOSIS — O24419 Gestational diabetes mellitus in pregnancy, unspecified control: Secondary | ICD-10-CM

## 2016-04-05 LAB — BASIC METABOLIC PANEL
ANION GAP: 8 (ref 5–15)
BUN: 7 mg/dL (ref 6–20)
CALCIUM: 8.6 mg/dL — AB (ref 8.9–10.3)
CO2: 20 mmol/L — AB (ref 22–32)
Chloride: 101 mmol/L (ref 101–111)
Creatinine, Ser: 0.33 mg/dL — ABNORMAL LOW (ref 0.44–1.00)
GFR calc non Af Amer: >60 mL/min (ref >60)
GLUCOSE: 79 mg/dL (ref 65–99)
POTASSIUM: 3.8 mmol/L (ref 3.5–5.1)
Sodium: 129 mmol/L — ABNORMAL LOW (ref 135–145)

## 2016-04-05 LAB — GLUCOSE, CAPILLARY
GLUCOSE-CAPILLARY: 83 mg/dL (ref 65–99)
Glucose-Capillary: 69 mg/dL (ref 65–99)

## 2016-04-05 LAB — CBC
HEMATOCRIT: 31.9 % — AB (ref 36.0–46.0)
HEMOGLOBIN: 11.1 g/dL — AB (ref 12.0–15.0)
MCH: 29.8 pg (ref 26.0–34.0)
MCHC: 34.8 g/dL (ref 30.0–36.0)
MCV: 85.5 fL (ref 78.0–100.0)
Platelets: 213 10*3/uL (ref 150–400)
RBC: 3.73 MIL/uL — AB (ref 3.87–5.11)
RDW: 13.2 % (ref 11.5–15.5)
WBC: 6.6 10*3/uL (ref 4.0–10.5)

## 2016-04-05 LAB — TYPE AND SCREEN
ABO/RH(D): B POS
ANTIBODY SCREEN: NEGATIVE

## 2016-04-05 SURGERY — Surgical Case
Anesthesia: Spinal

## 2016-04-05 MED ORDER — NALBUPHINE HCL 10 MG/ML IJ SOLN: 5.0000 mg | Freq: Once | INTRAMUSCULAR | Status: AC | PRN

## 2016-04-05 MED ORDER — DIPHENHYDRAMINE HCL 25 MG PO CAPS
25.0000 mg | ORAL_CAPSULE | ORAL | Status: DC | PRN
  Filled 2016-04-05: qty 1

## 2016-04-05 MED ORDER — NALOXONE HCL 0.4 MG/ML IJ SOLN: 0.4000 mg | INTRAMUSCULAR | Status: DC | PRN

## 2016-04-05 MED ORDER — SCOPOLAMINE 1 MG/3DAYS TD PT72
MEDICATED_PATCH | TRANSDERMAL | Status: DC | PRN
  Administered 2016-04-05: 1 via TRANSDERMAL

## 2016-04-05 MED ORDER — OXYCODONE-ACETAMINOPHEN 5-325 MG PO TABS
1.0000 | ORAL_TABLET | ORAL | Status: DC | PRN
  Administered 2016-04-06 (×2): 1 via ORAL
  Filled 2016-04-05 (×2): qty 1

## 2016-04-05 MED ORDER — FERROUS SULFATE 325 (65 FE) MG PO TABS
325.0000 mg | ORAL_TABLET | Freq: Two times a day (BID) | ORAL | Status: DC
  Administered 2016-04-06 – 2016-04-08 (×5): 325 mg via ORAL
  Filled 2016-04-05 (×5): qty 1

## 2016-04-05 MED ORDER — KETOROLAC TROMETHAMINE 30 MG/ML IJ SOLN
INTRAMUSCULAR | Status: AC
  Filled 2016-04-05: qty 1

## 2016-04-05 MED ORDER — PHENYLEPHRINE 8 MG IN D5W 100 ML (0.08MG/ML) PREMIX OPTIME
INJECTION | INTRAVENOUS | Status: DC | PRN
  Administered 2016-04-05: 60 ug/min via INTRAVENOUS

## 2016-04-05 MED ORDER — TERBUTALINE SULFATE 1 MG/ML IJ SOLN: 0.2500 mg | Freq: Once | INTRAMUSCULAR | Status: DC

## 2016-04-05 MED ORDER — DEXAMETHASONE SODIUM PHOSPHATE 4 MG/ML IJ SOLN
INTRAMUSCULAR | Status: DC | PRN
  Administered 2016-04-05: 4 mg via INTRAVENOUS

## 2016-04-05 MED ORDER — SCOPOLAMINE 1 MG/3DAYS TD PT72: 1.0000 | MEDICATED_PATCH | Freq: Once | TRANSDERMAL | Status: DC

## 2016-04-05 MED ORDER — OXYTOCIN 40 UNITS IN LACTATED RINGERS INFUSION - SIMPLE MED: 2.5000 [IU]/h | INTRAVENOUS | Status: AC

## 2016-04-05 MED ORDER — DEXAMETHASONE SODIUM PHOSPHATE 10 MG/ML IJ SOLN
INTRAMUSCULAR | Status: AC
  Filled 2016-04-05: qty 1

## 2016-04-05 MED ORDER — DIBUCAINE 1 % RE OINT: 1.0000 "application " | TOPICAL_OINTMENT | RECTAL | Status: DC | PRN

## 2016-04-05 MED ORDER — SCOPOLAMINE 1 MG/3DAYS TD PT72
MEDICATED_PATCH | TRANSDERMAL | Status: AC
  Filled 2016-04-05: qty 1

## 2016-04-05 MED ORDER — NALBUPHINE HCL 10 MG/ML IJ SOLN: 5.0000 mg | INTRAMUSCULAR | Status: DC | PRN

## 2016-04-05 MED ORDER — ONDANSETRON HCL 4 MG/2ML IJ SOLN
4.0000 mg | Freq: Three times a day (TID) | INTRAMUSCULAR | Status: DC | PRN
Start: 2016-04-05 — End: 2016-04-08

## 2016-04-05 MED ORDER — DIPHENHYDRAMINE HCL 25 MG PO CAPS
25.0000 mg | ORAL_CAPSULE | Freq: Four times a day (QID) | ORAL | Status: DC | PRN
  Administered 2016-04-05: 25 mg via ORAL
  Filled 2016-04-05: qty 1

## 2016-04-05 MED ORDER — FAMOTIDINE IN NACL 20-0.9 MG/50ML-% IV SOLN
20.0000 mg | Freq: Once | INTRAVENOUS | Status: AC
  Administered 2016-04-05: 20 mg via INTRAVENOUS
  Filled 2016-04-05: qty 50

## 2016-04-05 MED ORDER — NALBUPHINE HCL 10 MG/ML IJ SOLN
INTRAMUSCULAR | Status: AC
  Filled 2016-04-05: qty 1

## 2016-04-05 MED ORDER — TERBUTALINE SULFATE 1 MG/ML IJ SOLN
0.2500 mg | INTRAMUSCULAR | Status: AC
  Administered 2016-04-05: 0.25 mg via SUBCUTANEOUS
  Filled 2016-04-05: qty 1

## 2016-04-05 MED ORDER — FENTANYL CITRATE (PF) 100 MCG/2ML IJ SOLN
INTRAMUSCULAR | Status: DC | PRN
  Administered 2016-04-05: 40 ug via INTRAVENOUS
  Administered 2016-04-05: 10 ug via INTRATHECAL
  Administered 2016-04-05: 50 ug via INTRAVENOUS

## 2016-04-05 MED ORDER — BUPIVACAINE IN DEXTROSE 0.75-8.25 % IT SOLN
INTRATHECAL | Status: DC | PRN
  Administered 2016-04-05: 1.5 mL via INTRATHECAL

## 2016-04-05 MED ORDER — MORPHINE SULFATE-NACL 0.5-0.9 MG/ML-% IV SOSY
PREFILLED_SYRINGE | INTRAVENOUS | Status: AC
  Filled 2016-04-05: qty 1

## 2016-04-05 MED ORDER — FENTANYL CITRATE (PF) 100 MCG/2ML IJ SOLN
25.0000 ug | INTRAMUSCULAR | Status: DC | PRN
  Administered 2016-04-05: 25 ug via INTRAVENOUS

## 2016-04-05 MED ORDER — ONDANSETRON HCL 4 MG/2ML IJ SOLN
INTRAMUSCULAR | Status: AC
  Filled 2016-04-05: qty 2

## 2016-04-05 MED ORDER — ERYTHROMYCIN 5 MG/GM OP OINT
TOPICAL_OINTMENT | OPHTHALMIC | Status: AC
  Filled 2016-04-05: qty 1

## 2016-04-05 MED ORDER — SIMETHICONE 80 MG PO CHEW
80.0000 mg | CHEWABLE_TABLET | ORAL | Status: DC | PRN
  Administered 2016-04-06 – 2016-04-07 (×4): 80 mg via ORAL
  Filled 2016-04-05 (×4): qty 1

## 2016-04-05 MED ORDER — MEPERIDINE HCL 25 MG/ML IJ SOLN: 6.2500 mg | INTRAMUSCULAR | Status: DC | PRN

## 2016-04-05 MED ORDER — VITAMIN K1 1 MG/0.5ML IJ SOLN
INTRAMUSCULAR | Status: AC
  Filled 2016-04-05: qty 0.5

## 2016-04-05 MED ORDER — KETOROLAC TROMETHAMINE 30 MG/ML IJ SOLN: 30.0000 mg | Freq: Four times a day (QID) | INTRAMUSCULAR | Status: AC | PRN

## 2016-04-05 MED ORDER — PHENYLEPHRINE 8 MG IN D5W 100 ML (0.08MG/ML) PREMIX OPTIME
INJECTION | INTRAVENOUS | Status: AC
  Filled 2016-04-05: qty 100

## 2016-04-05 MED ORDER — TETANUS-DIPHTH-ACELL PERTUSSIS 5-2.5-18.5 LF-MCG/0.5 IM SUSP: 0.5000 mL | Freq: Once | INTRAMUSCULAR | Status: DC

## 2016-04-05 MED ORDER — MENTHOL 3 MG MT LOZG: 1.0000 | LOZENGE | OROMUCOSAL | Status: DC | PRN

## 2016-04-05 MED ORDER — MAGNESIUM HYDROXIDE 400 MG/5ML PO SUSP: 30.0000 mL | ORAL | Status: DC | PRN

## 2016-04-05 MED ORDER — KETOROLAC TROMETHAMINE 30 MG/ML IJ SOLN
30.0000 mg | Freq: Four times a day (QID) | INTRAMUSCULAR | Status: AC | PRN
  Administered 2016-04-05: 30 mg via INTRAMUSCULAR

## 2016-04-05 MED ORDER — FENTANYL CITRATE (PF) 100 MCG/2ML IJ SOLN
INTRAMUSCULAR | Status: AC
  Filled 2016-04-05: qty 2

## 2016-04-05 MED ORDER — LACTATED RINGERS IV SOLN
INTRAVENOUS | Status: DC
Start: 2016-04-05 — End: 2016-04-08
  Administered 2016-04-05: 15:00:00 via INTRAVENOUS

## 2016-04-05 MED ORDER — PROMETHAZINE HCL 25 MG/ML IJ SOLN: 6.2500 mg | INTRAMUSCULAR | Status: DC | PRN

## 2016-04-05 MED ORDER — SOD CITRATE-CITRIC ACID 500-334 MG/5ML PO SOLN
30.0000 mL | Freq: Once | ORAL | Status: AC
  Administered 2016-04-05: 30 mL via ORAL
  Filled 2016-04-05: qty 15

## 2016-04-05 MED ORDER — ACETAMINOPHEN 325 MG PO TABS
650.0000 mg | ORAL_TABLET | ORAL | Status: DC | PRN
  Administered 2016-04-05: 650 mg via ORAL
  Filled 2016-04-05: qty 2

## 2016-04-05 MED ORDER — NAT-RUL PRENATAL VITAMINS 28-0.8 MG PO TABS: 1.0000 | ORAL_TABLET | Freq: Every day | ORAL | Status: DC

## 2016-04-05 MED ORDER — NALBUPHINE HCL 10 MG/ML IJ SOLN
5.0000 mg | Freq: Once | INTRAMUSCULAR | Status: AC | PRN
  Administered 2016-04-05: 5 mg via INTRAVENOUS

## 2016-04-05 MED ORDER — BUPIVACAINE HCL (PF) 0.5 % IJ SOLN
INTRAMUSCULAR | Status: AC
  Filled 2016-04-05: qty 30

## 2016-04-05 MED ORDER — SIMETHICONE 80 MG PO CHEW
80.0000 mg | CHEWABLE_TABLET | ORAL | Status: DC
  Administered 2016-04-06 – 2016-04-08 (×3): 80 mg via ORAL
  Filled 2016-04-05 (×3): qty 1

## 2016-04-05 MED ORDER — OXYTOCIN 10 UNIT/ML IJ SOLN
40.0000 [IU] | INTRAVENOUS | Status: DC | PRN
  Administered 2016-04-05: 40 [IU] via INTRAVENOUS

## 2016-04-05 MED ORDER — LACTATED RINGERS IV BOLUS (SEPSIS)
1000.0000 mL | Freq: Once | INTRAVENOUS | Status: AC
  Administered 2016-04-05: 1000 mL via INTRAVENOUS

## 2016-04-05 MED ORDER — COCONUT OIL OIL: 1.0000 "application " | TOPICAL_OIL | Status: DC | PRN

## 2016-04-05 MED ORDER — BUPIVACAINE HCL (PF) 0.5 % IJ SOLN
INTRAMUSCULAR | Status: DC | PRN
  Administered 2016-04-05: 30 mL

## 2016-04-05 MED ORDER — MORPHINE SULFATE (PF) 0.5 MG/ML IJ SOLN
INTRAMUSCULAR | Status: DC | PRN
  Administered 2016-04-05: .2 mg via INTRATHECAL

## 2016-04-05 MED ORDER — SODIUM CHLORIDE 0.9% FLUSH: 3.0000 mL | INTRAVENOUS | Status: DC | PRN

## 2016-04-05 MED ORDER — NALOXONE HCL 2 MG/2ML IJ SOSY
1.0000 ug/kg/h | PREFILLED_SYRINGE | INTRAVENOUS | Status: DC | PRN
  Filled 2016-04-05: qty 2

## 2016-04-05 MED ORDER — LACTATED RINGERS IV SOLN
INTRAVENOUS | Status: DC | PRN
  Administered 2016-04-05: 11:00:00 via INTRAVENOUS

## 2016-04-05 MED ORDER — SENNOSIDES-DOCUSATE SODIUM 8.6-50 MG PO TABS
2.0000 | ORAL_TABLET | ORAL | Status: DC
  Administered 2016-04-06 – 2016-04-08 (×3): 2 via ORAL
  Filled 2016-04-05 (×3): qty 2

## 2016-04-05 MED ORDER — LACTATED RINGERS IV SOLN
INTRAVENOUS | Status: DC
  Administered 2016-04-05 (×2): via INTRAVENOUS

## 2016-04-05 MED ORDER — PRENATAL MULTIVITAMIN CH
1.0000 | ORAL_TABLET | Freq: Every day | ORAL | Status: DC
  Administered 2016-04-06 – 2016-04-08 (×3): 1 via ORAL
  Filled 2016-04-05 (×2): qty 1

## 2016-04-05 MED ORDER — OXYCODONE-ACETAMINOPHEN 5-325 MG PO TABS
2.0000 | ORAL_TABLET | ORAL | Status: DC | PRN
Start: 2016-04-05 — End: 2016-04-08
  Administered 2016-04-06 – 2016-04-08 (×6): 2 via ORAL
  Filled 2016-04-05 (×6): qty 2

## 2016-04-05 MED ORDER — DIPHENHYDRAMINE HCL 50 MG/ML IJ SOLN: 12.5000 mg | INTRAMUSCULAR | Status: DC | PRN

## 2016-04-05 MED ORDER — IBUPROFEN 600 MG PO TABS
600.0000 mg | ORAL_TABLET | Freq: Four times a day (QID) | ORAL | Status: DC
  Administered 2016-04-06 – 2016-04-08 (×11): 600 mg via ORAL
  Filled 2016-04-05 (×10): qty 1

## 2016-04-05 MED ORDER — ONDANSETRON HCL 4 MG/2ML IJ SOLN
INTRAMUSCULAR | Status: DC | PRN
  Administered 2016-04-05: 4 mg via INTRAVENOUS

## 2016-04-05 MED ORDER — ZOLPIDEM TARTRATE 5 MG PO TABS: 5.0000 mg | ORAL_TABLET | Freq: Every evening | ORAL | Status: DC | PRN

## 2016-04-05 MED ORDER — WITCH HAZEL-GLYCERIN EX PADS: 1.0000 "application " | MEDICATED_PAD | CUTANEOUS | Status: DC | PRN

## 2016-04-05 MED ORDER — LACTATED RINGERS IV SOLN: INTRAVENOUS | Status: DC

## 2016-04-05 MED ORDER — CEFAZOLIN SODIUM-DEXTROSE 2-4 GM/100ML-% IV SOLN
2.0000 g | INTRAVENOUS | Status: AC
  Administered 2016-04-05: 2 g via INTRAVENOUS
  Filled 2016-04-05: qty 100

## 2016-04-05 MED ORDER — OXYTOCIN 10 UNIT/ML IJ SOLN
INTRAMUSCULAR | Status: AC
  Filled 2016-04-05: qty 4

## 2016-04-05 MED ORDER — MEASLES, MUMPS & RUBELLA VAC ~~LOC~~ INJ: 0.5000 mL | INJECTION | Freq: Once | SUBCUTANEOUS | Status: DC

## 2016-04-05 SURGICAL SUPPLY — 30 items
CHLORAPREP W/TINT 26ML (MISCELLANEOUS) ×2 IMPLANT
CLAMP CORD UMBIL (MISCELLANEOUS) IMPLANT
CLOTH BEACON ORANGE TIMEOUT ST (SAFETY) ×2 IMPLANT
DRSG OPSITE POSTOP 4X10 (GAUZE/BANDAGES/DRESSINGS) ×2 IMPLANT
ELECT REM PT RETURN 9FT ADLT (ELECTROSURGICAL) ×2
ELECTRODE REM PT RTRN 9FT ADLT (ELECTROSURGICAL) ×1 IMPLANT
EXTRACTOR VACUUM M CUP 4 TUBE (SUCTIONS) IMPLANT
GLOVE BIOGEL PI IND STRL 7.0 (GLOVE) ×3 IMPLANT
GLOVE BIOGEL PI INDICATOR 7.0 (GLOVE) ×3
GLOVE ECLIPSE 7.0 STRL STRAW (GLOVE) ×2 IMPLANT
GOWN STRL REUS W/TWL LRG LVL3 (GOWN DISPOSABLE) ×4 IMPLANT
KIT ABG SYR 3ML LUER SLIP (SYRINGE) IMPLANT
NEEDLE HYPO 22GX1.5 SAFETY (NEEDLE) ×2 IMPLANT
NEEDLE HYPO 25X5/8 SAFETYGLIDE (NEEDLE) ×2 IMPLANT
NS IRRIG 1000ML POUR BTL (IV SOLUTION) ×2 IMPLANT
PACK C SECTION WH (CUSTOM PROCEDURE TRAY) ×2 IMPLANT
PAD ABD 7.5X8 STRL (GAUZE/BANDAGES/DRESSINGS) ×2 IMPLANT
PAD OB MATERNITY 4.3X12.25 (PERSONAL CARE ITEMS) ×2 IMPLANT
PENCIL SMOKE EVAC W/HOLSTER (ELECTROSURGICAL) ×2 IMPLANT
RTRCTR C-SECT PINK 25CM LRG (MISCELLANEOUS) IMPLANT
SUT PDS AB 0 CTX 36 PDP370T (SUTURE) ×2 IMPLANT
SUT PLAIN 2 0 (SUTURE) ×1
SUT PLAIN 2 0 XLH (SUTURE) IMPLANT
SUT PLAIN ABS 2-0 CT1 27XMFL (SUTURE) ×1 IMPLANT
SUT VIC AB 0 CTX 36 (SUTURE) ×4
SUT VIC AB 0 CTX36XBRD ANBCTRL (SUTURE) ×4 IMPLANT
SUT VIC AB 4-0 KS 27 (SUTURE) ×2 IMPLANT
SYR CONTROL 10ML LL (SYRINGE) ×2 IMPLANT
TOWEL OR 17X24 6PK STRL BLUE (TOWEL DISPOSABLE) ×2 IMPLANT
TRAY FOLEY CATH SILVER 14FR (SET/KITS/TRAYS/PACK) ×2 IMPLANT

## 2016-04-05 NOTE — Anesthesia Postprocedure Evaluation (Signed)
Anesthesia Post Note  Patient: Anye Sage  Procedure(s) Performed: Procedure(s) (LRB): CESAREAN SECTION (N/A)  Patient location during evaluation: Mother Baby Anesthesia Type: Spinal Level of consciousness: awake and alert and oriented Pain management: satisfactory to patient Vital Signs Assessment: post-procedure vital signs reviewed and stable Respiratory status: spontaneous breathing and nonlabored ventilation Cardiovascular status: stable Postop Assessment: no headache, no backache, patient able to bend at knees, no signs of nausea or vomiting and adequate PO intake Anesthetic complications: no     Last Vitals:  Filed Vitals:   04/05/16 1445 04/05/16 1545  BP: 110/71 105/62  Pulse: 66 69  Temp: 36.7 C 36.8 C  Resp: 18 18    Last Pain:  Filed Vitals:   04/05/16 1608  PainSc: 4    Pain Goal: Patients Stated Pain Goal: 0 (04/05/16 0917)               Willa Rough

## 2016-04-05 NOTE — Patient Instructions (Signed)
[place]  04/05/2016   Your procedure is scheduled on:  04/08/2016  Enter through the Main Entrance of [facility] at [place] up the phone at the desk and dial [phone].   Call this number if you have problems the morning of surgery: [phone]   Remember:   Do not eat food:After Midnight.  Do not drink clear liquids: 4 Hours before arrival.  Take these medicines the morning of surgery with A SIP OF WATER: none.  DO NOT TAKE YOUR GLYBURIDE THE MORNING OF SURGERY   Do not wear jewelry, make-up or nail polish.  Do not wear lotions, powders, or perfumes. You may wear deodorant.  Do not shave 48 hours prior to surgery.  Do not bring valuables to the hospital.  [facility] is not   responsible for any belongings or valuables brought to the hospital.  Contacts, dentures or bridgework may not be worn into surgery.  Leave suitcase in the car. After surgery it may be brought to your room.  For patients admitted to the hospital, checkout time is 11:00 AM the day of              discharge.   Patients discharged the day of surgery will not be allowed to drive             home.  Name and phone number of your driver: NA  Special Instructions:   N/A   Please read over the following fact sheets that you were given:   Surgical Site Infection Prevention

## 2016-04-05 NOTE — Anesthesia Postprocedure Evaluation (Signed)
Anesthesia Post Note  Patient: Sylvia Best  Procedure(s) Performed: Procedure(s) (LRB): CESAREAN SECTION (N/A)  Patient location during evaluation: PACU Anesthesia Type: Spinal Level of consciousness: oriented and awake and alert Pain management: pain level controlled Vital Signs Assessment: post-procedure vital signs reviewed and stable Respiratory status: spontaneous breathing, respiratory function stable and patient connected to nasal cannula oxygen Cardiovascular status: blood pressure returned to baseline and stable Postop Assessment: no headache, no backache and spinal receding Anesthetic complications: no     Last Vitals:  Filed Vitals:   04/05/16 1300 04/05/16 1315  BP: 99/47 108/68  Pulse: 66 65  Temp: 36.4 C   Resp: 16 14    Last Pain:  Filed Vitals:   04/05/16 1320  PainSc: 4    Pain Goal: Patients Stated Pain Goal: 0 (04/05/16 0917)               Effie Berkshire

## 2016-04-05 NOTE — Lactation Note (Signed)
This note was copied from a baby's chart. Lactation Consultation Note  Patient Name: Sylvia Best [id number] Date: 04/05/2016 Reason for consult: Initial assessment  Initial visit at 7 hours of life. Mom is a P3 (twins w/the 1st pregnancy). She lactated for 7 months with her twins, while also offering formula. Per admission info, Mom's choice was to provide breastmilk. However, she would like to mix feed, just as she did with her twins.   Mom has my # to call for assist w/next feeding, if desired.  Matthias Hughs [facility] 04/05/2016, 5:52 PM

## 2016-04-05 NOTE — Anesthesia Preprocedure Evaluation (Addendum)
Anesthesia Evaluation  Patient identified by MRN, date of birth, ID band Patient awake    Reviewed: Allergy & Precautions, NPO status , Patient's Chart, lab work & pertinent test results  Airway Mallampati: II  TM Distance: >3 FB Neck ROM: Full    Dental  (+) Teeth Intact   Pulmonary asthma ,    breath sounds clear to auscultation       Cardiovascular negative cardio ROS   Rhythm:Regular Rate:Normal     Neuro/Psych negative neurological ROS  negative psych ROS   GI/Hepatic Neg liver ROS, GERD  ,  Endo/Other  diabetes, Gestational  Renal/GU negative Renal ROS  negative genitourinary   Musculoskeletal negative musculoskeletal ROS (+)   Abdominal   Peds negative pediatric ROS (+)  Hematology negative hematology ROS (+)   Anesthesia Other Findings   Reproductive/Obstetrics (+) Pregnancy                             Anesthesia Physical Anesthesia Plan  ASA: II  Anesthesia Plan: Spinal   Post-op Pain Management:    Induction:   Airway Management Planned: Natural Airway  Additional Equipment:   Intra-op Plan:   Post-operative Plan:   Informed Consent: I have reviewed the patients History and Physical, chart, labs and discussed the procedure including the risks, benefits and alternatives for the proposed anesthesia with the patient or authorized representative who has indicated his/her understanding and acceptance.     Plan Discussed with: CRNA  Anesthesia Plan Comments:         Anesthesia Quick Evaluation

## 2016-04-05 NOTE — MAU Note (Signed)
Pt c/o SROM at 0700 followed by contractions that are 5 minutes apart. Pt states baby is moving normally. Pt states her baby is breech and she has a c/section scheduled for Monday. Pt denies bleeding.

## 2016-04-05 NOTE — Anesthesia Procedure Notes (Signed)
Spinal Patient location during procedure: OR Start time: 04/05/2016 10:15 AM End time: 04/05/2016 10:17 AM Staffing Anesthesiologist: Suella Broad D Performed by: anesthesiologist  Preanesthetic Checklist Completed: patient identified, site marked, surgical consent, pre-op evaluation, timeout performed, IV checked, risks and benefits discussed and monitors and equipment checked Spinal Block Patient position: sitting Prep: Betadine Patient monitoring: heart rate, continuous pulse ox, blood pressure and cardiac monitor Approach: midline Location: L4-5 Injection technique: single-shot Needle Needle type: Whitacre and Introducer  Needle gauge: 24 G Needle length: 9 cm Additional Notes Negative paresthesia. Negative blood return. Positive free-flowing CSF. Expiration date of kit checked and confirmed. Patient tolerated procedure well, without complications.

## 2016-04-05 NOTE — H&P (Signed)
Obstetric Preoperative History and Physical  Sylvia Best is a 35 y.o. [id number] with IUP at 66w0dpresenting in labor with fetal breech presentation. Scheduled for RCS on 04/08/16.   No acute concerns.   [facility] complications or risks: Patient Active Problem List   Diagnosis Date Noted  . Unstable lie of fetus, antepartum 03/25/2016  . Velamentous insertion of umbilical cord, antepartum 12/31/2015  . Orthostatic dizziness 12/05/2015  . Umbilical pain 095/32/0233 . Language barrier, cultural differences 10/31/2015  . Supervision of high risk pregnancy in third trimester 10/03/2015  . Previous cesarean delivery affecting pregnancy, antepartum 10/03/2015  . GDM, class A2 10/03/2015  . AMA (advanced maternal age) multigravida 35+ 10/03/2015   She plans to breastfeed She desires IUD for postpartum contraception.   Prenatal labs and studies: ABO, Rh: B/POS/-- (01/17 1454) Antibody: NEG (01/17 1454) Rubella: 4.39 (01/17 1454) RPR: NON REAC (05/16 1159)  HBsAg: NEGATIVE (01/17 1454)  HIV: NONREACTIVE (05/16 1159)  GBS:  Genetic screening normal Anatomy [place]ormal   Past Medical History  Diagnosis Date  . GERD (gastroesophageal reflux disease)     no meds  . Diabetes mellitus     gestational  . Gestational diabetes   . Asthma     Past Surgical History  Procedure Laterality Date  . Tonsillectomy      age 89 . Cesarean section  04/26/2011    Procedure: CESAREAN SECTION;  Surgeon: TDonnamae Jude MD;  [facility];  Service: Gynecology;  Laterality: N/A;  . Dilation and curettage of uterus      OB History  Gravida Para Term Preterm AB SAB TAB Ectopic Multiple Living  _0 0 2 2 0 0 1 2    # Outcome Date GA Lbr Len/2nd Weight Sex Delivery Anes PTL Lv  4 Current           3A Term 04/26/11 353w0d5 lb 5 oz (2.41 kg) F CS-LTranv Spinal  Y  3B Term 04/26/11 3839w0d lb 5 oz (2.41 kg) F CS-LTranv EPI  Y  2 SAB 02/2010 10w66w0d      Comments: [facility]C needed[facility] 10w048w0d       [place] Social History   Social History  . Marital Status: Married    Spouse Name: N/A  . Number of Children: N/A  . Years of Education: N/A   Social History Main Topics  . Smoking status: Never Smoker   . Smokeless tobacco: Never Used  . Alcohol Use: No  . Drug Use: No  . Sexual Activity: Yes    Birth Control/ Protection: None   Other Topics Concern  . None   Social History Narrative    Family History  Problem Relation Age of Onset  . Hypertension Mother   . Heart disease Father   . Diabetes Father   . Diabetes Paternal Grandfather     Prescriptions prior to admission  Medication Sig Dispense Refill Last Dose  . ACCU-CHEK FASTCLIX LANCETS MISC 1 Units by Percutaneous route 4 (four) times daily. 100 each 12 Taking  . acetaminophen (TYLENOL) 500 MG tablet Take 500 mg by mouth every 6 (six) hours as needed for mild pain. Reported on 11/14/2015   Taking  . Blood Glucose Monitoring Suppl (ACCU-CHEK AVIVA PLUS) w/Device KIT 1 Device by Does  not apply route once. 1 kit 0 Taking  . ferrous sulfate (FERROUSUL) 325 (65 FE) MG tablet Take 1 tablet (325 mg total) by mouth once. (Patient not taking: Reported on 03/25/2016) 60 tablet 1 Not Taking  . glucose blood (ACCU-CHEK AVIVA PLUS) test strip Use as instructed 100 each 12 Taking  . glyBURIDE (DIABETA) 2.5 MG tablet Take 0.5 tablet by mouth with breakfast and at bedtime (Patient taking differently: Take 1.25 mg by mouth 2 (two) times daily. Take 0.5 tablet by mouth with breakfast and at bedtime) 30 tablet 3 Taking  . Prenatal Vit-Fe Fumarate-FA (NAT-RUL PRENATAL VITAMINS) 28-0.8 MG TABS Take 1 tablet by mouth daily. 30 tablet 12 Taking    No Known Allergies  Review of Systems: Negative except for what is mentioned in HPI.  Physical Exam: BP 117/86 mmHg  Pulse 86  Ht 5' 2.9" (1.598 m)  Wt 156 lb (70.761 kg)  BMI 27.71 kg/m2  SpO2  100%  LMP 07/07/2015 FHR by Doppler: 155 bpm CONSTITUTIONAL: Well-developed, well-nourished female in no acute distress.  HENT:  Normocephalic, atraumatic, External right and left ear normal. Oropharynx is clear and moist EYES: Conjunctivae and EOM are normal. Pupils are equal, round, and reactive to light. No scleral icterus.  NECK: Normal range of motion, supple, no masses SKIN: Skin is warm and dry. No rash noted. Not diaphoretic. No erythema. No pallor. [place]: Alert and oriented to person, place, and time. Normal reflexes, muscle tone coordination. No cranial nerve deficit noted. PSYCHIATRIC: Normal mood and affect. Normal behavior. Normal judgment and thought content. CARDIOVASCULAR: Normal heart rate noted, regular rhythm RESPIRATORY: Effort and breath sounds normal, no problems with respiration noted ABDOMEN: Soft, nontender, nondistended, gravid. Well-healed Pfannenstiel incision. PELVIC: Deferred MUSCULOSKELETAL: Normal range of motion. No edema and no tenderness. 2+ distal pulses.  Pertinent Labs/Studies:   No results found for this or any previous visit (from the past 72 hour(s)).  Assessment and Plan :Sylvia Best is a 35 y.o. [id number] at 87w0dbeing admitted for repeat cesarean section. The risks of cesarean section discussed with the patient included but were not limited to: bleeding which may require transfusion or reoperation; infection which may require antibiotics; injury to bowel, bladder, ureters or other surrounding organs; injury to the fetus; need for additional procedures including hysterectomy in the event of a life-threatening hemorrhage; placental abnormalities wth subsequent pregnancies, incisional problems, thromboembolic phenomenon and other postoperative/anesthesia complications. The patient concurred with the proposed plan, giving informed written consent for the procedure. Patient has been NPO since last night she will remain NPO for procedure. Anesthesia and OR  aware. Preoperative prophylactic antibiotics and SCDs ordered on call to the OR. To OR when ready.    UVerita Schneiders MD, FI[place] Attending [facility]

## 2016-04-05 NOTE — Op Note (Signed)
Cesarean Section Operative Report  Sylvia Best  04/05/2016  Indications: Breech Presentation, active labor  Pre-operative Diagnosis: Repeat Csection for breech presentation.   Post-operative Diagnosis: Same   Surgeon: Surgeon(s) and Role:    * Osborne Oman, MD - Primary       Jacquiline Doe MD  Assistants: none  Anesthesia: spinal    Estimated Blood Loss: 700 ml  Total IV Fluids: 2600 ml LR  Urine Output:: 700 ml yellow urine  Specimens: none  Findings: Viable female infant in breech presentation; Apgars 9 and 9, intact placenta with three vessel cord; normal uterus, fallopian tubes and ovaries bilaterally.  Baby condition / location:  Couplet care / Skin to Skin   Complications: no complications  Indications: Sylvia Best is a 35 y.o. [id number] with an IUP at [redacted]w[redacted]d presenting with previous c-section, breech presentation in active labor.  The risks, benefits, complications, treatment options, and expected outcomes were discussed with the patient . The patient with the proposed plan, giving informed consent. She was identified as Sylvia Best and the procedure verified as C-Section Delivery.  Procedure Details:  The patient was taken back to the operative suite where spinal anesthesia was placed.  A time out was held and the above information confirmed.   After induction of anesthesia, the patient was draped and prepped in the usual sterile manner and placed in a dorsal supine position with a leftward tilt. A Pfannenstiel incision was made and carried down through the subcutaneous tissue to the fascia. Fascial incision was made and extended transversely with the bovie. The fascia was separated from the underlying rectus tissue superiorly and inferiorly. The peritoneum was identified and bluntly entered and extended longitudinally. Alexis retractor was placed. A low transverse uterine incision was made and extended bluntly. Delivered from breech presentation was a viable  infant.  After waiting 60 seconds for delayed cord cutting, the umbilical cord was clamped and cut cord blood was obtained for evaluation. The placenta was manually removed Intact and appeared normal. The uterine outline, tubes and ovaries appeared normal. The uterine incision was closed with running locked sutures of 0 Vicryl with an imbricating layer of the same.   Hemostasis was observed. The peritoneum was closed with 0 vicryl. The rectus muscles were examined and hemostasis observed. The fascia was then reapproximated with running sutures of 0 PDS. A total of 30 ml 0.5% Marcaine was injected subcutaneously at the margins of the incision. The subcutaneous tissues were reapproximated with 2-0 plain gut interrupted stitches. The subcuticular closure was performed using 2-0 Vicryl. The skin was closed with 4-0 Vicryl.   Instrument, sponge, and needle counts were correct prior the abdominal closure and were correct at the conclusion of the case.   Disposition: PACU - hemodynamically stable.    Signed: Brayton Mars  04/05/2016 11:32 AM      Attestation of Attending Supervision of Obstetric Fellow During Surgery: Surgery was performed with the Obstetric Fellow under my supervision and collaboration.  I was present and scrubbed for the entire procedure.   I have reviewed the Obstetric Fellow's operative report, and I agree with the documentation.   Verita Schneiders, MD, [place] Attending [place], [facility]

## 2016-04-05 NOTE — Addendum Note (Signed)
Addendum  created 04/05/16 1613 by Jonna Munro, CRNA   Modules edited: Clinical Notes   Clinical Notes:  File: [id number]

## 2016-04-05 NOTE — Transfer of Care (Signed)
Immediate Anesthesia Transfer of Care Note  Patient: Isha Watterson  Procedure(s) Performed: Procedure(s): CESAREAN SECTION (N/A)  Patient Location: PACU  Anesthesia Type:Spinal  Level of Consciousness: awake, alert , oriented and patient cooperative  Airway & Oxygen Therapy: Patient Spontanous Breathing  Post-op Assessment: Report given to RN and Post -op Vital signs reviewed and stable  Post vital signs: Reviewed and stable  Last Vitals:  Filed Vitals:   04/05/16 0910 04/05/16 0952  BP: 117/86 121/82  Pulse: 86 91  Resp:  20    Last Pain:  Filed Vitals:   04/05/16 1147  PainSc: 4       Patients Stated Pain Goal: 0 ([id number])  Complications: No apparent anesthesia complications

## 2016-04-05 NOTE — MAU Note (Signed)
Earrings inside pts purse.

## 2016-04-06 ENCOUNTER — Encounter (HOSPITAL_COMMUNITY): Payer: Self-pay | Admitting: Obstetrics & Gynecology

## 2016-04-06 LAB — CBC
HCT: 27.4 % — ABNORMAL LOW (ref 36.0–46.0)
Hemoglobin: 9.4 g/dL — ABNORMAL LOW (ref 12.0–15.0)
MCH: 29.7 pg (ref 26.0–34.0)
MCHC: 34.3 g/dL (ref 30.0–36.0)
MCV: 86.4 fL (ref 78.0–100.0)
PLATELETS: 190 10*3/uL (ref 150–400)
RBC: 3.17 MIL/uL — ABNORMAL LOW (ref 3.87–5.11)
RDW: 13.2 % (ref 11.5–15.5)
WBC: 9.8 10*3/uL (ref 4.0–10.5)

## 2016-04-06 LAB — GLUCOSE, RANDOM: GLUCOSE: 106 mg/dL — AB (ref 65–99)

## 2016-04-06 LAB — RPR: RPR: NONREACTIVE

## 2016-04-06 LAB — GLUCOSE, CAPILLARY: GLUCOSE-CAPILLARY: 98 mg/dL (ref 65–99)

## 2016-04-06 MED ORDER — BISACODYL 10 MG RE SUPP
10.0000 mg | Freq: Once | RECTAL | Status: AC
  Administered 2016-04-06: 10 mg via RECTAL
  Filled 2016-04-06: qty 1

## 2016-04-06 NOTE — Progress Notes (Signed)
Post Partum Day 1  Subjective:  Sylvia Best is a 35 y.o. [id number] [redacted]w[redacted]d s/p RLTCS.  No acute events overnight.  Pt denies problems with ambulating, voiding or po intake.  She denies nausea or vomiting.  Pain is well controlled.  She has had flatus. She has not had bowel movement.  Lochia Small.  Plan for birth control is IUD.  Method of Feeding: breast  Objective: BP 106/63 mmHg  Pulse 73  Temp(Src) 98.8 F (37.1 C) (Oral)  Resp 16  Ht 5' 2.9" (1.598 m)  Wt 70.761 kg (156 lb)  BMI 27.71 kg/m2  SpO2 98%  LMP 07/07/2015  Breastfeeding? Unknown  Physical Exam:  General: alert, cooperative and no distress Lochia:normal flow Chest: CTAB Heart: RRR no m/r/g Abdomen: +BS, soft, nontender, fundus firm at/below umbilicus. PICO in place. Uterine Fundus: firm, nontender DVT Evaluation: No evidence of DVT seen on physical exam. Extremities: no edema   Recent Labs  04/05/16 0930 04/06/16 0537  HGB 11.1* 9.4*  HCT 31.9* 27.4*    Assessment/Plan:  ASSESSMENT: Sylvia Best is a 35 y.o. [id number] [redacted]w[redacted]d ppd #1 s/p RLTCS doing well.   Plan for discharge tomorrow and Breastfeeding   LOS: 1 day   Ralene Ok 04/06/2016, 7:47 AM

## 2016-04-07 NOTE — Progress Notes (Signed)
Subjective: Postpartum Day 2: Cesarean Delivery Patient reports incisional pain, tolerating PO, + flatus and no problems voiding.    Objective: Vital signs in last 24 hours: Temp:  [98.3 F (36.8 C)-98.5 F (36.9 C)] 98.3 F (36.8 C) (07/23 0506) Pulse Rate:  [78-90] 78 (07/23 0506) Resp:  [16-18] 18 (07/23 0506) BP: (110-116)/(64-78) 111/77 (07/23 0506) SpO2:  [99 %] 99 % (07/22 1540)  Physical Exam:  General: alert, cooperative, appears stated age and no distress Lochia: appropriate Uterine Fundus: firm Incision: healing well, no significant drainage, no dehiscence, no significant erythema DVT Evaluation: No evidence of DVT seen on physical exam.   Recent Labs  04/05/16 0930 04/06/16 0537  HGB 11.1* 9.4*  HCT 31.9* 27.4*    Assessment/Plan: Status post Cesarean section. Doing well postoperatively.  Continue current care.  Koren Shiver 04/07/2016, 7:33 AM

## 2016-04-08 ENCOUNTER — Inpatient Hospital Stay (HOSPITAL_COMMUNITY): Admission: RE | Admit: 2016-04-08 | Payer: Medicaid Other | Source: Ambulatory Visit | Admitting: Family Medicine

## 2016-04-08 ENCOUNTER — Encounter (HOSPITAL_COMMUNITY): Admission: RE | Payer: Self-pay | Source: Ambulatory Visit

## 2016-04-08 SURGERY — Surgical Case
Anesthesia: Regional

## 2016-04-08 MED ORDER — OXYCODONE-ACETAMINOPHEN 5-325 MG PO TABS: 1.0000 | ORAL_TABLET | ORAL | 0 refills | Status: DC | PRN

## 2016-04-08 MED ORDER — IBUPROFEN 600 MG PO TABS: 600.0000 mg | ORAL_TABLET | Freq: Four times a day (QID) | ORAL | 0 refills | Status: DC

## 2016-04-08 NOTE — Lactation Note (Signed)
This note was copied from a baby's chart. Lactation Consultation Note: Experienced BF mom has baby latched to breast when I went into room. Reports some nipple pain with latch. Reviewed wide open mouth and keeping the baby close to the breast throughout feedings. Reports breasts are feeling much heavier this morning. Reviewed engorgement prevention and treatment. Encouraged to stop giving bottles of formula and only breast feed since she is making more milk. Manual pump given with #27 flange. Reviewed setup, use and cleaning of pump pieces. Mom pumped 30 ml from right breast and reports that feels much better. No questions at present. To call prn  Patient Name: Boy Tanita Cann [id number] Date: 04/08/2016 Reason for consult: Follow-up assessment   Maternal Data Formula Feeding for Exclusion: No Has patient been taught Hand Expression?: Yes Does the patient have breastfeeding experience prior to this delivery?: Yes  Feeding Feeding Type: Breast Fed Length of feed: 15 min  LATCH Score/Interventions Latch: Grasps breast easily, tongue down, lips flanged, rhythmical sucking.  Audible Swallowing: A few with stimulation  Type of Nipple: Everted at rest and after stimulation  Comfort (Breast/Nipple): Filling, red/small blisters or bruises, mild/mod discomfort  Problem noted: Mild/Moderate discomfort Interventions (Mild/moderate discomfort): Hand expression  Hold (Positioning): No assistance needed to correctly position infant at breast.  LATCH Score: 8  Lactation Tools Discussed/Used Tools: Pump Breast pump type: Manual WIC Program: Yes Pump Review: Setup, frequency, and cleaning;Milk Storage Initiated by:: DW Date initiated:: 04/08/16   Consult Status Consult Status: Complete    Truddie Crumble 04/08/2016, 8:16 AM

## 2016-04-14 NOTE — Discharge Summary (Signed)
OB Discharge Summary  Patient Name: Sylvia Best DOB: 1981/05/02 MRN: [id number]  Date of admission: 04/05/2016 Delivering MD: Verita Schneiders A   Date of discharge: 04/14/2016  Admitting diagnosis: 39WKS, WATER BROKE Intrauterine pregnancy: [redacted]w[redacted]d     Secondary diagnosis:Active Problems:   S/P cesarean section  Additional problems:none     Discharge diagnosis: Term Pregnancy Delivered                                                                     Post partum procedures:none  Augmentation: none  Complications: None  Hospital course:  Onset of Labor With Unplanned C/S  35 y.o. yo [id number] at [redacted]w[redacted]d was admitted in Latent Labor on 04/05/2016. Patient had a labor course significant for breech. Membrane Rupture Time/Date: 7:00 AM ,04/05/2016   The patient went for cesarean section due to Malpresentation, and delivered a Viable infant,04/05/2016  Details of operation can be found in separate operative note. Patient had an uncomplicated postpartum course.  She is ambulating,tolerating a regular diet, passing flatus, and urinating well.  Patient is discharged home in stable condition 04/14/16.   Physical exam Vitals:   04/06/16 1832 04/07/16 0506 04/07/16 1845 04/08/16 0556  BP: 116/78 111/77 123/79 111/67  Pulse: 90 78 93 84  Resp: 16 18 18 18   Temp: 98.5 F (36.9 C) 98.3 F (36.8 C) 97.5 F (36.4 C) 97.9 F (36.6 C)  TempSrc: Oral  Oral   SpO2:   100%   Weight:      Height:       General: alert, cooperative and no distress Lochia: appropriate Uterine Fundus: firm Incision: Healing well with no significant drainage DVT Evaluation: No evidence of DVT seen on physical exam. Labs: Lab Results  Component Value Date   WBC 9.8 04/06/2016   HGB 9.4 (L) 04/06/2016   HCT 27.4 (L) 04/06/2016   MCV 86.4 04/06/2016   PLT 190 04/06/2016   CMP Latest Ref Rng & Units 04/06/2016  Glucose 65 - 99 mg/dL 106(H)  BUN 6 - 20 mg/dL -  Creatinine 0.44 - 1.00 mg/dL -  Sodium 135 -  145 mmol/L -  Potassium 3.5 - 5.1 mmol/L -  Chloride 101 - 111 mmol/L -  CO2 22 - 32 mmol/L -  Calcium 8.9 - 10.3 mg/dL -  Total Protein 6.5 - 8.1 g/dL -  Total Bilirubin 0.3 - 1.2 mg/dL -  Alkaline Phos 38 - 126 U/L -  AST 15 - 41 U/L -  ALT 14 - 54 U/L -    Discharge instruction: per After Visit Summary and "Baby and Me Booklet".  After Visit Meds:    Medication List    STOP taking these medications   acetaminophen 500 MG tablet Commonly known as:  TYLENOL     TAKE these medications   ferrous sulfate 325 (65 FE) MG tablet Commonly known as:  FERROUSUL Take 1 tablet (325 mg total) by mouth once.   ibuprofen 600 MG tablet Commonly known as:  ADVIL,MOTRIN Take 1 tablet (600 mg total) by mouth every 6 (six) hours.   NAT-RUL PRENATAL VITAMINS 28-0.8 MG Tabs Take 1 tablet by mouth daily.   oxyCODONE-acetaminophen 5-325 MG tablet Commonly known as:  PERCOCET/ROXICET Take 1 tablet  by mouth every 4 (four) hours as needed (pain scale 4-7).       Diet: routine diet  Activity: Advance as tolerated. Pelvic rest for 6 weeks.   Outpatient follow up:6 weeks Follow up Appt:Future Appointments Date Time Provider [employer]  05/10/2016 10:00 AM Lavonia Drafts, MD [place] [facility]   Follow up visit: No Follow-up on file.  Postpartum contraception: Undecided  Newborn Data: Live born female  Birth Weight: 5 lb 8.2 oz (2500 g) APGAR: 9, 9  Baby Feeding: Breast Disposition:home with mother   04/14/2016 Koren Shiver, CNM

## 2016-04-24 ENCOUNTER — Ambulatory Visit: Payer: Medicaid Other | Admitting: Obstetrics & Gynecology

## 2016-04-24 VITALS — BP 107/69 | HR 77 | Temp 98.8°F | Wt 143.2 lb

## 2016-04-24 DIAGNOSIS — Z9889 Other specified postprocedural states: Secondary | ICD-10-CM

## 2016-04-24 MED ORDER — IBUPROFEN 800 MG PO TABS: 800.0000 mg | ORAL_TABLET | Freq: Three times a day (TID) | ORAL | 1 refills | Status: DC | PRN

## 2016-04-24 NOTE — Progress Notes (Signed)
   Subjective:    Patient ID: Sylvia Best, female    DOB: [birth year], 35 y.o.   MRN: [id number]  HPI 35 yo P2 here about 2 weeks after a RLTCS with the complaint of incision pain and nipple pain. She has used all of her percocet and is taking IUB 600mg . She denies fevers. She is breastfeeding.   Review of Systems     Objective:   Physical Exam [id number] Abd- She says that she has pain even when I touch her skin around her umbilicus. Her incision has healed beautifully. Her uterus is firm at S+2 I see no evidence for any infection or reason for her skin pain. Her nipples appear completely normal as well       Assessment & Plan:  Skin/abdominal pain- I will increase her IBU to 800 mg RTC for 6 week pp visit

## 2016-04-24 NOTE — Progress Notes (Signed)
Had C/S 04/05/16. C/o a lot of pain at incision. Incision appears clean, dry and intact. Also c/o nipple pain.

## 2016-05-10 ENCOUNTER — Encounter: Payer: Self-pay | Admitting: Obstetrics & Gynecology

## 2016-05-10 ENCOUNTER — Ambulatory Visit (INDEPENDENT_AMBULATORY_CARE_PROVIDER_SITE_OTHER): Payer: Medicaid Other | Admitting: Obstetrics & Gynecology

## 2016-05-10 DIAGNOSIS — Z3009 Encounter for other general counseling and advice on contraception: Secondary | ICD-10-CM

## 2016-05-10 MED ORDER — NORGESTIMATE-ETH ESTRADIOL 0.25-35 MG-MCG PO TABS: 1.0000 | ORAL_TABLET | Freq: Every day | ORAL | 11 refills | Status: DC

## 2016-05-10 NOTE — Progress Notes (Signed)
Subjective:     Sylvia Best is a 35 y.o. female who presents for a postpartum visit. She is 5 weeks postpartum following a low cervical transverse Cesarean section. I have fully reviewed the prenatal and intrapartum course. The delivery was at 69 gestational weeks. Outcome: repeat cesarean section, low transverse incision. Anesthesia: spinal. Postpartum course has been remarkable for continued pain and small amount of thin drainage from the right side of her incision site. No fever. Baby's course has been unremarkable. Baby is feeding by breast. Bleeding no bleeding. Bowel function is normal. Bladder function is abnormal: constipation. Patient is not sexually active. Contraception method is none. Postpartum depression screening: negative.  "[name]" [phone], Arabic video interpreter assisted with visit  The following portions of the patient's history were reviewed and updated as appropriate: allergies, current medications, past family history, past medical history, past social history, past surgical history and problem list.  Review of Systems Pertinent items are noted in HPI.   Objective:   BP 114/71   Pulse 86   Wt 143 lb 3.2 oz (65 kg)   BMI 25.45 kg/m    Lungs: CTA CV: RRR Abd: soft, NT, ND. Incision C/D/I  Assessment:     5 weeks postpartum exam. Pap smear not done at today's visit.   Constipation Oral lesion/ulcer Contraception counseling- pt opts for OCPs   Plan:    1. Contraception: OCP (estrogen/progesterone) 2. Miralax prn 3. Follow up in: 1 year or as needed.   4. Oragel prn  Tremond Shimabukuro L. Harraway-Smith, M.D., Cherlynn June

## 2016-05-10 NOTE — Patient Instructions (Signed)
Oral Contraception Use Oral contraceptive pills (OCPs) are medicines taken to prevent pregnancy. OCPs work by preventing the ovaries from releasing eggs. The hormones in OCPs also cause the cervical mucus to thicken, preventing the sperm from entering the uterus. The hormones also cause the uterine lining to become thin, not allowing a fertilized egg to attach to the inside of the uterus. OCPs are highly effective when taken exactly as prescribed. However, OCPs do not prevent sexually transmitted diseases (STDs). Safe sex practices, such as using condoms along with an OCP, can help prevent STDs. Before taking OCPs, you may have a physical exam and Pap test. Your health care provider may also order blood tests if necessary. Your health care provider will make sure you are a good candidate for oral contraception. Discuss with your health care provider the possible side effects of the OCP you may be prescribed. When starting an OCP, it can take 2 to 3 months for the body to adjust to the changes in hormone levels in your body.  HOW TO TAKE ORAL CONTRACEPTIVE PILLS Your health care provider may advise you on how to start taking the first cycle of OCPs. Otherwise, you can:   Start on day 1 of your menstrual period. You will not need any backup contraceptive protection with this start time.   Start on the first Sunday after your menstrual period or the day you get your prescription. In these cases, you will need to use backup contraceptive protection for the first week.   Start the pill at any time of your cycle. If you take the pill within 5 days of the start of your period, you are protected against pregnancy right away. In this case, you will not need a backup form of birth control. If you start at any other time of your menstrual cycle, you will need to use another form of birth control for 7 days. If your OCP is the type called a minipill, it will protect you from pregnancy after taking it for 2 days (48  hours). After you have started taking OCPs:   If you forget to take 1 pill, take it as soon as you remember. Take the next pill at the regular time.   If you miss 2 or more pills, call your health care provider because different pills have different instructions for missed doses. Use backup birth control until your next menstrual period starts.   If you use a 28-day pack that contains inactive pills and you miss 1 of the last 7 pills (pills with no hormones), it will not matter. Throw away the rest of the non-hormone pills and start a new pill pack.  No matter which day you start the OCP, you will always start a new pack on that same day of the week. Have an extra pack of OCPs and a backup contraceptive method available in case you miss some pills or lose your OCP pack.  HOME CARE INSTRUCTIONS   Do not smoke.   Always use a condom to protect against STDs. OCPs do not protect against STDs.   Use a calendar to mark your menstrual period days.   Read the information and directions that came with your OCP. Talk to your health care provider if you have questions.  SEEK MEDICAL CARE IF:   You develop nausea and vomiting.   You have abnormal vaginal discharge or bleeding.   You develop a rash.   You miss your menstrual period.   You are losing   your hair.   You need treatment for mood swings or depression.   You get dizzy when taking the OCP.   You develop acne from taking the OCP.   You become pregnant.  SEEK IMMEDIATE MEDICAL CARE IF:   You develop chest pain.   You develop shortness of breath.   You have an uncontrolled or severe headache.   You develop numbness or slurred speech.   You develop visual problems.   You develop pain, redness, and swelling in the legs.    This information is not intended to replace advice given to you by your health care provider. Make sure you discuss any questions you have with your health care provider.   Document  Released: 08/22/2011 Document Revised: 09/23/2014 Document Reviewed: 02/21/2013 Elsevier Interactive Patient Education 2016 Elsevier Inc.  

## 2016-05-14 ENCOUNTER — Other Ambulatory Visit: Payer: Medicaid Other

## 2016-05-14 DIAGNOSIS — O24919 Unspecified diabetes mellitus in pregnancy, unspecified trimester: Secondary | ICD-10-CM

## 2016-05-15 ENCOUNTER — Telehealth: Payer: Self-pay

## 2016-05-15 ENCOUNTER — Encounter: Payer: Self-pay | Admitting: Advanced Practice Midwife

## 2016-05-15 LAB — GLUCOSE TOLERANCE, 2 HOURS
GLUCOSE, 2 HOUR: 109 mg/dL (ref <140)
GLUCOSE, FASTING: 80 mg/dL (ref 65–99)

## 2016-05-15 NOTE — Telephone Encounter (Signed)
Patient has been informed of normal test results.

## 2016-06-05 ENCOUNTER — Encounter: Payer: Medicaid Other | Admitting: Family Medicine

## 2016-06-19 ENCOUNTER — Telehealth: Payer: Self-pay | Admitting: *Deleted

## 2016-06-19 NOTE — Telephone Encounter (Signed)
Called pt and left message stating that we have received a refill request from [facility] for Ibuprofen 800 mg #60- last refilled on 05/13/16. We need to know what type of pain you are having. Per chart review, pt had C/S on 04/05/16 and PP visit on 05/10/16.

## 2016-10-09 ENCOUNTER — Encounter (HOSPITAL_COMMUNITY): Payer: Self-pay | Admitting: Emergency Medicine

## 2016-10-09 ENCOUNTER — Emergency Department (HOSPITAL_COMMUNITY)
Admission: EM | Admit: 2016-10-09 | Discharge: 2016-10-09 | Discharge status: Against Medical Advice | Payer: Medicaid Other | Attending: Emergency Medicine | Admitting: Emergency Medicine

## 2016-10-09 DIAGNOSIS — J45909 Unspecified asthma, uncomplicated: Secondary | ICD-10-CM | POA: Insufficient documentation

## 2016-10-09 DIAGNOSIS — R509 Fever, unspecified: Secondary | ICD-10-CM

## 2016-10-09 DIAGNOSIS — R109 Unspecified abdominal pain: Secondary | ICD-10-CM

## 2016-10-09 DIAGNOSIS — E119 Type 2 diabetes mellitus without complications: Secondary | ICD-10-CM | POA: Insufficient documentation

## 2016-10-09 DIAGNOSIS — R10A0 Flank pain, unspecified side: Secondary | ICD-10-CM

## 2016-10-09 LAB — COMPREHENSIVE METABOLIC PANEL
ALK PHOS: 56 U/L (ref 38–126)
ALT: 37 U/L (ref 14–54)
ANION GAP: 8 (ref 5–15)
AST: 30 U/L (ref 15–41)
Albumin: 4.6 g/dL (ref 3.5–5.0)
BUN: 9 mg/dL (ref 6–20)
CALCIUM: 9.1 mg/dL (ref 8.9–10.3)
CO2: 24 mmol/L (ref 22–32)
Chloride: 102 mmol/L (ref 101–111)
Creatinine, Ser: 0.59 mg/dL (ref 0.44–1.00)
GFR calc non Af Amer: >60 mL/min (ref >60)
Glucose, Bld: 96 mg/dL (ref 65–99)
POTASSIUM: 3.2 mmol/L — AB (ref 3.5–5.1)
SODIUM: 134 mmol/L — AB (ref 135–145)
Total Bilirubin: 0.7 mg/dL (ref 0.3–1.2)
Total Protein: 7.8 g/dL (ref 6.5–8.1)

## 2016-10-09 LAB — CBC
HEMATOCRIT: 37.8 % (ref 36.0–46.0)
HEMOGLOBIN: 13.3 g/dL (ref 12.0–15.0)
MCH: 31.3 pg (ref 26.0–34.0)
MCHC: 35.2 g/dL (ref 30.0–36.0)
MCV: 88.9 fL (ref 78.0–100.0)
Platelets: 177 10*3/uL (ref 150–400)
RBC: 4.25 MIL/uL (ref 3.87–5.11)
RDW: 12.7 % (ref 11.5–15.5)
WBC: 5.2 10*3/uL (ref 4.0–10.5)

## 2016-10-09 LAB — I-STAT CG4 LACTIC ACID, ED: Lactic Acid, Venous: 0.72 mmol/L (ref 0.5–1.9)

## 2016-10-09 LAB — I-STAT BETA HCG BLOOD, ED (MC, WL, AP ONLY)

## 2016-10-09 LAB — LIPASE, BLOOD: Lipase: 22 U/L (ref 11–51)

## 2016-10-09 MED ORDER — ACETAMINOPHEN 325 MG PO TABS
650.0000 mg | ORAL_TABLET | Freq: Once | ORAL | Status: AC | PRN
  Administered 2016-10-09: 650 mg via ORAL
  Filled 2016-10-09: qty 2

## 2016-10-09 NOTE — ED Provider Notes (Signed)
[facility] DEPT Provider Note   CSN: [id number] Arrival date & time: 10/09/16  1544     History   Chief Complaint Chief Complaint  Patient presents with  . Fever  . Abdominal Pain    HPI Sylvia Best is a 36 y.o. female.  HPI  Patient resents with fever chills and left flank pain. Has had headache and some nasal drainage. No sore throat. States it hurts in her right flank. No diarrhea. No nausea or vomiting. No sick contacts. No vaginal bleeding or discharge.  Past Medical History:  Diagnosis Date  . Asthma   . Diabetes mellitus    gestational  . GERD (gastroesophageal reflux disease)    no meds  . Gestational diabetes     Patient Active Problem List   Diagnosis Date Noted  . S/P cesarean section 04/05/2016  . Orthostatic dizziness 12/05/2015  . Umbilical pain [id number]  . Language barrier, cultural differences 10/31/2015  . History of gestational diabetes 10/03/2015  . AMA (advanced maternal age) multigravida 35+ 10/03/2015    Past Surgical History:  Procedure Laterality Date  . CESAREAN SECTION  04/26/2011   Procedure: CESAREAN SECTION;  Surgeon: Donnamae Jude, MD;  Location: [facility];  Service: Gynecology;  Laterality: N/A;  . CESAREAN SECTION N/A 04/05/2016   Procedure: CESAREAN SECTION;  Surgeon: Osborne Oman, MD;  Location: [facility];  Service: Obstetrics;  Laterality: N/A;  . DILATION AND CURETTAGE OF UTERUS    . TONSILLECTOMY     age 79    OB History    Gravida Para Term Preterm AB Living   4 2 2  0 2 3   SAB TAB Ectopic Multiple Live Births   2 0 0 1 3       Home Medications    Prior to Admission medications   Medication Sig Start Date End Date Taking? Authorizing Provider  etonogestrel (NEXPLANON) 68 MG IMPL implant 1 each by Subdermal route continuous.   Yes Historical Provider, MD  ibuprofen (ADVIL,MOTRIN) 200 MG tablet Take 600 mg by mouth every 6 (six) hours as needed for headache.   Yes Historical Provider, MD  ferrous  sulfate (FERROUSUL) 325 (65 FE) MG tablet Take 1 tablet (325 mg total) by mouth once. Patient not taking: Reported on 10/09/2016 12/05/15   Deirdre C Poe, CNM  ibuprofen (ADVIL,MOTRIN) 800 MG tablet Take 1 tablet (800 mg total) by mouth every 8 (eight) hours as needed. Patient not taking: Reported on 10/09/2016 04/24/16   Emily Filbert, MD  norgestimate-ethinyl estradiol (ORTHO-CYCLEN,SPRINTEC,PREVIFEM) 0.25-35 MG-MCG tablet Take 1 tablet by mouth daily. Patient not taking: Reported on 10/09/2016 05/10/16   Lavonia Drafts, MD  oxyCODONE-acetaminophen (PERCOCET/ROXICET) 5-325 MG tablet Take 1 tablet by mouth every 4 (four) hours as needed (pain scale 4-7). Patient not taking: Reported on 04/24/2016 04/08/16   Keitha Butte, CNM  Prenatal Vit-Fe Fumarate-FA (NAT-RUL PRENATAL VITAMINS) 28-0.8 MG TABS Take 1 tablet by mouth daily. Patient not taking: Reported on 10/09/2016 11/28/15   Seabron Spates, CNM    Family History Family History  Problem Relation Age of Onset  . Hypertension Mother   . Heart disease Father   . Diabetes Father   . Diabetes Paternal Grandfather     Social History Social History  Substance Use Topics  . Smoking status: Never Smoker  . Smokeless tobacco: Never Used  . Alcohol use No     Allergies   Patient has no known allergies.   Review of Systems Review  of Systems  Constitutional: Positive for chills and fever.  HENT: Positive for congestion. Negative for nosebleeds.   Eyes: Negative for photophobia.  Respiratory: Negative for shortness of breath.   Cardiovascular: Negative for chest pain.  Gastrointestinal: Negative for abdominal pain.  Genitourinary: Positive for flank pain. Negative for dysuria.  Musculoskeletal: Positive for back pain and myalgias.  Neurological: Positive for headaches. Negative for weakness.  Hematological: Negative for adenopathy.     Physical Exam Updated Vital Signs BP 111/80 (BP Location: Left Arm)   Pulse 117   Temp 100.9  F (38.3 C) (Oral)   Resp 18   Ht 5\' 2"  (1.575 m)   Wt 143 lb (64.9 kg)   SpO2 97%   BMI 26.16 kg/m   Physical Exam  Constitutional: She appears well-developed.  HENT:  Head: Atraumatic.  Eyes: EOM are normal.  Neck: Neck supple.  Cardiovascular:  Tachycardia  Pulmonary/Chest: Effort normal.  Abdominal: Soft. There is no tenderness.  Genitourinary:  Genitourinary Comments: Right CVA tenderness  Musculoskeletal: She exhibits no edema.  Skin: Skin is warm. Capillary refill takes less than 2 seconds.     ED Treatments / Results  Labs (all labs ordered are listed, but only abnormal results are displayed) Labs Reviewed  COMPREHENSIVE METABOLIC PANEL - Abnormal; Notable for the following:       Result Value   Sodium 134 (*)    Potassium 3.2 (*)    All other components within normal limits  LIPASE, BLOOD  CBC  URINALYSIS, ROUTINE W REFLEX MICROSCOPIC  I-STAT BETA HCG BLOOD, ED ([facility], [facility], [facility] ONLY)  I-STAT CG4 LACTIC ACID, ED  I-STAT CG4 LACTIC ACID, ED    EKG  EKG Interpretation None       Radiology No results found.  Procedures Procedures (including critical care time)  Medications Ordered in ED Medications  acetaminophen (TYLENOL) tablet 650 mg (650 mg Oral Given 10/09/16 1602)     Initial Impression / Assessment and Plan / ED Course  I have reviewed the triage vital signs and the nursing notes.  Pertinent labs & imaging results that were available during my care of the patient were reviewed by me and considered in my medical decision making (see chart for details).     Patient with fevers chills and left flank pain. Labs reassuring but urinalysis not been done yet. Patient states she has a baby at home and cannot stay here. Leaving AMA. Urine not done but story is somewhat worrisome for pyelonephritis with left flank pain. Discharge AMA.  Final Clinical Impressions(s) / ED Diagnoses   Final diagnoses:  Fever, unspecified fever cause  Flank pain     New Prescriptions New Prescriptions   No medications on file     Davonna Belling, MD 10/09/16 2104

## 2016-10-09 NOTE — ED Triage Notes (Signed)
Patient report left side pain, headache, and nasal congestion x4 days and fever at home x3 days. Denies emesis or diarrhea. Patient last took ibuprofen today at noon.

## 2016-10-09 NOTE — Discharge Instructions (Signed)
You're leaving AGAINST MEDICAL ADVICE. Feel free to return or follow-up as soon as possible for further evaluation.

## 2016-10-09 NOTE — ED Notes (Signed)
Pt stated she was leaving and didn't want to wait she has a small child at home.

## 2016-11-06 ENCOUNTER — Ambulatory Visit (INDEPENDENT_AMBULATORY_CARE_PROVIDER_SITE_OTHER): Payer: Medicaid Other | Admitting: Family Medicine

## 2016-11-06 ENCOUNTER — Encounter: Payer: Self-pay | Admitting: Family Medicine

## 2016-11-06 VITALS — BP 97/78 | HR 93 | Temp 98.9°F | Resp 16 | Ht 62.0 in | Wt 139.0 lb

## 2016-11-06 DIAGNOSIS — Z8639 Personal history of other endocrine, nutritional and metabolic disease: Secondary | ICD-10-CM

## 2016-11-06 DIAGNOSIS — R42 Dizziness and giddiness: Secondary | ICD-10-CM

## 2016-11-06 DIAGNOSIS — R82998 Other abnormal findings in urine: Secondary | ICD-10-CM

## 2016-11-06 DIAGNOSIS — N941 Unspecified dyspareunia: Secondary | ICD-10-CM | POA: Insufficient documentation

## 2016-11-06 DIAGNOSIS — R8299 Other abnormal findings in urine: Secondary | ICD-10-CM

## 2016-11-06 DIAGNOSIS — R300 Dysuria: Secondary | ICD-10-CM

## 2016-11-06 DIAGNOSIS — R102 Pelvic and perineal pain: Secondary | ICD-10-CM | POA: Insufficient documentation

## 2016-11-06 LAB — POCT URINALYSIS DIP (DEVICE)
Bilirubin Urine: NEGATIVE
GLUCOSE, UA: NEGATIVE mg/dL
KETONES UR: NEGATIVE mg/dL
Nitrite: NEGATIVE
PH: 6 (ref 5.0–8.0)
PROTEIN: 30 mg/dL — AB
Specific Gravity, Urine: 1.02 (ref 1.005–1.030)
Urobilinogen, UA: 0.2 mg/dL (ref 0.0–1.0)

## 2016-11-06 LAB — CBC WITH DIFFERENTIAL/PLATELET
Basophils Absolute: 0 cells/uL (ref 0–200)
Basophils Relative: 0 %
EOS PCT: 7 %
Eosinophils Absolute: 476 cells/uL (ref 15–500)
HCT: 39 % (ref 35.0–45.0)
HEMOGLOBIN: 13 g/dL (ref 11.7–15.5)
LYMPHS ABS: 2584 {cells}/uL (ref 850–3900)
Lymphocytes Relative: 38 %
MCH: 30.7 pg (ref 27.0–33.0)
MCHC: 33.3 g/dL (ref 32.0–36.0)
MCV: 92 fL (ref 80.0–100.0)
MONOS PCT: 7 %
MPV: 11 fL (ref 7.5–12.5)
Monocytes Absolute: 476 cells/uL (ref 200–950)
NEUTROS ABS: 3264 {cells}/uL (ref 1500–7800)
Neutrophils Relative %: 48 %
PLATELETS: 241 10*3/uL (ref 140–400)
RBC: 4.24 MIL/uL (ref 3.80–5.10)
RDW: 13.6 % (ref 11.0–15.0)
WBC: 6.8 10*3/uL (ref 3.8–10.8)

## 2016-11-06 LAB — GLUCOSE, CAPILLARY: Glucose-Capillary: 98 mg/dL (ref 65–99)

## 2016-11-06 LAB — POCT GLYCOSYLATED HEMOGLOBIN (HGB A1C): HEMOGLOBIN A1C: 5.3

## 2016-11-06 MED ORDER — IBUPROFEN 600 MG PO TABS: 600.0000 mg | ORAL_TABLET | Freq: Three times a day (TID) | ORAL | 0 refills | Status: DC | PRN

## 2016-11-06 MED FILL — IBUPROFEN 600 MG TABLET: 600 | 10 days supply | Qty: 30 | Fill #0

## 2016-11-06 NOTE — Patient Instructions (Addendum)
Will follow up in office after pelvic/transvaginal ultrasound for dyspareunia and pelvic pain  Take Ibuprofen 600 mg every 8 hours for mild to moderate pelvic pain. Take Ibuprofen with food   Pelvic Pain, Female Pelvic pain is pain felt below the belly button and between your hips. It can be caused by many different things. It is important to get help right away. This is especially true for severe, sharp, or unusual pain that comes on suddenly.  HOME CARE  Only take medicine as told by your doctor.  Rest as told by your doctor.  Eat a healthy diet, such as fruits, vegetables, and lean meats.  Drink enough fluids to keep your pee (urine) clear or pale yellow, or as told.  Avoid sex (intercourse) if it causes pain.  Apply warm or cold packs to your lower belly (abdomen). Use the type of pack that helps the pain.  Avoid situations that cause you stress.  Keep a journal to track your pain. Write down:  When the pain started.  Where it is located.  If there are things that seem to be related to the pain, such as food or your period.  Follow up with your doctor as told. GET HELP RIGHT AWAY IF:   You have heavy bleeding from the vagina.  You have more pelvic pain.  You feel lightheaded or pass out (faint).  You have chills.  You have pain when you pee or have blood in your pee.  You cannot stop having watery poop (diarrhea).  You cannot stop throwing up (vomiting).  You have a fever or lasting symptoms for more than 3 days.  You have a fever and your symptoms suddenly get worse.  You are being physically or sexually abused.  Your medicine does not help your pain.  You have fluid (discharge) coming from your vagina that is not normal. MAKE SURE YOU:  Understand these instructions.  Will watch your condition.  Will get help if you are not doing well or get worse. This information is not intended to replace advice given to you by your health care provider. Make  sure you discuss any questions you have with your health care provider. Document Released: 02/19/2008 Document Revised: 09/23/2014 Document Reviewed: 06/23/2015 Elsevier Interactive Patient Education  2017 [employer].

## 2016-11-06 NOTE — Progress Notes (Signed)
Subjective:    Patient ID: Sylvia Best, female    DOB: 1981/08/05, 36 y.o.   MRN: [id number]  Dizziness  This is a new problem. The current episode started 1 to 4 weeks ago. The problem occurs daily. The problem has been unchanged. Associated symptoms include abdominal pain, arthralgias, fatigue, joint swelling and myalgias. Pertinent negatives include no chest pain, congestion, diaphoresis, fever or headaches. The symptoms are aggravated by bending and standing. She has tried nothing for the symptoms.  Pelvic Pain  The patient's primary symptoms include pelvic pain. This is a recurrent problem. The problem occurs daily. The pain is moderate. The problem affects both sides. She is not pregnant. Associated symptoms include abdominal pain, back pain and painful intercourse. Pertinent negatives include no constipation, diarrhea, dysuria, fever, frequency, headaches or hematuria. She is sexually active. No, her partner does not have an STD. Contraceptive use: Implanon. Her menstrual history has been irregular. Her past medical history is significant for a Cesarean section.     Past Medical History:  Diagnosis Date  . Asthma   . Diabetes mellitus    gestational  . GERD (gastroesophageal reflux disease)    no meds  . Gestational diabetes    Social History   Social History  . Marital status: Married    Spouse name: N/A  . Number of children: N/A  . Years of education: N/A   Occupational History  . Not on file.   Social History Main Topics  . Smoking status: Never Smoker  . Smokeless tobacco: Never Used  . Alcohol use No  . Drug use: No  . Sexual activity: Yes    Birth control/ protection: None   Other Topics Concern  . Not on file   Social History Narrative  . No narrative on file   Review of Systems  Constitutional: Positive for fatigue. Negative for diaphoresis and fever.  HENT: Negative for congestion.   Cardiovascular: Negative for chest pain.  Gastrointestinal:  Positive for abdominal pain. Negative for constipation and diarrhea.  Genitourinary: Positive for pelvic pain. Negative for dysuria, frequency and hematuria.  Musculoskeletal: Positive for arthralgias, back pain, joint swelling and myalgias.  Neurological: Positive for dizziness. Negative for headaches.       Objective:   Physical Exam  Constitutional: She is oriented to person, place, and time. She appears well-developed and well-nourished.  HENT:  Head: Normocephalic and atraumatic.  Right Ear: External ear normal.  Left Ear: External ear normal.  Nose: Nose normal.  Mouth/Throat: Oropharynx is clear and moist.  Eyes: Conjunctivae and EOM are normal. Pupils are equal, round, and reactive to light.  Neck: Normal range of motion. Neck supple.  Cardiovascular: Normal rate, regular rhythm, normal heart sounds and intact distal pulses.   Pulmonary/Chest: Effort normal and breath sounds normal.  Abdominal: Soft. Bowel sounds are normal. There is tenderness in the right lower quadrant.  Musculoskeletal:       Lumbar back: She exhibits tenderness and pain.  Neurological: She is alert and oriented to person, place, and time. She has normal reflexes.  Skin: Skin is warm and dry.  Psychiatric: She has a normal mood and affect. Her behavior is normal. Judgment and thought content normal.      BP 97/78 (BP Location: Right Arm, Patient Position: Sitting, Cuff Size: Normal)   Pulse 93   Temp 98.9 F (37.2 C) (Oral)   Resp 16   Ht 5\' 2"  (1.575 m)   Wt 139 lb (63 kg)  SpO2 99%   BMI 25.42 kg/m  Assessment & Plan:  1. History of diabetes mellitus Hemoglobin a1c is 5.3. No DMII - HgB A1c  2. Dizziness Blood pressures low; discussed orthostatic hypotension at length.  - Orthostatic vital signs - Glucose (CBG)  3. Pelvic pain - CBC with Differential - Basic Metabolic Panel - US Transvaginal Non-OB; Future - US Pelvis Complete; Future - ibuprofen (ADVIL,MOTRIN) 600 MG tablet; Take 1  tablet (600 mg total) by mouth every 8 (eight) hours as needed.  Dispense: 30 tablet; Refill: 0  4. Dysuria Small leukocytes, will order urine culture   5. Dyspareunia in female - US Transvaginal Non-OB; Future - US Pelvis Complete; Future  6. Urine leukocytes - Urine culture   RTC: Following transvaginal ultrasound   Wilkins Elpers M, FNP    The patient was given clear instructions to go to ER or return to medical center if symptoms do not improve, worsen or new problems develop. The patient verbalized understanding. Will notify patient with laboratory results.

## 2016-11-07 LAB — BASIC METABOLIC PANEL
BUN: 13 mg/dL (ref 7–25)
CO2: 22 mmol/L (ref 20–31)
Calcium: 9.5 mg/dL (ref 8.6–10.2)
Chloride: 104 mmol/L (ref 98–110)
Creat: 0.67 mg/dL (ref 0.50–1.10)
GLUCOSE: 94 mg/dL (ref 65–99)
POTASSIUM: 3.9 mmol/L (ref 3.5–5.3)
SODIUM: 138 mmol/L (ref 135–146)

## 2016-11-08 LAB — URINE CULTURE

## 2016-11-11 ENCOUNTER — Ambulatory Visit (HOSPITAL_COMMUNITY): Payer: Self-pay

## 2016-11-11 ENCOUNTER — Other Ambulatory Visit: Payer: Self-pay | Admitting: Family Medicine

## 2016-11-11 DIAGNOSIS — N3000 Acute cystitis without hematuria: Secondary | ICD-10-CM

## 2016-11-11 MED ORDER — SULFAMETHOXAZOLE-TRIMETHOPRIM 800-160 MG PO TABS: 1.0000 | ORAL_TABLET | Freq: Two times a day (BID) | ORAL | 0 refills | Status: AC

## 2016-11-11 MED FILL — SULFAMETHOXAZOLE/TMP DS TAB: 800-160 | 7 days supply | Qty: 14 | Fill #0

## 2016-11-11 NOTE — Progress Notes (Signed)
Reviewed labs. Sylvia Best has Klebsiella pneumonia yielded in urine culture.  Will start Bactrim 800-160 mg twice daily for 7 days. Patient instructed to Increase water intake, wipe from front to back, and practice good perianal hygiene. Please follow up in office as scheduled.   Meds ordered this encounter  Medications  . sulfamethoxazole-trimethoprim (BACTRIM DS) 800-160 MG tablet    Sig: Take 1 tablet by mouth 2 (two) times daily.    Dispense:  14 tablet    Refill:  0    Jeanne Terrance M, FNP

## 2016-11-11 NOTE — Progress Notes (Signed)
[place] INTERPRETER [id number]. NO ANSWER VOICEMAIL WAS LEFT ADVISING PATIENT OF BACTERIA IN URINE AND THE NEED TO START ABX AND TAKE AS DIRECTED UNTIL COMPLETE. ADVISED TO INCREASE WATER INTAKE AND WIPE FROM FRONT TO BACK WHEN USING THE RESTROOM. ASKED IF ANY QUESTIONS TO CALL BACK TO OUR OFFICE AND CALLBACK NUMBER WAS LEFT. THANKS!

## 2016-11-13 ENCOUNTER — Telehealth: Payer: Self-pay

## 2016-11-13 ENCOUNTER — Ambulatory Visit (HOSPITAL_COMMUNITY)
Admission: RE | Admit: 2016-11-13 | Discharge: 2016-11-13 | Disposition: A | Discharge status: Home / Self Care | Payer: Medicaid Other | Source: Ambulatory Visit | Attending: Family Medicine | Admitting: Family Medicine

## 2016-11-13 DIAGNOSIS — N941 Unspecified dyspareunia: Secondary | ICD-10-CM | POA: Insufficient documentation

## 2016-11-13 DIAGNOSIS — R102 Pelvic and perineal pain: Secondary | ICD-10-CM | POA: Insufficient documentation

## 2016-11-13 NOTE — Telephone Encounter (Signed)
Patient called back. I advised I would call Interpreter services and call her back.   Called using [employer] ID# [phone]. Advised patient to small uterine fibroid and that no future follow up is needed right now but we will monitor it in the future. Patient states understanding and is now complain of vaginal itching and discharge. This call was transferred to front desk for patient to make an appointment for this. Thanks!

## 2016-11-13 NOTE — Telephone Encounter (Signed)
-----   Message from Dorena Dew, [place] sent at 11/13/2016 12:47 PM EST ----- Regarding: image results Patient primarily speaks Arabic, we will use the language line to call patient. She has a small uterine fibroid on ultrasound. We will continue to watch it and assess symptoms. Follow up in office as scheduled.   Thanks ----- Message ----- From: Interface, Rad Results In Sent: 11/13/2016  12:26 PM To: Dorena Dew, FNP

## 2016-11-13 NOTE — Telephone Encounter (Signed)
Called using interpreter ID [phone] with [employer] interpreters. NO answer, message was left for patient to return call reading ultrasound. Thanks!

## 2016-11-15 ENCOUNTER — Encounter: Payer: Self-pay | Admitting: Family Medicine

## 2016-11-15 ENCOUNTER — Ambulatory Visit (INDEPENDENT_AMBULATORY_CARE_PROVIDER_SITE_OTHER): Payer: Medicaid Other | Admitting: Family Medicine

## 2016-11-15 VITALS — BP 102/67 | HR 78 | Temp 98.3°F | Resp 16 | Ht 62.0 in | Wt 140.0 lb

## 2016-11-15 DIAGNOSIS — L298 Other pruritus: Secondary | ICD-10-CM | POA: Diagnosis not present

## 2016-11-15 DIAGNOSIS — B3731 Acute candidiasis of vulva and vagina: Secondary | ICD-10-CM

## 2016-11-15 DIAGNOSIS — N898 Other specified noninflammatory disorders of vagina: Secondary | ICD-10-CM

## 2016-11-15 DIAGNOSIS — B373 Candidiasis of vulva and vagina: Secondary | ICD-10-CM

## 2016-11-15 LAB — POCT WET PREP WITH KOH
KOH Prep POC: POSITIVE — AB
TRICHOMONAS UA: NEGATIVE

## 2016-11-15 MED ORDER — FLUCONAZOLE 150 MG PO TABS: 150.0000 mg | ORAL_TABLET | Freq: Once | ORAL | 0 refills | Status: AC

## 2016-11-15 MED ORDER — CLOTRIMAZOLE 1 % VA CREA: 1.0000 | TOPICAL_CREAM | Freq: Every day | VAGINAL | 0 refills | Status: DC

## 2016-11-15 MED FILL — FLUCONAZOLE 150 MG TABLET: 150 | 1 days supply | Qty: 2 | Fill #0

## 2016-11-15 NOTE — Progress Notes (Signed)
Subjective:    Patient ID: Sylvia Best, female    DOB: 28-Jun-1981, 36 y.o.   MRN: [id number]  Vaginal Itching  The patient's primary symptoms include genital itching and vaginal discharge. The patient's pertinent negatives include no genital lesions, genital odor, genital rash, missed menses, pelvic pain or vaginal bleeding. Primary symptoms comment: Patient was treated for a urinary tract infection with oral antibiotics 7 days ago. Organism was Klebsiella pneumonia. . This is a new problem. The current episode started in the past 7 days. The problem occurs constantly. The problem has been unchanged. The patient is experiencing no pain. The problem affects both sides. She is not pregnant. Pertinent negatives include no abdominal pain, anorexia, back pain, chills, discolored urine, dysuria, fever, flank pain, frequency, headaches, hematuria, joint pain, joint swelling, nausea, painful intercourse, rash, sore throat, urgency or vomiting. She has not been passing clots. She has not been passing tissue. She is sexually active. No, her partner does not have an STD. She uses nothing for contraception.  Vaginal Discharge  The patient's primary symptoms include genital itching and vaginal discharge. The patient's pertinent negatives include no genital lesions, genital odor, genital rash, missed menses, pelvic pain or vaginal bleeding. Primary symptoms comment: Patient was treated for a urinary tract infection with oral antibiotics 7 days ago. Organism was Klebsiella pneumonia. . This is a new problem. The current episode started in the past 7 days. The patient is experiencing no pain. Pertinent negatives include no abdominal pain, anorexia, back pain, chills, discolored urine, dysuria, fever, flank pain, frequency, headaches, hematuria, joint pain, joint swelling, nausea, painful intercourse, rash, sore throat, urgency or vomiting.     Past Medical History:  Diagnosis Date  . Asthma   . Diabetes mellitus    gestational  . GERD (gastroesophageal reflux disease)    no meds  . Gestational diabetes    Social History   Social History  . Marital status: Married    Spouse name: N/A  . Number of children: N/A  . Years of education: N/A   Occupational History  . Not on file.   Social History Main Topics  . Smoking status: Never Smoker  . Smokeless tobacco: Never Used  . Alcohol use No  . Drug use: No  . Sexual activity: Yes    Birth control/ protection: None   Other Topics Concern  . Not on file   Social History Narrative  . No narrative on file   Review of Systems  Constitutional: Negative for chills and fever.  HENT: Negative for sore throat.   Gastrointestinal: Negative for abdominal pain, anorexia, nausea and vomiting.  Genitourinary: Positive for vaginal discharge. Negative for dysuria, flank pain, frequency, hematuria, missed menses, pelvic pain and urgency.  Musculoskeletal: Negative for back pain and joint pain.  Skin: Negative for rash.  Neurological: Negative for headaches.       Objective:   Physical Exam  Constitutional: She is oriented to person, place, and time. She appears well-developed and well-nourished.  HENT:  Head: Normocephalic and atraumatic.  Right Ear: External ear normal.  Left Ear: External ear normal.  Nose: Nose normal.  Mouth/Throat: Oropharynx is clear and moist.  Eyes: Conjunctivae and EOM are normal. Pupils are equal, round, and reactive to light.  Neck: Normal range of motion. Neck supple.  Cardiovascular: Normal rate, regular rhythm, normal heart sounds and intact distal pulses.   Pulmonary/Chest: Effort normal and breath sounds normal.  Abdominal: Soft. Bowel sounds are normal. There is tenderness in  the right lower quadrant.  Genitourinary: Vaginal discharge found.  Musculoskeletal:       Lumbar back: She exhibits tenderness and pain.  Neurological: She is alert and oriented to person, place, and time. She has normal reflexes.  Skin:  Skin is warm and dry.  Psychiatric: She has a normal mood and affect. Her behavior is normal. Judgment and thought content normal.      BP 102/67 (BP Location: Left Arm, Patient Position: Sitting, Cuff Size: Normal)   Pulse 78   Temp 98.3 F (36.8 C) (Oral)   Resp 16   Ht 5\' 2"  (1.575 m)   Wt 140 lb (63.5 kg)   SpO2 100%   BMI 25.61 kg/m  Assessment & Plan:  1. Vaginal yeast infection - fluconazole (DIFLUCAN) 150 MG tablet; Take 1 tablet (150 mg total) by mouth once.  Dispense: 2 tablet; Refill: 0 - clotrimazole (GYNE-LOTRIMIN) 1 % vaginal cream; Place 1 Applicatorful vaginally at bedtime.  Dispense: 45 g; Refill: 0  2. Vaginal itching - POCT Wet Prep with KOH  3. Vaginal discharge - POCT Wet Prep with KOH RTC: Following transvaginal ultrasound   RTC: As previously scheduled  Travious Vanover M, FNP   Cyara Devoto M, FNP    The patient was given clear instructions to go to ER or return to medical center if symptoms do not improve, worsen or new problems develop. The patient verbalized understanding. Will notify patient with laboratory results.

## 2016-12-06 IMAGING — US US MFM FETAL NUCHAL TRANSLUCENCY
1 series · 15 of 21 positions shown · non-contrast
Comparison: none

[Series 1: us mfm fetal nuchal translucency · 15 of 21 slices shown]
[im 1/21]
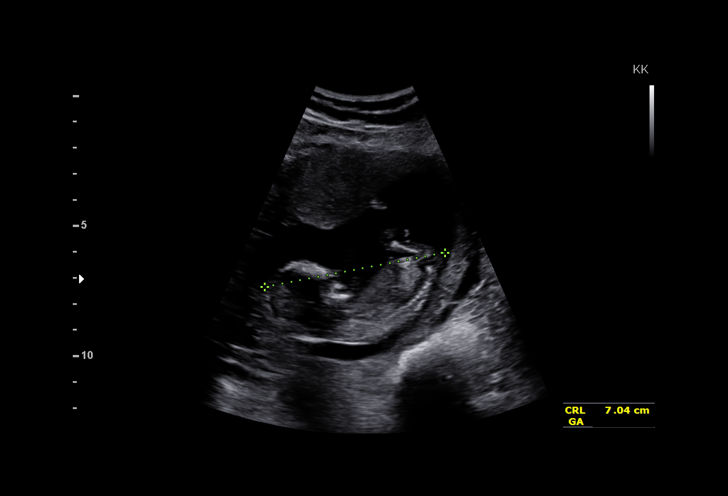
[im 3/21]
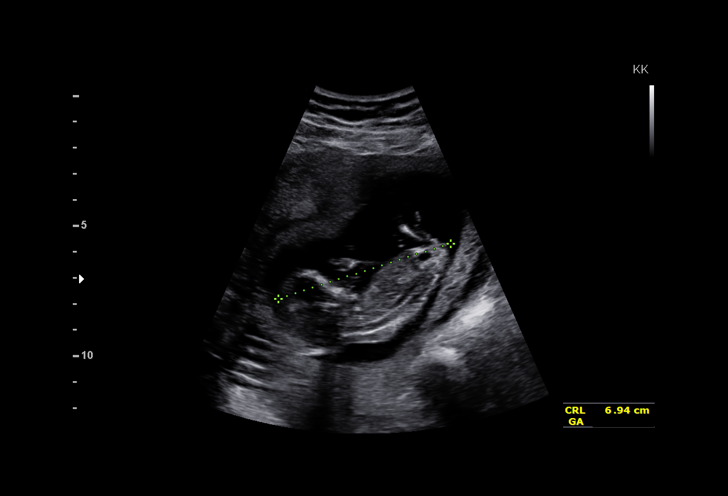
[im 4/21]
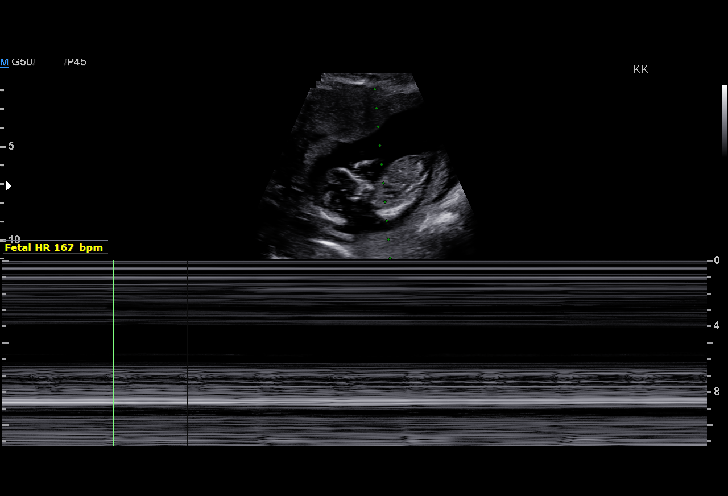
[im 5/21]
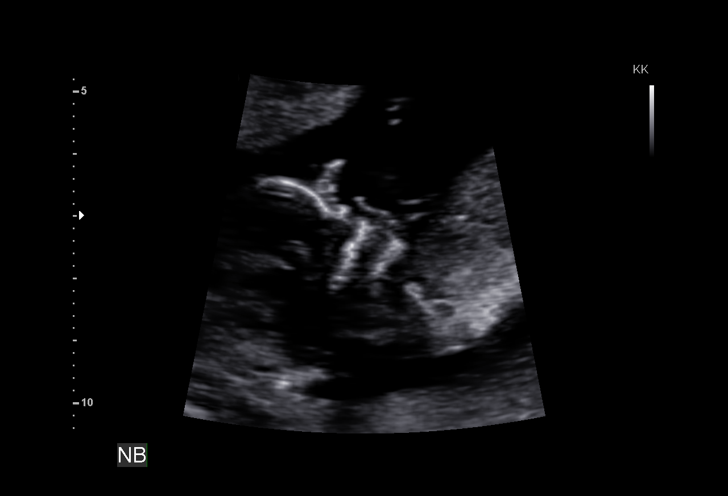
[im 7/21]
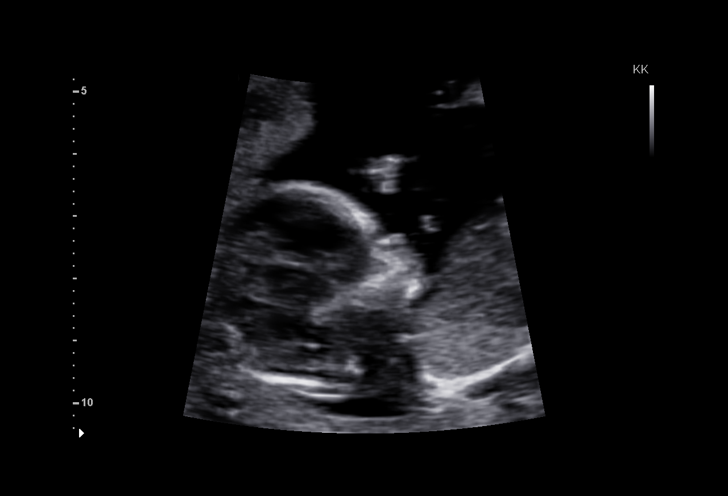
[im 8/21]
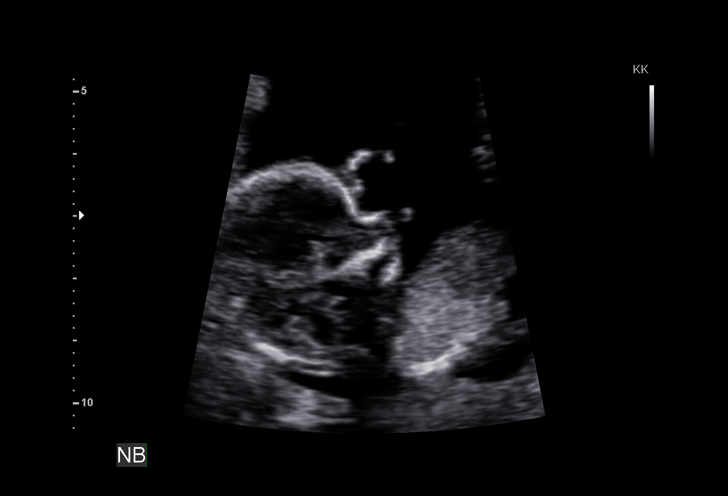
[im 10/21]
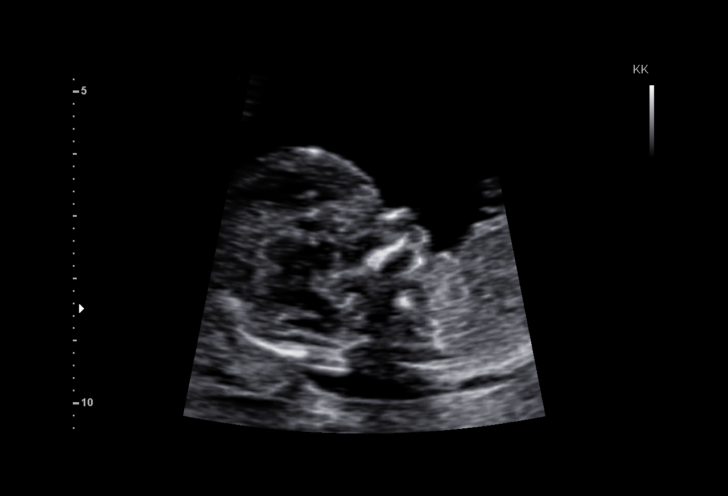
[im 11/21]
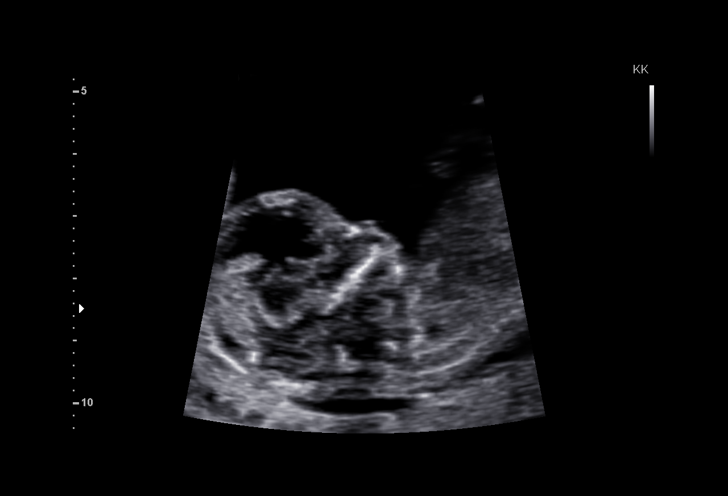
[im 12/21]
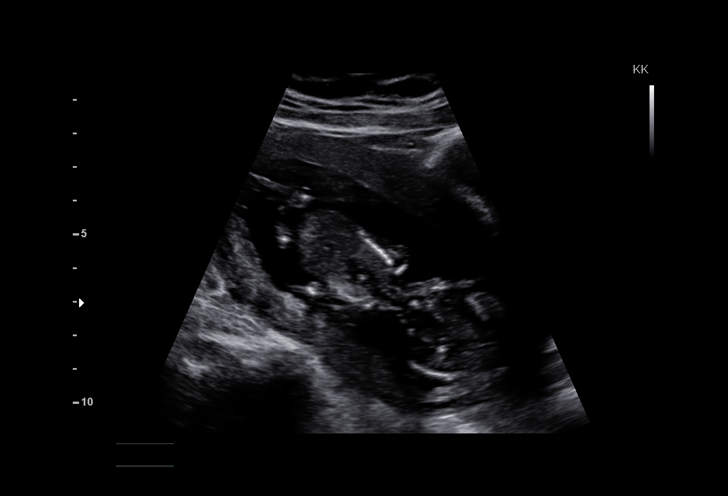
[im 14/21]
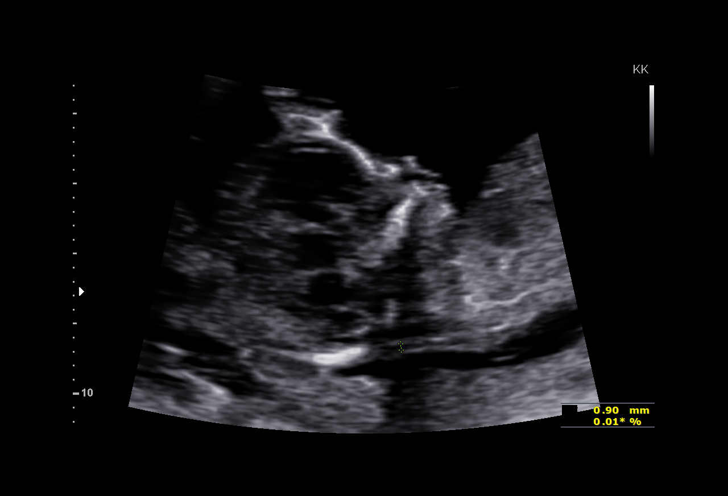
[im 15/21]
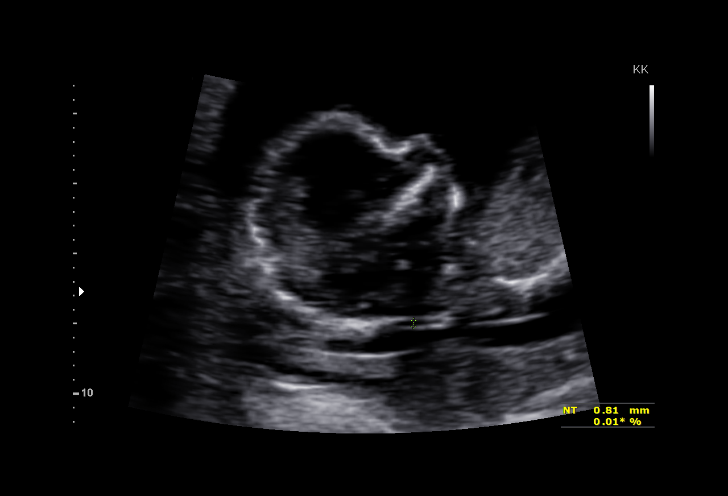
[im 17/21]
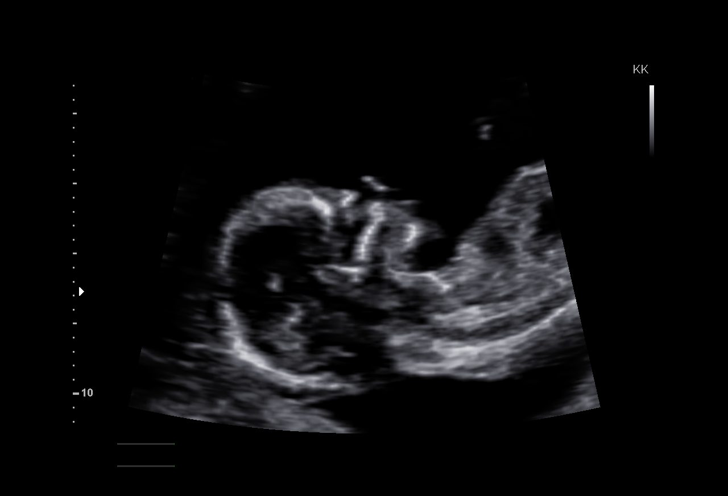
[im 18/21]
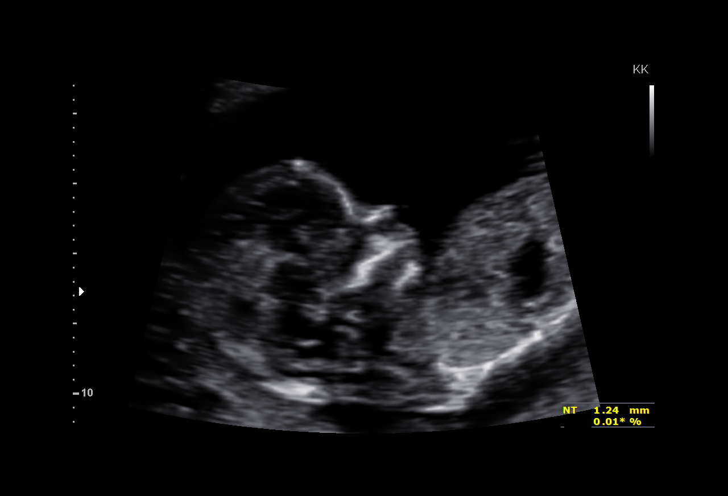
[im 19/21]
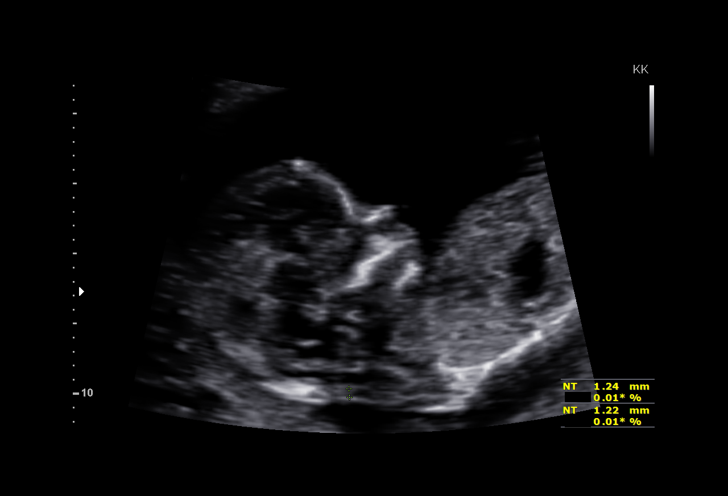
[im 21/21]
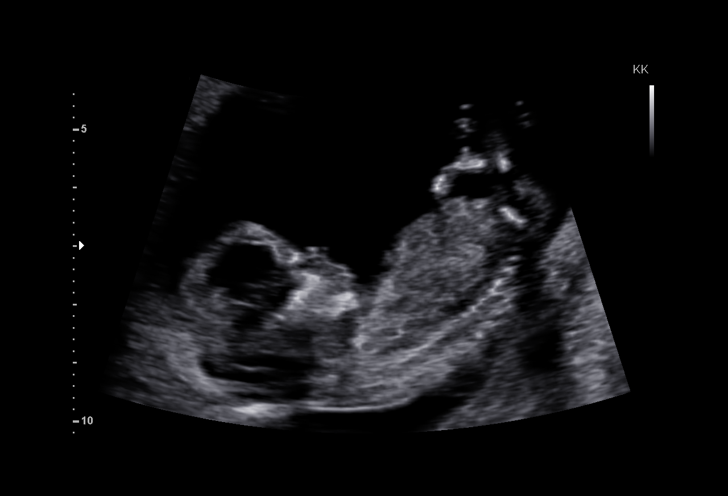

[15 of 21 positions shown; findings below may reference images not displayed]

pm)

Name:       GE GRIGGS                           Visit  10/12/2015 [DATE]
Date:

TRANSLUCENCY

1  GARROURI ULE           [id number]       [id number]     [id number]
Indications

First trimester aneuploidy screen (NT)          Z36
Advanced maternal age multigravida 35+,
second trimester
History of cesarean delivery, currently
pregnant
Poor obstetric history: Previous gestational
diabetes
OB History

Gravidity:     4         Term:  1         SAB:    2
Living:        2
Fetal Evaluation

Num Of Fetuses:      1
Fetal Heart          167
Rate(bpm):
Cardiac Activity:    Observed
Presentation:        Variable

Amniotic Fluid
AFI FV:      Subjectively within normal limits
Biometry
CRL:      70.3  mm     G. Age:   13w 0d                  EDD:   04/18/16

NT:        1.3  mm
Gestational Age

LMP:           13w 6d        Date:  07/07/15                  EDD:   04/12/16
Best:          13w 6d    Det. By:   LMP  (07/07/15)           EDD:   04/12/16
Impression

Single IUP at 13w 6d
Normal NT (1.3 mm).  Nasal bone visualized.
First trimester aneuploidy screen performed as noted
above.  Please do not draw triple/quad screen, though
patient should be offered MSAFP for neural tube defect
screening.
Recommendations

Recommend ultrasound for fetal anatomy at 18-20 weeks
gestation

## 2017-01-08 IMAGING — US US MFM OB DETAIL+14 WK
1 series · 13 of 28 positions shown · non-contrast
Comparison: none

[Series 1: us mfm ob detail+14 wk · 13 of 70 slices shown]
[im 3/70]
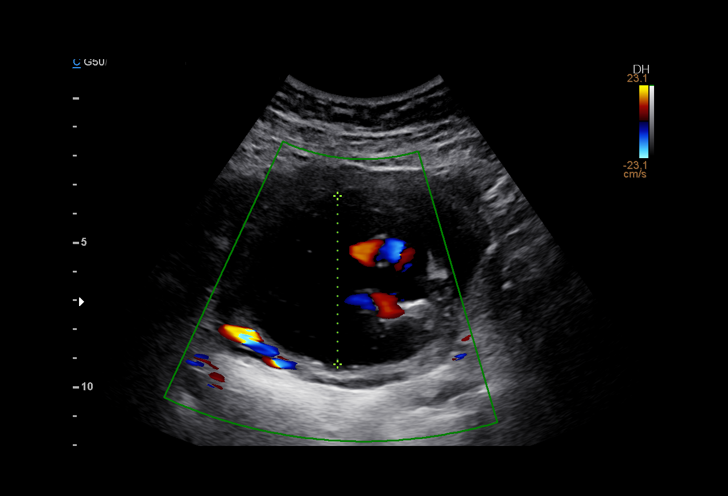
[im 8/70]
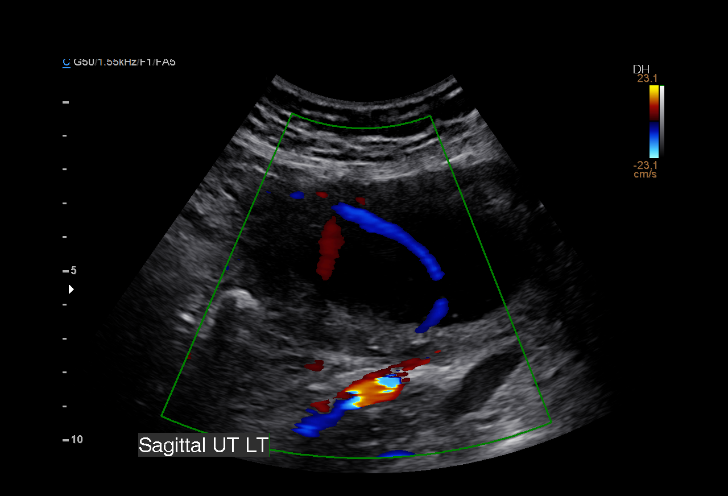
[im 13/70]
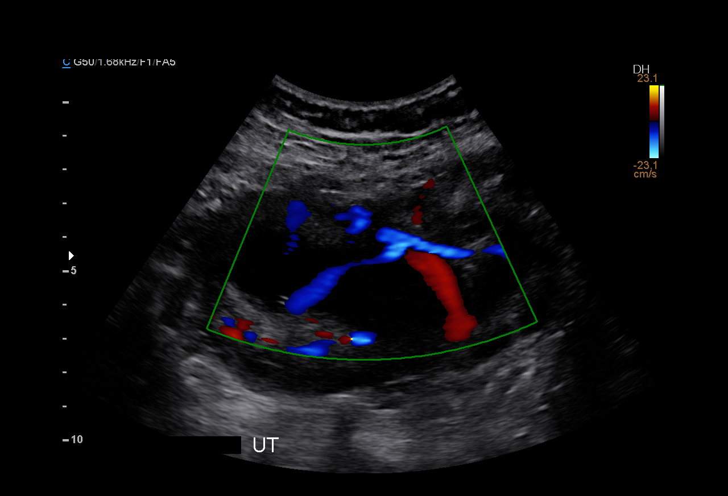
[im 18/70]
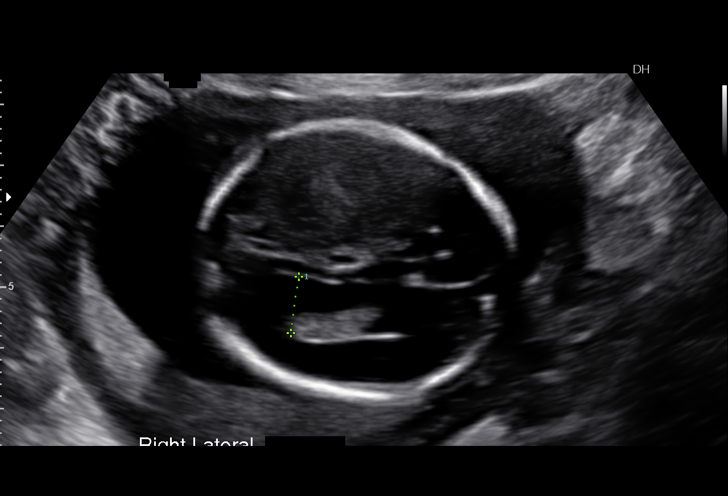
[im 24/70]
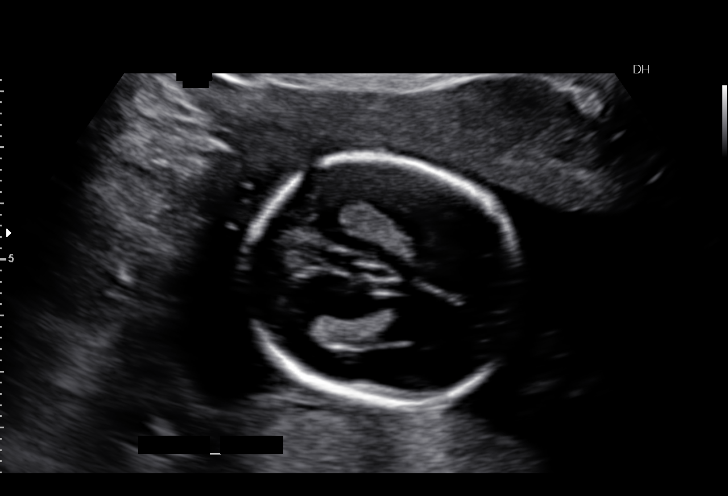
[im 29/70]
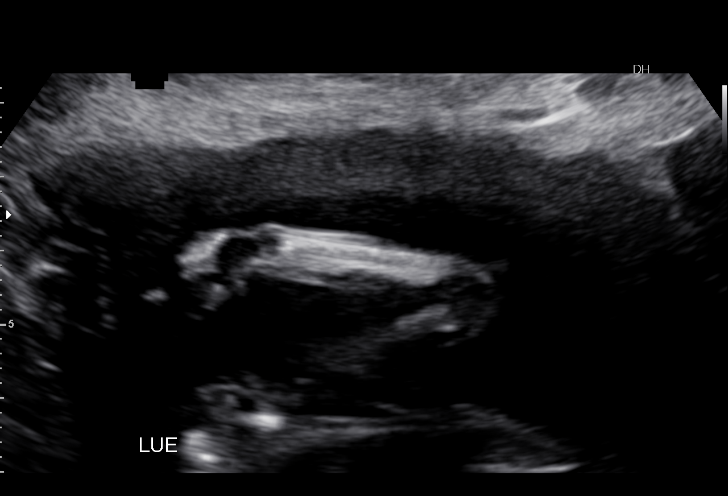
[im 36/70]
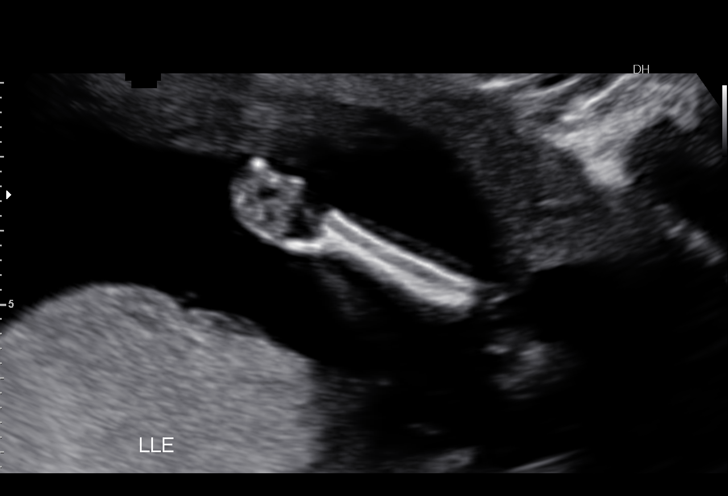
[im 41/70]
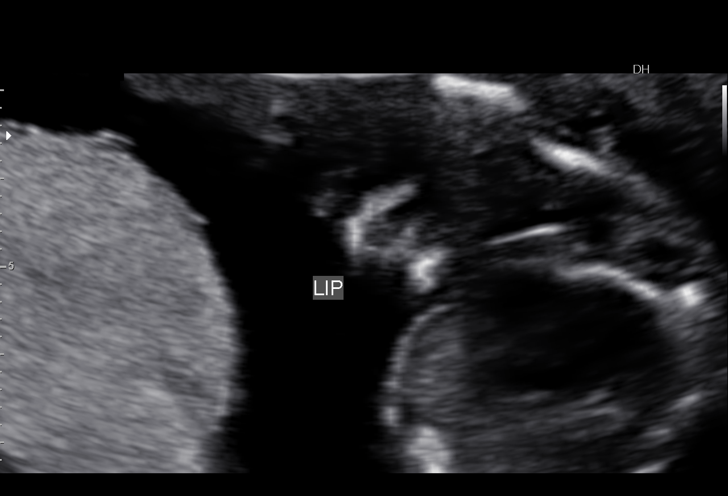
[im 47/70]
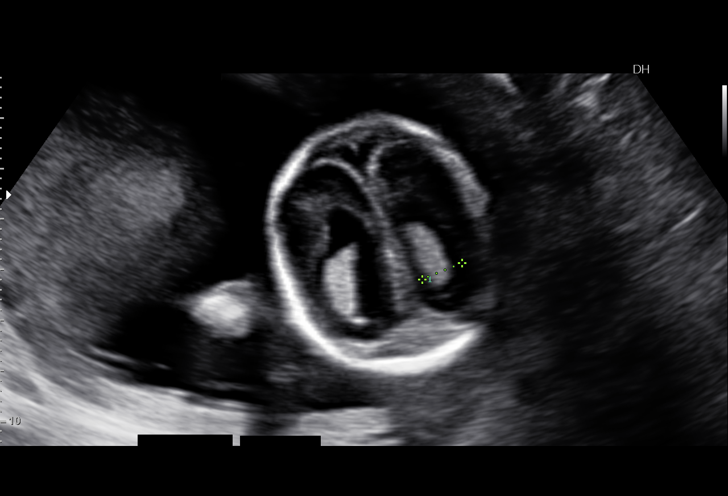
[im 52/70]
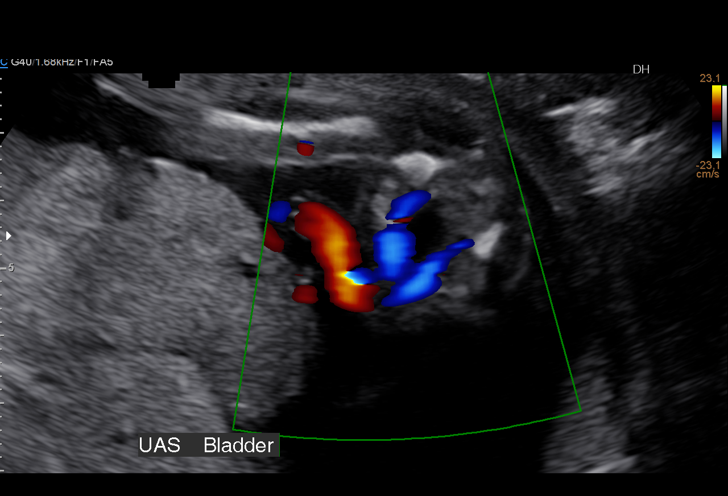
[im 57/70]
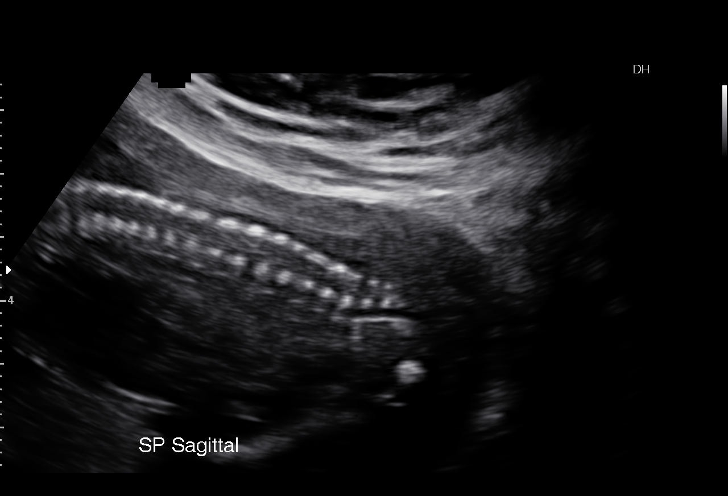
[im 62/70]
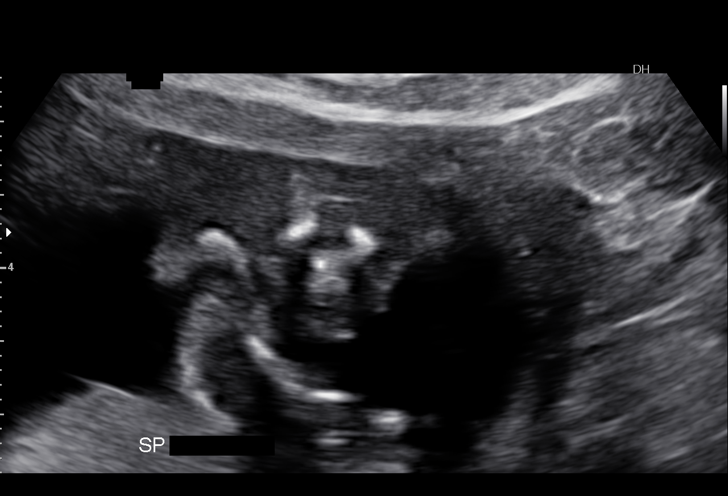
[im 67/70]
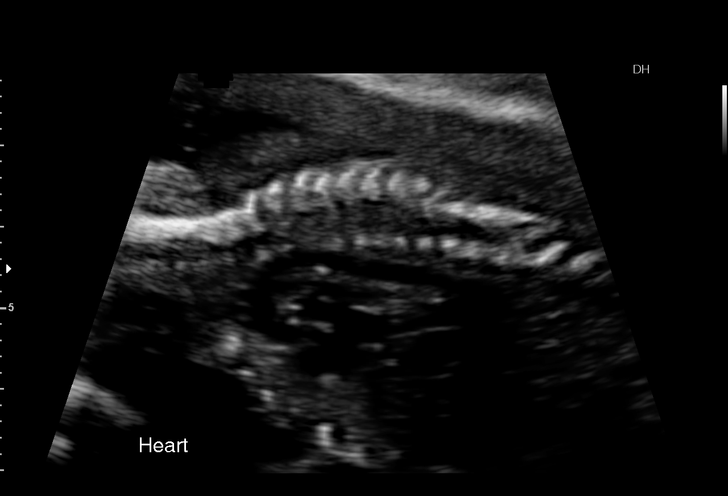

[13 of 28 positions shown; findings below may reference images not displayed]

[facility]-
Faculty Physician
OB/Gyn Clinic

1  MOHAMUD VILLALVAZO            [id number]      [id number]     [id number]
Indications

18 weeks gestation of pregnancy
Detailed fetal anatomic survey                 Z36
Advanced maternal age multigravida 35,
second trimester - negative first trimester
screen
History of cesarean delivery, currently
pregnant
Poor obstetric history: Previous gestational
diabetes
OB History

Gravidity:    4         Term:   1         SAB:   2
Living:       2
Fetal Evaluation

Num Of Fetuses:     1
Fetal Heart         161
Rate(bpm):
Cardiac Activity:   Observed
Presentation:       Variable
Placenta:           Posterior, above cervical os
P. Cord Insertion:  Velamentous ins (anterior)
Amniotic Fluid
AFI FV:      Subjectively within normal limits
Larg Pckt:     5.8  cm
Biometry

BPD:      44.9  mm     G. Age:  19w 4d                  CI:        78.06   %   70 - 86
FL/HC:      15.6   %   16.1 -
HC:      160.8  mm     G. Age:  18w 6d         58  %    HC/AC:      1.22       1.09 -
AC:      131.5  mm     G. Age:  18w 5d         50  %    FL/BPD:     55.9   %
FL:       25.1  mm     G. Age:  17w 4d         14  %    FL/AC:      19.1   %   20 - 24
HUM:        26  mm     G. Age:  18w 1d         42  %
CER:      18.7  mm     G. Age:  18w 2d         42  %
NFT:       2.7  mm
CM:        2.4  mm

Est. FW:     232  gm      0 lb 8 oz     40  %
Gestational Age

LMP:           18w 4d       Date:   07/07/15                 EDD:   04/12/16
U/S Today:     18w 5d                                        EDD:   04/11/16
Best:          18w 4d    Det. By:   LMP  (07/07/15)          EDD:   04/12/16
Anatomy

Cranium:          Appears normal         Aortic Arch:      Appears normal
Fetal Cavum:      Appears normal         Ductal Arch:      Not well visualized
Ventricles:       Appears normal         Diaphragm:        Not well visualized
Choroid Plexus:   Appears normal         Stomach:          Appears normal, left
sided
Cerebellum:       Appears normal         Abdomen:          Appears normal
Posterior Fossa:  Appears normal         Abdominal Wall:   Appears nml (cord
insert, abd wall)
Nuchal Fold:      Appears normal         Cord Vessels:     Appears normal (3
vessel cord)
Face:             Appears normal         Kidneys:          Appear normal
(orbits and profile)
Lips:             Appears normal         Bladder:          Appears normal
Fetal Thoracic:   Appears normal         Spine:            Appears normal
Heart:            Not well visualized    Upper             Appears normal
Extremities:
RVOT:             Not well visualized    Lower             Appears normal
Extremities:
LVOT:             Not well visualized

Other:  Fetus appears to be a male. Heels and 5th digit visualized. Nasal
bone visualized.
Cervix Uterus Adnexa

Cervix
Length:              3  cm.
Normal appearance by transabdominal scan.

Left Ovary
Within normal limits.

Right Ovary
Within normal limits.

Adnexa:       No abnormality visualized.
Impression

Single IUP at 18w 4d
Advanced maternal age
Limited views of the fetal heart were obtained
While techically normal, the right cerebral lateral ventricle
appears mildy dilated compared to the left.  The lateral
ventricle measures 8-9 mm (normal <10 mm).  The left lateral
ventricle measures 7 mm.
The remainder of the fetal anatomy appears normal
A velamentous placental cord insertion is noted - well away
from the cervix
Normal amniotic fluid volume
Recommendations

Recommend follow-up ultrasound examination in 4 weeks to
reevaluate the fetal heart and lateral ventricles.

## 2017-01-29 ENCOUNTER — Ambulatory Visit (INDEPENDENT_AMBULATORY_CARE_PROVIDER_SITE_OTHER): Payer: Medicaid Other | Admitting: Family Medicine

## 2017-01-29 ENCOUNTER — Encounter: Payer: Self-pay | Admitting: Family Medicine

## 2017-01-29 ENCOUNTER — Other Ambulatory Visit (HOSPITAL_COMMUNITY)
Admission: RE | Admit: 2017-01-29 | Discharge: 2017-01-29 | Disposition: A | Discharge status: Home / Self Care | Payer: Self-pay | Source: Ambulatory Visit | Attending: Family Medicine | Admitting: Family Medicine

## 2017-01-29 VITALS — BP 101/71 | HR 68 | Temp 98.1°F | Resp 14 | Ht 62.0 in | Wt 140.0 lb

## 2017-01-29 DIAGNOSIS — N898 Other specified noninflammatory disorders of vagina: Secondary | ICD-10-CM

## 2017-01-29 DIAGNOSIS — L309 Dermatitis, unspecified: Secondary | ICD-10-CM | POA: Diagnosis not present

## 2017-01-29 DIAGNOSIS — N61 Mastitis without abscess: Secondary | ICD-10-CM | POA: Diagnosis not present

## 2017-01-29 DIAGNOSIS — L298 Other pruritus: Secondary | ICD-10-CM | POA: Diagnosis not present

## 2017-01-29 LAB — CBC WITH DIFFERENTIAL/PLATELET
BASOS ABS: 0 {cells}/uL (ref 0–200)
Basophils Relative: 0 %
EOS ABS: 248 {cells}/uL (ref 15–500)
Eosinophils Relative: 4 %
HEMATOCRIT: 37.4 % (ref 35.0–45.0)
HEMOGLOBIN: 12.5 g/dL (ref 11.7–15.5)
LYMPHS ABS: 2852 {cells}/uL (ref 850–3900)
Lymphocytes Relative: 46 %
MCH: 30.9 pg (ref 27.0–33.0)
MCHC: 33.4 g/dL (ref 32.0–36.0)
MCV: 92.6 fL (ref 80.0–100.0)
MONO ABS: 496 {cells}/uL (ref 200–950)
MONOS PCT: 8 %
MPV: 10.8 fL (ref 7.5–12.5)
NEUTROS ABS: 2604 {cells}/uL (ref 1500–7800)
Neutrophils Relative %: 42 %
Platelets: 242 10*3/uL (ref 140–400)
RBC: 4.04 MIL/uL (ref 3.80–5.10)
RDW: 13.4 % (ref 11.0–15.0)
WBC: 6.2 10*3/uL (ref 3.8–10.8)

## 2017-01-29 MED ORDER — CEPHALEXIN 500 MG PO CAPS: 500.0000 mg | ORAL_CAPSULE | Freq: Four times a day (QID) | ORAL | 0 refills | Status: DC

## 2017-01-29 MED ORDER — TRIAMCINOLONE ACETONIDE 0.1 % EX CREA: 1.0000 "application " | TOPICAL_CREAM | Freq: Two times a day (BID) | CUTANEOUS | 2 refills | Status: DC

## 2017-01-29 MED ORDER — FLUCONAZOLE 150 MG PO TABS: 150.0000 mg | ORAL_TABLET | Freq: Once | ORAL | 0 refills | Status: AC

## 2017-01-29 MED FILL — ?TRIAMCINOLONE 0.1% CREAM: 0.1 | 20 days supply | Qty: 30 | Fill #0

## 2017-01-29 MED FILL — CEPHALEXIN 500 MG CAPSULE: 500 | 7 days supply | Qty: 28 | Fill #0

## 2017-01-29 MED FILL — FLUCONAZOLE 150 MG TABLET: 150 | 1 days supply | Qty: 1 | Fill #0

## 2017-01-29 NOTE — Patient Instructions (Addendum)
Mastitis Use heat and gentle massage prior to nursing. Warm compress, try using a disposable diaper, fill the diaper with hot water (try the temperature on your wrist to avoid burns. It will stay warm a lot longer than a typical cloth  Massage willhelp to improve milk drainage and improve symptoms.   Will start Keflex 500 mg four times per day for 7 days   Will check white blood cell count   Dermatitis:   Apply triamcinolone cream to affected area twice daily   Vaginal itching:   Will send samples to cytology, will follow up by phone with laboratory results

## 2017-01-29 NOTE — Progress Notes (Signed)
Subjective:    Patient ID: Sylvia Best, female    DOB: 1981-03-02, 36 y.o.   MRN: [id number]  [name], a 36 year old female presents complaining of right breast pain and a rash to upper chest and left arm. Ms. Ulrich primarily speaks Arabic, will use the language line to assist with communication. She is complaining of right breast pain. She has been breast feeding over the past 9 months. She says describes pain as constant and intermittent. She says that breast is red and warm. She has not attempted any OTC interventions to assist with symptoms.    Vaginal Itching  This is a new problem. The current episode started in the past 7 days. The problem occurs constantly. The problem has been unchanged. The patient is experiencing no pain. The problem affects both sides. She is not pregnant. Associated symptoms include rash. Pertinent negatives include no fever. She has not been passing clots. She has not been passing tissue. She is sexually active. No, her partner does not have an STD. She uses nothing for contraception.  Rash  This is a chronic problem. The current episode started more than 1 month ago. The affected locations include the chest and left arm. The rash is characterized by itchiness and redness. She was exposed to nothing. Pertinent negatives include no fatigue or fever. Past treatments include nothing. Her past medical history is significant for eczema. There is no history of allergies, asthma or varicella.     Past Medical History:  Diagnosis Date  . Asthma   . Diabetes mellitus    gestational  . GERD (gastroesophageal reflux disease)    no meds  . Gestational diabetes    Social History   Social History  . Marital status: Married    Spouse name: N/A  . Number of children: N/A  . Years of education: N/A   Occupational History  . Not on file.   Social History Main Topics  . Smoking status: Never Smoker  . Smokeless tobacco: Never Used  . Alcohol use No  . Drug  use: No  . Sexual activity: Yes    Birth control/ protection: None   Other Topics Concern  . Not on file   Social History Narrative  . No narrative on file   Review of Systems  Constitutional: Negative for fatigue and fever.  Eyes: Negative.   Respiratory: Negative.   Cardiovascular: Negative.   Gastrointestinal: Negative.   Endocrine: Negative.   Genitourinary: Negative.        Vaginal itching  Musculoskeletal: Negative.   Skin: Positive for rash.       Right breast pain with irritation  Allergic/Immunologic: Negative.   Hematological: Negative.   Psychiatric/Behavioral: Negative.        Objective:   Physical Exam  Constitutional: She is oriented to person, place, and time. She appears well-developed and well-nourished.  HENT:  Head: Normocephalic and atraumatic.  Right Ear: External ear normal.  Left Ear: External ear normal.  Nose: Nose normal.  Mouth/Throat: Oropharynx is clear and moist.  Eyes: Conjunctivae and EOM are normal. Pupils are equal, round, and reactive to light.  Neck: Normal range of motion. Neck supple.  Cardiovascular: Normal rate, regular rhythm, normal heart sounds and intact distal pulses.   Pulmonary/Chest: Effort normal and breath sounds normal. Right breast exhibits skin change and tenderness.    Abdominal: Soft. Bowel sounds are normal. There is tenderness in the right lower quadrant.  Genitourinary: Vaginal discharge found.  Musculoskeletal:  Lumbar back: She exhibits tenderness and pain.  Neurological: She is alert and oriented to person, place, and time. She has normal reflexes.  Skin: Skin is warm and dry.  Psychiatric: She has a normal mood and affect. Her behavior is normal. Judgment and thought content normal.      BP 101/71 (BP Location: Right Arm, Patient Position: Sitting, Cuff Size: Normal)   Pulse 68   Temp 98.1 F (36.7 C) (Oral)   Resp 14   Ht 5\' 2"  (1.575 m)   Wt 140 lb (63.5 kg)   SpO2 100%   BMI 25.61 kg/m    Assessment & Plan:  1. Mastitis, right, acute Apply warm, moist compresses to left breast has needed. Will start a course of Keflex, which is a breast feeding category B.  - cephALEXin (KEFLEX) 500 MG capsule; Take 1 capsule (500 mg total) by mouth 4 (four) times daily.  Dispense: 28 capsule; Refill: 0 - CBC with Differential  2. Vaginal itching - Wet prep  (BD Affirm) ([place]) - fluconazole (DIFLUCAN) 150 MG tablet; Take 1 tablet (150 mg total) by mouth once.  Dispense: 1 tablet; Refill: 0  3. Dermatitis - triamcinolone cream (KENALOG) 0.1 %; Apply 1 application topically 2 (two) times daily.  Dispense: 30 g; Refill: 2   RTC: Follow up as scheduled. Will follow up by phone with any abnormal laboratory results   Sylvia Pounds  MSN, FNP-C [place], [place] [phone]

## 2017-01-30 LAB — WET PREP  (BD AFFIRM) (~~LOC~~): Wet Prep (BD Affirm): NEGATIVE

## 2017-03-13 ENCOUNTER — Encounter: Payer: Self-pay | Admitting: Family Medicine

## 2017-03-13 ENCOUNTER — Ambulatory Visit (INDEPENDENT_AMBULATORY_CARE_PROVIDER_SITE_OTHER): Payer: Medicaid Other | Admitting: Family Medicine

## 2017-03-13 VITALS — BP 110/76 | HR 76 | Temp 98.3°F | Resp 16 | Ht 62.0 in | Wt 138.0 lb

## 2017-03-13 DIAGNOSIS — K0510 Chronic gingivitis, plaque induced: Secondary | ICD-10-CM | POA: Diagnosis not present

## 2017-03-13 DIAGNOSIS — L700 Acne vulgaris: Secondary | ICD-10-CM | POA: Diagnosis not present

## 2017-03-13 DIAGNOSIS — K0889 Other specified disorders of teeth and supporting structures: Secondary | ICD-10-CM

## 2017-03-13 DIAGNOSIS — R220 Localized swelling, mass and lump, head: Secondary | ICD-10-CM

## 2017-03-13 MED ORDER — ACETAMINOPHEN 500 MG PO TABS: 500.0000 mg | ORAL_TABLET | Freq: Four times a day (QID) | ORAL | 0 refills | Status: DC | PRN

## 2017-03-13 MED ORDER — KETOROLAC TROMETHAMINE 30 MG/ML IJ SOLN
30.0000 mg | Freq: Once | INTRAMUSCULAR | Status: AC
  Administered 2017-03-13: 30 mg via INTRAMUSCULAR

## 2017-03-13 MED ORDER — AMOXICILLIN 500 MG PO CAPS: 500.0000 mg | ORAL_CAPSULE | Freq: Three times a day (TID) | ORAL | 0 refills | Status: AC

## 2017-03-13 MED ORDER — ADAPALENE 0.1 % EX GEL: Freq: Every day | CUTANEOUS | 0 refills | Status: DC

## 2017-03-13 MED FILL — AMOXICILLIN 500 MG CAPSULE: 500 | 7 days supply | Qty: 21 | Fill #0

## 2017-03-13 NOTE — Patient Instructions (Addendum)
Dental pain:  Will send a stat referral to dentist for further evaluation Will start Amoxicillin 500 mg 3 times per day for dental inflammation Apply warm, moist compresses to right face as needed Tylenol 500 mg every 6 hours for moderate to severe pain.    Acne vulgaris:  Clean face twice daily with astringent Refrain from touching face daily Increase water intake to 6-8 glasses per day Apply a thin film to clean/dry skin in the evening before bedtime   Prenatal vitamin without gel at [place] Adapalene skin products What is this medicine? ADAPALENE (a DAP a leen) is applied to the skin to treat mild to moderate acne. This medicine may be used for other purposes; ask your health care provider or pharmacist if you have questions. COMMON BRAND NAME(S): Differin, Differin Pump What should I tell my health care provider before I take this medicine? They need to know if you have any of these conditions: -eczema -seborrheic dermatitis -skin abrasions -sunburn -an unusual or allergic reaction to adapalene, vitamin A, other medicines, foods, dyes, or preservatives -pregnant or trying to get pregnant -breast-feeding How should I use this medicine? This medicine is for external use only, do not take by mouth. Follow the directions on the prescription label. Make sure the skin is clean and dry. Apply just enough to cover the affected area. Rub in gently. Do not get in the eyes, inside the nose, on wounds, or any other sensitive areas of skin. Talk to your pediatrician regarding the use of this medicine in children. Special care may be needed. Overdosage: If you think you have taken too much of this medicine contact a poison control center or emergency room at once. NOTE: This medicine is only for you. Do not share this medicine with others. What if I miss a dose? If you miss a dose, skip that dose and continue with your regular schedule. Do not use extra doses, or use  for a longer period of time than directed by your doctor or health care professional. What may interact with this medicine? -topical antibiotics like clindamycin or erythromycin This list may not describe all possible interactions. Give your health care provider a list of all the medicines, herbs, non-prescription drugs, or dietary supplements you use. Also tell them if you smoke, drink alcohol, or use illegal drugs. Some items may interact with your medicine. What should I watch for while using this medicine? Your acne may get worse at first, and then should start to get better. It may take 2 to 12 weeks before you see the full effect. Do not wash your face more than 3 times a day unless your doctor or health care professional tells you to. Do not use products that may dry the skin like medicated cosmetics, products that contain alcohol, or abrasive soaps or cleaners. Do not use other acne or skin treatment on the same area that you use this medicine unless your doctor or health care professional tells you to. If you use these together they can cause severe skin irritation. This medicine can make you more sensitive to the sun. Keep out of the sun. If you cannot avoid being in the sun, wear protective clothing and use sunscreen. Do not use sun lamps or tanning beds/booths. What side effects may I notice from receiving this medicine? Side effects that you should report to your doctor or health care professional as soon as possible: -allergic reactions like skin rash, itching or hives, swelling of the  face, lips, or tongue -severe burning, reddening, crusting, or swelling of the treated areas Side effects that usually do not require medical attention (report to your doctor or health care professional if they continue or are bothersome): -inflamed, stinging, and irritated skin -skin that peels after a few days of use This list may not describe all possible side effects. Call your doctor for medical  advice about side effects. You may report side effects to FDA at 1-800-FDA-1088. Where should I keep my medicine? Keep out of the reach of children. Store at room temperature between 20 and 25 degrees C (68 and 77 degrees F). Do not freeze. Throw away any unused medicine after the expiration date. NOTE: This sheet is a summary. It may not cover all possible information. If you have questions about this medicine, talk to your doctor, pharmacist, or health care provider.  2018 Elsevier/Gold Standard (2011-11-01 13:12:26)  Acne Acne is a skin problem that causes small, red bumps (pimples). Acne happens when the tiny holes in your skin (pores) get blocked. Your pores may become red, sore, and swollen. They may also become infected. Acne is a common skin problem. It is especially common in teenagers. Acne usually goes away over time. Follow these instructions at home: Good skin care is the most important thing you can do to treat your acne. Take care of your skin as told by your doctor. You may be told to do these things:  Wash your skin gently at least two times each day. You should also wash your skin: ? After you exercise. ? Before you go to bed.  Use mild soap.  Use a water-based skin moisturizer after you wash your skin.  Use a sunscreen or sunblock with SPF 30 or greater. This is very important if you are using acne medicines.  Choose cosmetics that will not plug your oil glands (are noncomedogenic).  Medicines  Take over-the-counter and prescription medicines only as told by your doctor.  If you were prescribed an antibiotic medicine, apply or take it as told by your doctor. Do not stop using the antibiotic even if your acne improves. General instructions  Keep your hair clean and off of your face. Shampoo your hair regularly. If you have oily hair, you may need to wash it every day.  Avoid leaning your chin or forehead on your hands.  Avoid wearing tight headbands or  hats.  Avoid picking or squeezing your pimples. That can make your acne worse and cause scarring.  Keep all follow-up visits as told by your doctor. This is important.  Shave gently. Only shave when it is necessary.  Keep a food journal. This can help you to see if any foods are linked with your acne. Contact a doctor if:  Your acne is not better after eight weeks.  Your acne gets worse.  You have a large area of skin that is red or tender.  You think that you are having side effects from any acne medicine. This information is not intended to replace advice given to you by your health care provider. Make sure you discuss any questions you have with your health care provider. Document Released: 08/22/2011 Document Revised: 02/08/2016 Document Reviewed: 11/09/2014 Elsevier Interactive Patient Education  [employer].

## 2017-03-13 NOTE — Progress Notes (Signed)
[name], a 36 year old female presents complaining of right dental pain for greater than 1 week. Pain is primarily to right side. Pain is interfering with eating and sleep. She has not attempted any OTC interventions to alleviate symptoms.    Dental Pain   This is a new problem. The current episode started 1 to 4 weeks ago. The problem occurs constantly. The problem has been rapidly worsening. The pain is at a severity of 9/10. The pain is severe. Associated symptoms include facial pain (right) and thermal sensitivity. Pertinent negatives include no difficulty swallowing, fever, oral bleeding or sinus pressure. She has tried nothing for the symptoms.   Past Medical History:  Diagnosis Date  . Asthma   . Diabetes mellitus    gestational  . GERD (gastroesophageal reflux disease)    no meds  . Gestational diabetes    Social History   Social History  . Marital status: Married    Spouse name: N/A  . Number of children: N/A  . Years of education: N/A   Occupational History  . Not on file.   Social History Main Topics  . Smoking status: Never Smoker  . Smokeless tobacco: Never Used  . Alcohol use No  . Drug use: No  . Sexual activity: Yes    Birth control/ protection: None   Other Topics Concern  . Not on file   Social History Narrative  . No narrative on file   No Known Allergies Immunization History  Administered Date(s) Administered  . Tdap 02/15/2016    Review of Systems  Constitutional: Negative for fever.  HENT: Negative for hearing loss and sinus pressure.        Dental pain Right Facial swelling  Eyes: Negative.   Respiratory: Negative.   Musculoskeletal: Negative.   Skin:       Facial acne  Neurological: Negative.  Negative for headaches.  Endo/Heme/Allergies: Negative.   Psychiatric/Behavioral: Negative.   Physical Exam  Constitutional: She appears distressed.  HENT:  Mouth/Throat: Abnormal dentition. Dental abscesses and dental caries present.     Right facial swelling  Erythema and swelling to right upper gums  Pulmonary/Chest: Effort normal and breath sounds normal.  Abdominal: Soft. Bowel sounds are normal. She exhibits no distension. There is no tenderness.  Lymphadenopathy:       Head (right side): No submental, no submandibular, no preauricular and no posterior auricular adenopathy present.       Head (left side): No submental, no submandibular, no preauricular and no posterior auricular adenopathy present.  Neurological: She is alert. Gait normal.  Skin:  Open and closed comedones to face   Plan   1. Pain, dental Will send a stat referral to dentist for further evaluation Will start Amoxicillin 500 mg 3 times per day for dental inflammation Apply warm, moist compresses to right face as needed Tylenol 500 mg every 6 hours for moderate to severe pain.  - ketorolac (TORADOL) 30 MG/ML injection 30 mg; Inject 1 mL (30 mg total) into the muscle once. - acetaminophen (TYLENOL) 500 MG tablet; Take 1 tablet (500 mg total) by mouth every 6 (six) hours as needed.  Dispense: 30 tablet; Refill: 0 - Ambulatory referral to Dentistry  2. Gum inflammation - ketorolac (TORADOL) 30 MG/ML injection 30 mg; Inject 1 mL (30 mg total) into the muscle once. - acetaminophen (TYLENOL) 500 MG tablet; Take 1 tablet (500 mg total) by mouth every 6 (six) hours as needed.  Dispense: 30 tablet; Refill: 0 -  amoxicillin (AMOXIL) 500 MG capsule; Take 1 capsule (500 mg total) by mouth 3 (three) times daily.  Dispense: 21 capsule; Refill: 0 - Ambulatory referral to Dentistry  3. Swelling of right side of face - amoxicillin (AMOXIL) 500 MG capsule; Take 1 capsule (500 mg total) by mouth 3 (three) times daily.  Dispense: 21 capsule; Refill: 0 - Ambulatory referral to Dentistry  4. Acne vulgaris Clean face twice daily with astringent Refrain from touching face daily Increase water intake to 6-8 glasses per day Apply a thin film to clean/dry skin in  the evening before bedtime - adapalene (DIFFERIN) 0.1 % gel; Apply topically at bedtime.  Dispense: 45 g; Refill: 0  RTC: Follow up as needed.   [name]  MSN, FNP-C [facility] [address] [place], [place] [address] [phone]

## 2017-05-08 IMAGING — US US MFM OB FOLLOW-UP
1 series · 14 of 28 positions shown · non-contrast
Comparison: none

[Series 1: us mfm ob follow-up · 58 acquisitions, 14 frames shown]
[im 3/58]
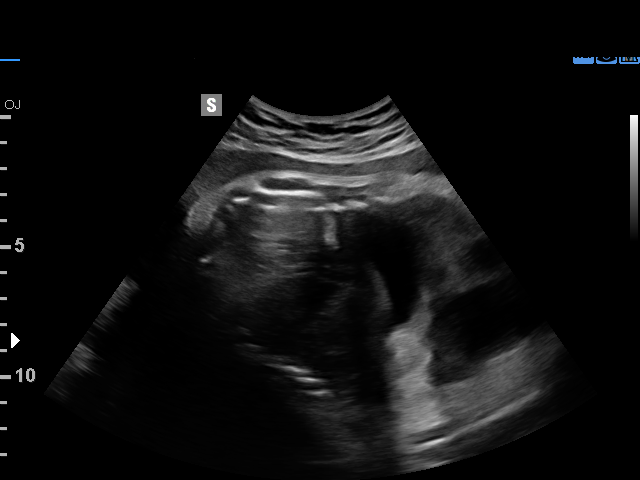
[im 7/58]
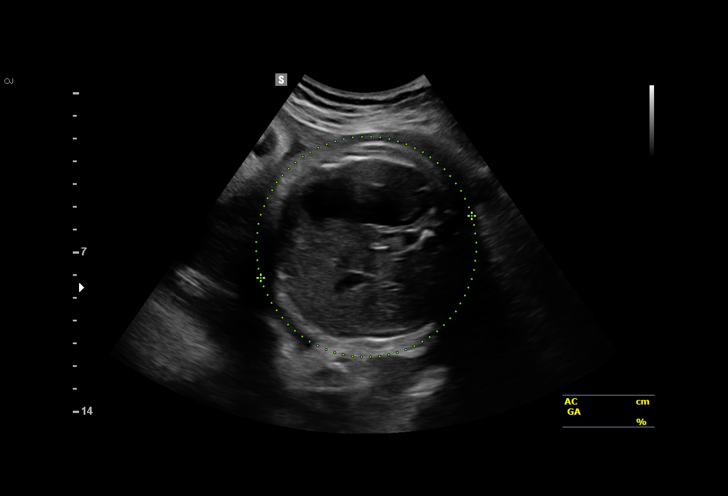
[im 11/58]
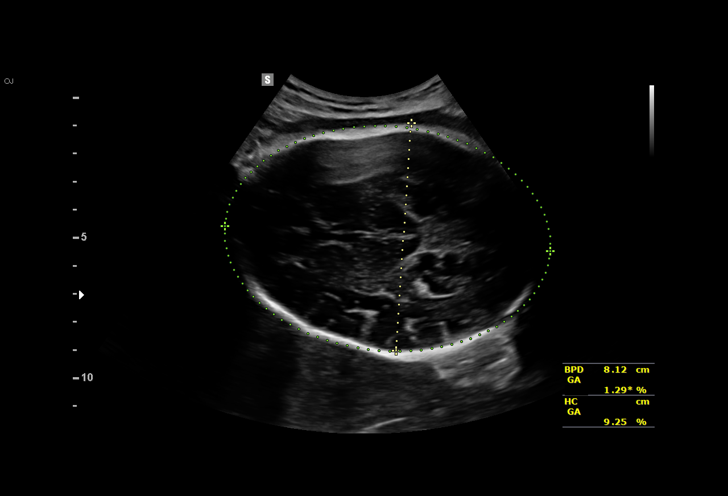
[im 15/58]
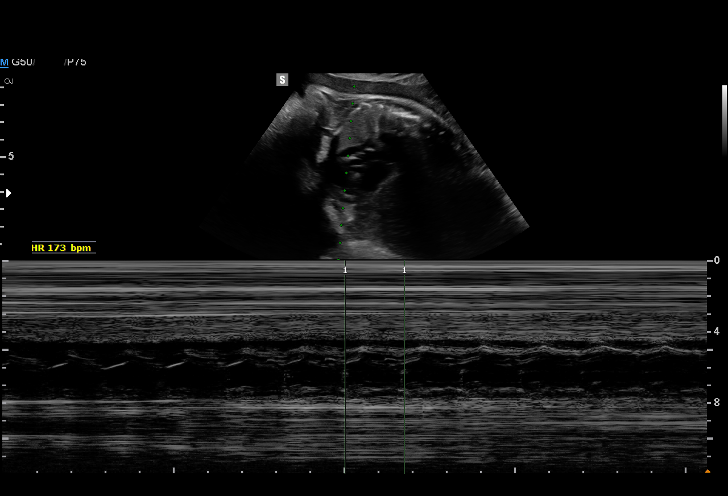
[im 20/58]
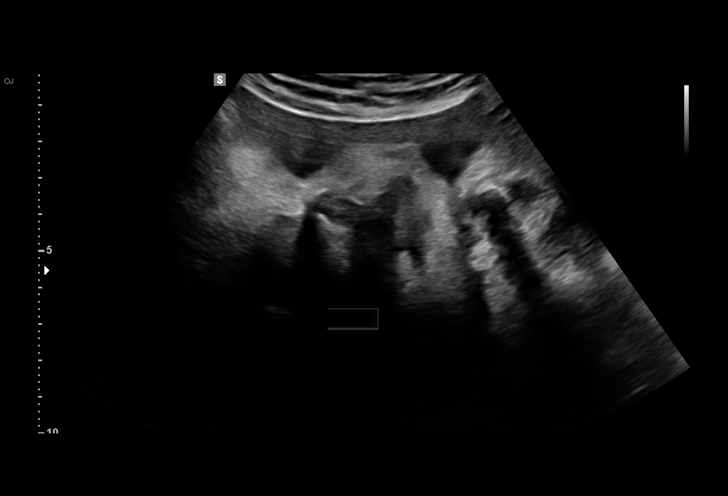
[im 24/58]
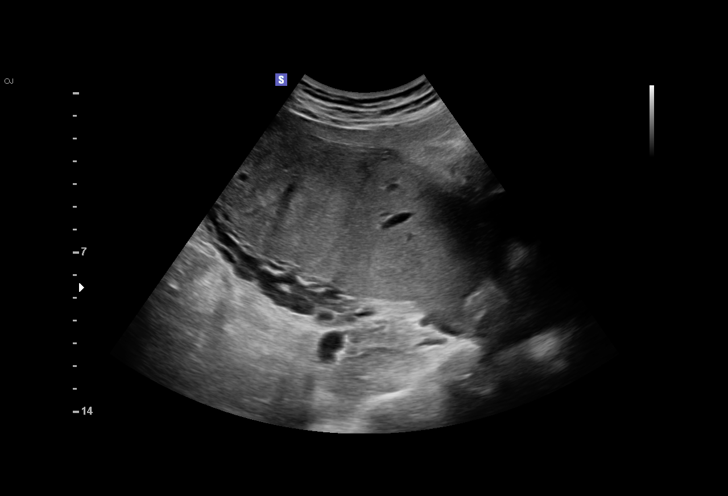
[im 28/58]
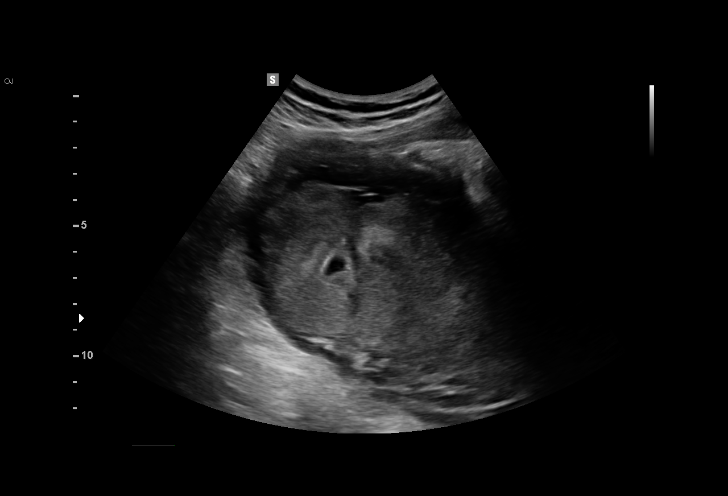
[im 32/58]
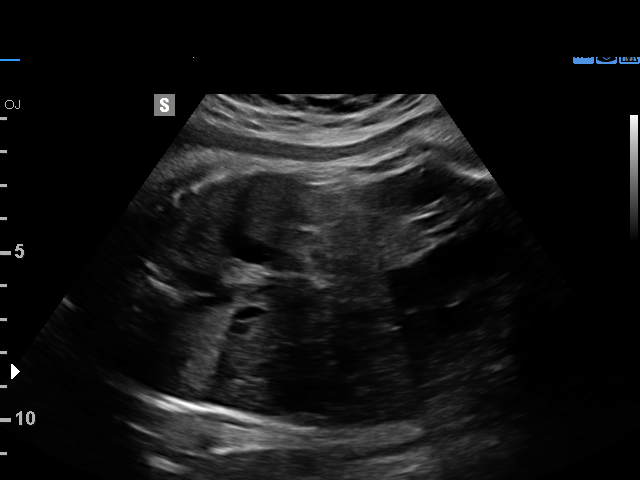
[im 36/58]
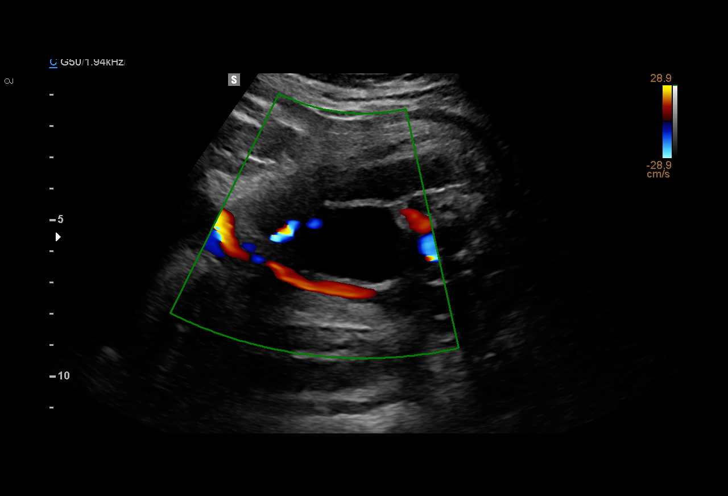
[im 41/58]
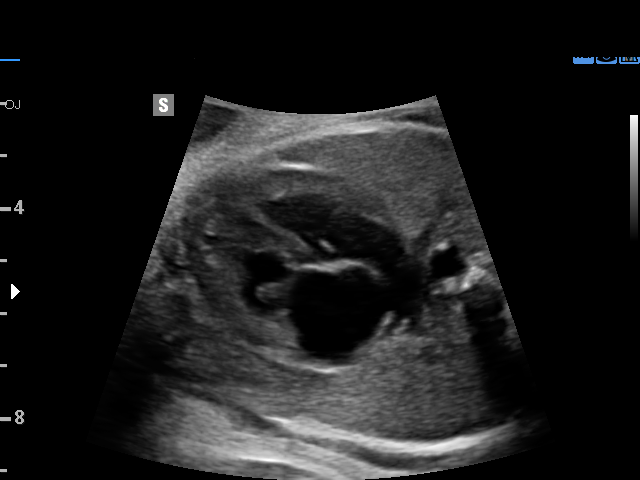
[im 45/58]
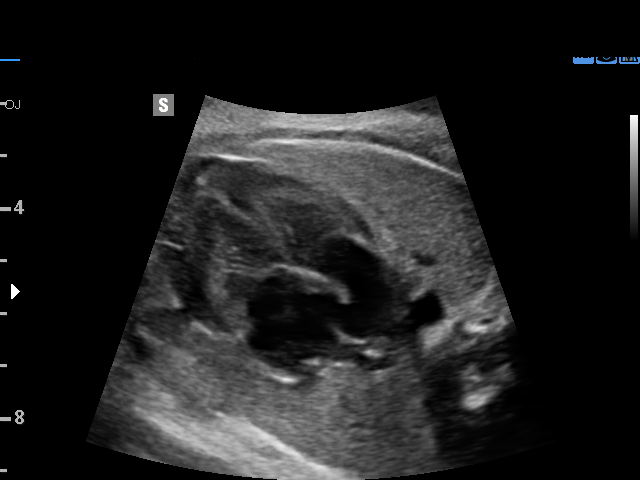
[im 49/58]
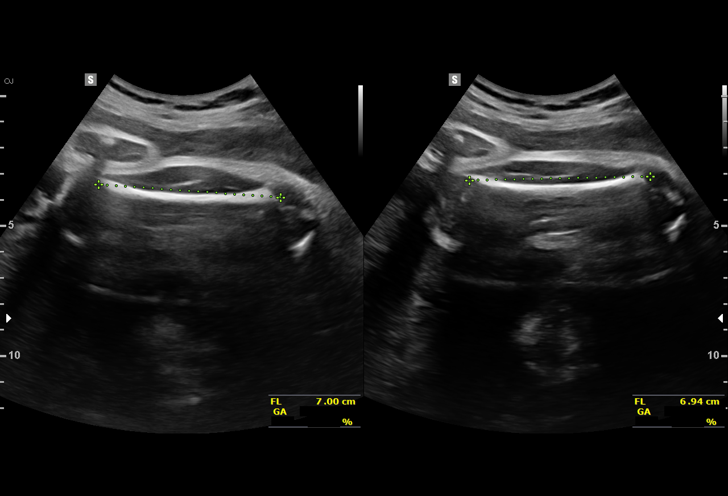
[im 53/58]
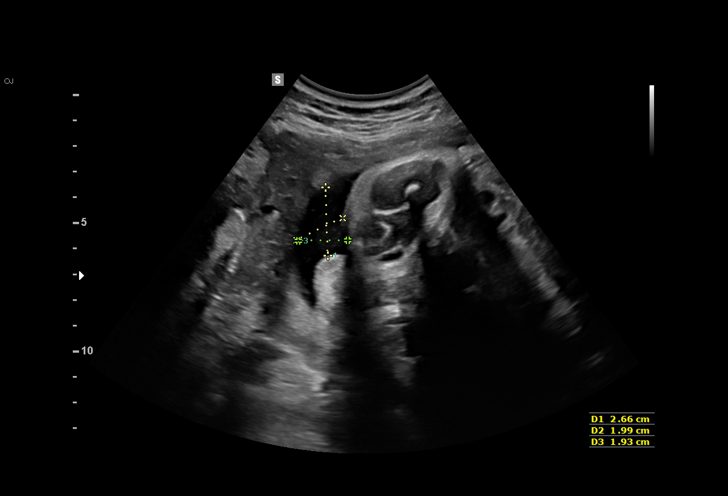
[im 58/58]
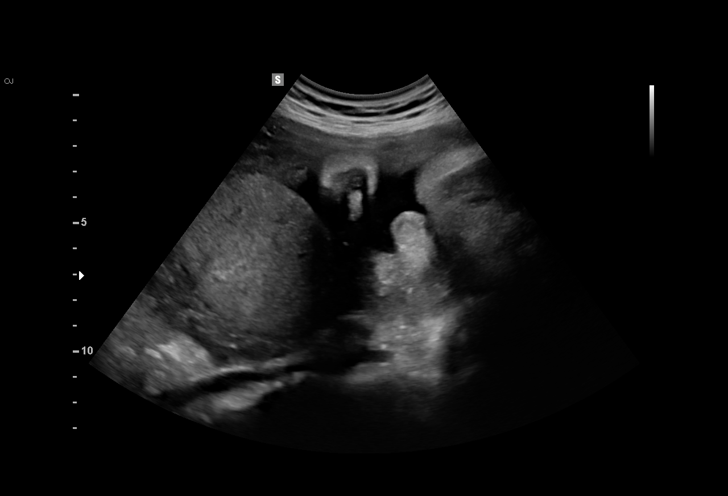

[14 of 28 positions shown; findings below may reference images not displayed]

[facility]-
Faculty Physician
OB/Gyn Clinic

1  HUYNH BELONY              [id number]      [id number]     [id number]
Indications

35 weeks gestation of pregnancy
Advanced maternal age multigravida 35+,
third trimester -neg MARIVIC
History of cesarean delivery, currently
pregnant
Poor obstetric history: Previous gestational
diabetes
Gestational diabetes in pregnancy,
controlled by oral hypoglycemic drugs;
glyburide
OB History

Gravidity:    4         Term:   1         SAB:   2
Living:       2
Fetal Evaluation

Num Of Fetuses:     1
Fetal Heart         173
Rate(bpm):
Cardiac Activity:   Observed
Presentation:       Breech/transverse
Placenta:           Posterior, above cervical os
P. Cord Insertion:  Previously Visualized
Amniotic Fluid
AFI FV:      Subjectively, low normal

AFI Sum(cm)     %Tile       Largest Pocket(cm)
7.43            4

RUQ(cm)       RLQ(cm)                      LLQ(cm)
3.75
Biometry

BPD:      81.3  mm     G. Age:  32w 5d          1  %    CI:        69.88   %   70 - 86
FL/HC:      22.4   %   20.1 -
HC:      310.3  mm     G. Age:  34w 4d          6  %    HC/AC:      1.00       0.93 -
AC:      310.6  mm     G. Age:  35w 0d         39  %    FL/BPD:     85.4   %   71 - 87
FL:       69.4  mm     G. Age:  35w 4d         42  %    FL/AC:      22.3   %   20 - 24
HUM:      60.5  mm     G. Age:  35w 1d         51  %

Est. FW:    1743  gm    5 lb 10 oz      44  %
Gestational Age

LMP:           35w 5d       Date:   07/07/15                 EDD:   04/12/16
U/S Today:     34w 3d                                        EDD:   04/21/16
Best:          35w 5d    Det. By:   LMP  (07/07/15)          EDD:   04/12/16
Anatomy

Cranium:               Appears normal         Aortic Arch:            Previously seen
Cavum:                 Appears normal         Ductal Arch:            Previously seen
Ventricles:            Appears normal         Diaphragm:              Previously seen
Choroid Plexus:        Previously seen        Stomach:                Appears normal, left
sided
Cerebellum:            Previously seen        Abdomen:                Appears normal
Posterior Fossa:       Appears normal         Abdominal Wall:         Previously seen
Nuchal Fold:           Not applicable (>20    Cord Vessels:           Appears normal (3
wks GA)                                        vessel cord)
Face:                  Orbits and profile     Kidneys:                Appear normal
previously seen
Lips:                  Previously seen        Bladder:                Appears normal
Thoracic:              Appears normal         Spine:                  Previously seen
Heart:                 Appears normal         Upper Extremities:      Previously seen
(4CH, axis, and
situs)
RVOT:                  Previously seen        Lower Extremities:      Previously seen
LVOT:                  Previously seen

Other:  Nasal bone previously seen. Fetus appears to be a male. Heels and
5th digit previously seen. Technically difficult due to fetal position.
Cervix Uterus Adnexa

Cervix
Not visualized (advanced GA >26wks)

Left Ovary
Not visualized. No adnexal mass visualized.

Right Ovary
Not visualized. No adnexal mass visualized.
Impression

SIUP at 35+5 weeks
Breech presentation
Normal interval anatomy; anatomic survey complete
Low normal amniotic fluid volume
Appropriate interval growth with EFW at the 44th %tile
Recommendations

Continue twice weekly NSTs with weekly AFIs

## 2017-10-13 ENCOUNTER — Ambulatory Visit (INDEPENDENT_AMBULATORY_CARE_PROVIDER_SITE_OTHER): Payer: Medicaid Other | Admitting: Family Medicine

## 2017-10-13 VITALS — BP 119/81 | HR 99 | Temp 98.2°F | Resp 16 | Ht 62.0 in | Wt 137.0 lb

## 2017-10-13 DIAGNOSIS — R102 Pelvic and perineal pain: Secondary | ICD-10-CM | POA: Diagnosis not present

## 2017-10-13 DIAGNOSIS — J3489 Other specified disorders of nose and nasal sinuses: Secondary | ICD-10-CM

## 2017-10-13 DIAGNOSIS — M25562 Pain in left knee: Secondary | ICD-10-CM

## 2017-10-13 DIAGNOSIS — M25561 Pain in right knee: Secondary | ICD-10-CM | POA: Diagnosis not present

## 2017-10-13 DIAGNOSIS — D259 Leiomyoma of uterus, unspecified: Secondary | ICD-10-CM

## 2017-10-13 DIAGNOSIS — Z789 Other specified health status: Secondary | ICD-10-CM

## 2017-10-13 DIAGNOSIS — N941 Unspecified dyspareunia: Secondary | ICD-10-CM

## 2017-10-13 DIAGNOSIS — R319 Hematuria, unspecified: Secondary | ICD-10-CM

## 2017-10-13 DIAGNOSIS — J309 Allergic rhinitis, unspecified: Secondary | ICD-10-CM

## 2017-10-13 DIAGNOSIS — R0982 Postnasal drip: Secondary | ICD-10-CM

## 2017-10-13 LAB — POCT URINALYSIS DIP (DEVICE)
Bilirubin Urine: NEGATIVE
GLUCOSE, UA: NEGATIVE mg/dL
KETONES UR: NEGATIVE mg/dL
Leukocytes, UA: NEGATIVE
Nitrite: NEGATIVE
Protein, ur: NEGATIVE mg/dL
SPECIFIC GRAVITY, URINE: 1.025 (ref 1.005–1.030)
Urobilinogen, UA: 0.2 mg/dL (ref 0.0–1.0)
pH: 5.5 (ref 5.0–8.0)

## 2017-10-13 MED ORDER — ACETAMINOPHEN-CODEINE #3 300-30 MG PO TABS: 1.0000 | ORAL_TABLET | Freq: Four times a day (QID) | ORAL | 0 refills | Status: DC | PRN

## 2017-10-13 MED ORDER — OXYMETAZOLINE HCL 0.05 % NA SOLN: 1.0000 | Freq: Two times a day (BID) | NASAL | 0 refills | Status: AC

## 2017-10-13 MED ORDER — KETOROLAC TROMETHAMINE 30 MG/ML IJ SOLN
30.0000 mg | Freq: Once | INTRAMUSCULAR | Status: AC
  Administered 2017-10-13: 30 mg via INTRAMUSCULAR

## 2017-10-13 MED ORDER — CETIRIZINE HCL 10 MG PO TABS: 10.0000 mg | ORAL_TABLET | Freq: Every day | ORAL | 11 refills | Status: DC

## 2017-10-13 NOTE — Patient Instructions (Signed)
Afrin 1 spray to each nare twice daily for 3 days.  Recommend cetirizine 10 mg daily  Also, recommend OTC Tylenol 500 mg every 6 hours for mild to moderate headache pain.  For pelvic pain, recommend a referral to gynecology for further workup and evaluation   Pelvic Pain, Female Pelvic pain is pain in your lower belly (abdomen), below your belly button and between your hips. The pain may start suddenly (acute), keep coming back (recurring), or last a long time (chronic). Pelvic pain that lasts longer than six months is considered chronic. There are many causes of pelvic pain. Sometimes the cause of your pelvic pain is not known. Follow these instructions at home:  Take over-the-counter and prescription medicines only as told by your doctor.  Rest as told by your doctor.  Do not have sex it if hurts.  Keep a journal of your pelvic pain. Write down: ? When the pain started. ? Where the pain is located. ? What seems to make the pain better or worse, such as food or your menstrual cycle. ? Any symptoms you have along with the pain.  Keep all follow-up visits as told by your doctor. This is important. Contact a doctor if:  Medicine does not help your pain.  Your pain comes back.  You have new symptoms.  You have unusual vaginal discharge or bleeding.  You have a fever or chills.  You are having a hard time pooping (constipation).  You have blood in your pee (urine) or poop (stool).  Your pee smells bad.  You feel weak or lightheaded. Get help right away if:  You have sudden pain that is very bad.  Your pain continues to get worse.  You have very bad pain and also have any of the following symptoms: ? A fever. ? Feeling stick to your stomach (nausea). ? Throwing up (vomiting). ? Being very sweaty.  You pass out (lose consciousness). This information is not intended to replace advice given to you by your health care provider. Make sure you discuss any questions you have  with your health care provider. Document Released: 02/19/2008 Document Revised: 09/27/2015 Document Reviewed: 06/23/2015 Elsevier Interactive Patient Education  2018 [employer]. Allergic Rhinitis, Adult Allergic rhinitis is an allergic reaction that affects the mucous membrane inside the nose. It causes sneezing, a runny or stuffy nose, and the feeling of mucus going down the back of the throat (postnasal drip). Allergic rhinitis can be mild to severe. There are two types of allergic rhinitis:  Seasonal. This type is also called hay fever. It happens only during certain seasons.  Perennial. This type can happen at any time of the year.  What are the causes? This condition happens when the body's defense system (immune system) responds to certain harmless substances called allergens as though they were germs.  Seasonal allergic rhinitis is triggered by pollen, which can come from grasses, trees, and weeds. Perennial allergic rhinitis may be caused by:  House dust mites.  Pet dander.  Mold spores.  What are the signs or symptoms? Symptoms of this condition include:  Sneezing.  Runny or stuffy nose (nasal congestion).  Postnasal drip.  Itchy nose.  Tearing of the eyes.  Trouble sleeping.  Daytime sleepiness.  How is this diagnosed? This condition may be diagnosed based on:  Your medical history.  A physical exam.  Tests to check for related conditions, such as: ? Asthma. ? Pink eye. ? Ear infection. ? Upper respiratory infection.  Tests to  find out which allergens trigger your symptoms. These may include skin or blood tests.  How is this treated? There is no cure for this condition, but treatment can help control symptoms. Treatment may include:  Taking medicines that block allergy symptoms, such as antihistamines. Medicine may be given as a shot, nasal spray, or pill.  Avoiding the allergen.  Desensitization. This treatment involves getting ongoing shots  until your body becomes less sensitive to the allergen. This treatment may be done if other treatments do not help.  If taking medicine and avoiding the allergen does not work, new, stronger medicines may be prescribed.  Follow these instructions at home:  Find out what you are allergic to. Common allergens include smoke, dust, and pollen.  Avoid the things you are allergic to. These are some things you can do to help avoid allergens: ? Replace carpet with wood, tile, or vinyl flooring. Carpet can trap dander and dust. ? Do not smoke. Do not allow smoking in your home. ? Change your heating and air conditioning filter at least once a month. ? During allergy season:  Keep windows closed as much as possible.  Plan outdoor activities when pollen counts are lowest. This is usually during the evening hours.  When coming indoors, change clothing and shower before sitting on furniture or bedding.  Take over-the-counter and prescription medicines only as told by your health care provider.  Keep all follow-up visits as told by your health care provider. This is important. Contact a health care provider if:  You have a fever.  You develop a persistent cough.  You make whistling sounds when you breathe (you wheeze).  Your symptoms interfere with your normal daily activities. Get help right away if:  You have shortness of breath. Summary  This condition can be managed by taking medicines as directed and avoiding allergens.  Contact your health care provider if you develop a persistent cough or fever.  During allergy season, keep windows closed as much as possible. This information is not intended to replace advice given to you by your health care provider. Make sure you discuss any questions you have with your health care provider. Document Released: 05/28/2001 Document Revised: 10/10/2016 Document Reviewed: 10/10/2016 Elsevier Interactive Patient Education  2018 [employer]. Sinus  Headache A sinus headache happens when your sinuses become clogged or swollen. You may feel pain or pressure in your face, forehead, ears, or upper teeth. Sinus headaches can be mild or severe. Follow these instructions at home:  Take medicines only as told by your doctor.  If you were given an antibiotic medicine, finish all of it even if you start to feel better.  Use a nose spray if you feel stuffed up (congested).  If told, apply a warm, moist washcloth to your face to help lessen pain. Contact a doctor if:  You get headaches more than one time each week.  Light or sound bothers you.  You have a fever.  You feel sick to your stomach (nauseous) or you throw up (vomit).  Your headaches do not get better with treatment. Get help right away if:  You have trouble seeing.  You suddenly have very bad pain in your face or head.  You start to twitch or shake (seizure).  You are confused.  You have a stiff neck. This information is not intended to replace advice given to you by your health care provider. Make sure you discuss any questions you have with your health care provider. Document  Released: 01/02/2011 Document Revised: 04/28/2016 Document Reviewed: 08/29/2014 Elsevier Interactive Patient Education  [employer].

## 2017-10-13 NOTE — Progress Notes (Signed)
Subjective:    Patient ID: Sylvia Best, female    DOB: 18-Oct-1980, 37 y.o.   MRN: [id number]  HPI Sylvia Best, a 37 year old female presents complaining of right pelvic pain and sinus pain and pressure. Patient primarily speaks Arabic, utilizing video interpreter to assist with communication. She is complaining of pelvic pain, primarily to right lower quadrant over the past several weeks. Patient underwent a pelvic and transvaginal ultrasound on 11/13/1016, which showed a probable small uterine fibroid. Current pain intensity is 5-6/10 described as intermittent and sharp. Patient is sexually active and complains of pain during sexual intercourse. She denies abnormal vaginal bleeding or vaginal discharge. Patient has nexplanon for birth control. Nexplanon was placed by [facility] greater than 1 year ago. Patient does not have a menstrual cycle.   Patient is also complaining of sinus pain and sinus pressure over the past 2 weeks. Symptoms also include itching to ears and periodic sore throat. Patient has attempted OTC Ibuprofen without sustained relief.  Past Medical History:  Diagnosis Date  . Asthma   . Diabetes mellitus    gestational  . GERD (gastroesophageal reflux disease)    no meds  . Gestational diabetes    Allergies as of 10/13/2017   No Known Allergies     Medication List        Accurate as of 10/13/17 11:59 PM. Always use your most recent med list.          acetaminophen 500 MG tablet Commonly known as:  TYLENOL Take 1 tablet (500 mg total) by mouth every 6 (six) hours as needed.   acetaminophen-codeine 300-30 MG tablet Commonly known as:  TYLENOL #3 Take 1 tablet by mouth every 6 (six) hours as needed for moderate pain.   cetirizine 10 MG tablet Commonly known as:  ZYRTEC Take 1 tablet (10 mg total) by mouth daily.   ibuprofen 600 MG tablet Commonly known as:  ADVIL,MOTRIN Take 1 tablet (600 mg total) by mouth every 8 (eight) hours as  needed.   NEXPLANON 68 MG Impl implant Generic drug:  etonogestrel 1 each by Subdermal route continuous.   oxymetazoline 0.05 % nasal spray Commonly known as:  AFRIN Place 1 spray into both nostrils 2 (two) times daily for 3 days.      Social History   Socioeconomic History  . Marital status: Married    Spouse name: Not on file  . Number of children: Not on file  . Years of education: Not on file  . Highest education level: Not on file  Social Needs  . Financial resource strain: Not on file  . Food insecurity - worry: Not on file  . Food insecurity - inability: Not on file  . Transportation needs - medical: Not on file  . Transportation needs - non-medical: Not on file  Occupational History  . Not on file  Tobacco Use  . Smoking status: Never Smoker  . Smokeless tobacco: Never Used  Substance and Sexual Activity  . Alcohol use: No  . Drug use: No  . Sexual activity: Yes    Birth control/protection: None  Other Topics Concern  . Not on file  Social History Narrative  . Not on file   Review of Systems  Constitutional: Positive for fatigue. Negative for fever and unexpected weight change.  HENT: Positive for postnasal drip, rhinorrhea, sinus pressure and sinus pain.   Eyes: Negative.   Respiratory: Negative.   Cardiovascular: Negative.   Gastrointestinal: Negative.  Endocrine: Negative.   Genitourinary: Positive for pelvic pain. Negative for frequency and hematuria.  Musculoskeletal: Negative.   Skin:       Bilateral knee pain  Allergic/Immunologic: Negative.   Psychiatric/Behavioral: Negative.        Objective:   Physical Exam  Constitutional: She is oriented to person, place, and time. She appears well-developed.  HENT:  Nose: Mucosal edema present. Right sinus exhibits maxillary sinus tenderness. Left sinus exhibits no maxillary sinus tenderness.  TM dull bilaterally  Eyes: Pupils are equal, round, and reactive to light.  Cardiovascular: Normal rate,  regular rhythm, normal heart sounds and intact distal pulses.  Pulmonary/Chest: Effort normal and breath sounds normal.  Abdominal: Soft. Bowel sounds are normal. There is tenderness in the right lower quadrant. There is no CVA tenderness.    Right pelvic pain   Musculoskeletal:       Right knee: She exhibits normal range of motion and no swelling.       Left knee: She exhibits normal range of motion and no swelling.  Neurological: She is alert and oriented to person, place, and time.  Skin: Skin is warm and dry.  Psychiatric: She has a normal mood and affect. Her behavior is normal. Judgment and thought content normal.         BP 119/81 (BP Location: Right Arm, Patient Position: Sitting, Cuff Size: Normal)   Pulse 99   Temp 98.2 F (36.8 C) (Oral)   Resp 16   Ht 5\' 2"  (1.575 m)   Wt 137 lb (62.1 kg)   SpO2 100%   BMI 25.06 kg/m   Assessment & Plan:  1. Uterine leiomyoma, unspecified location Will defer to gynecology for further treatment and evaluation of this problem.  - Ambulatory referral to Gynecology  2. Pelvic pain Patient has been taking OTC Ibuprofen. Recommend that patient refrain from taking Ibuprofen due to nephrotoxicity. Will start a Tylenol #3 eery 6 hours as needed for moderate to severe pain. Discussed medication and potential s/e at length.  - POCT urinalysis dip (device) - POCT urine pregnancy - Ambulatory referral to Gynecology - acetaminophen-codeine (TYLENOL #3) 300-30 MG tablet; Take 1 tablet by mouth every 6 (six) hours as needed for moderate pain.  Dispense: 15 tablet; Refill: 0  3. Hematuria, unspecified type - Urine Culture - Ambulatory referral to Gynecology  4. Dyspareunia in female - POCT urinalysis dip (device) - Ambulatory referral to Gynecology  5. Language barrier to communication Patient speaks Arabic, utilized video interpreter to assist with communication  6. Sinus pain - oxymetazoline (AFRIN) 0.05 % nasal spray; Place 1 spray  into both nostrils 2 (two) times daily for 3 days.  Dispense: 30 mL; Refill: 0 - cetirizine (ZYRTEC) 10 MG tablet; Take 1 tablet (10 mg total) by mouth daily.  Dispense: 30 tablet; Refill: 11  7. Sinus pressure - oxymetazoline (AFRIN) 0.05 % nasal spray; Place 1 spray into both nostrils 2 (two) times daily for 3 days.  Dispense: 30 mL; Refill: 0 - cetirizine (ZYRTEC) 10 MG tablet; Take 1 tablet (10 mg total) by mouth daily.  Dispense: 30 tablet; Refill: 11  8. Post-nasal drip - oxymetazoline (AFRIN) 0.05 % nasal spray; Place 1 spray into both nostrils 2 (two) times daily for 3 days.  Dispense: 30 mL; Refill: 0  9. Acute pain of both knees - ketorolac (TORADOL) 30 MG/ML injection 30 mg  10. Allergic rhinitis, unspecified seasonality, unspecified trigger Refer to #7   RTC: As previously scheduled  The patient was given clear instructions to go to ER or return to medical center if symptoms do not improve, worsen or new problems develop. The patient verbalized understanding.   [name]  MSN, FNP-C [facility] [address] [place], [place] [address] [phone]

## 2017-10-15 LAB — URINE CULTURE: Organism ID, Bacteria: NO GROWTH

## 2017-10-17 ENCOUNTER — Encounter: Payer: Self-pay | Admitting: Family Medicine

## 2017-10-17 LAB — POCT URINE PREGNANCY: Preg Test, Ur: NEGATIVE

## 2017-12-16 ENCOUNTER — Encounter: Payer: Self-pay | Admitting: *Deleted

## 2017-12-29 ENCOUNTER — Other Ambulatory Visit: Payer: Self-pay | Admitting: Family Medicine

## 2017-12-29 ENCOUNTER — Ambulatory Visit (HOSPITAL_COMMUNITY)
Admission: RE | Admit: 2017-12-29 | Discharge: 2017-12-29 | Disposition: A | Discharge status: Home / Self Care | Payer: Medicaid Other | Source: Ambulatory Visit | Attending: Family Medicine | Admitting: Family Medicine

## 2017-12-29 ENCOUNTER — Ambulatory Visit (INDEPENDENT_AMBULATORY_CARE_PROVIDER_SITE_OTHER): Payer: Self-pay | Admitting: Family Medicine

## 2017-12-29 VITALS — BP 101/69 | HR 108 | Temp 98.2°F | Resp 16 | Ht 62.0 in | Wt 136.0 lb

## 2017-12-29 DIAGNOSIS — M25461 Effusion, right knee: Secondary | ICD-10-CM | POA: Insufficient documentation

## 2017-12-29 DIAGNOSIS — G8929 Other chronic pain: Secondary | ICD-10-CM

## 2017-12-29 DIAGNOSIS — J3489 Other specified disorders of nose and nasal sinuses: Secondary | ICD-10-CM

## 2017-12-29 DIAGNOSIS — M25561 Pain in right knee: Secondary | ICD-10-CM

## 2017-12-29 DIAGNOSIS — S80851A Superficial foreign body, right lower leg, initial encounter: Secondary | ICD-10-CM | POA: Insufficient documentation

## 2017-12-29 DIAGNOSIS — D259 Leiomyoma of uterus, unspecified: Secondary | ICD-10-CM

## 2017-12-29 DIAGNOSIS — X58XXXA Exposure to other specified factors, initial encounter: Secondary | ICD-10-CM | POA: Insufficient documentation

## 2017-12-29 DIAGNOSIS — M769 Unspecified enthesopathy, lower limb, excluding foot: Secondary | ICD-10-CM | POA: Insufficient documentation

## 2017-12-29 DIAGNOSIS — R52 Pain, unspecified: Secondary | ICD-10-CM

## 2017-12-29 DIAGNOSIS — R5383 Other fatigue: Secondary | ICD-10-CM

## 2017-12-29 DIAGNOSIS — R509 Fever, unspecified: Secondary | ICD-10-CM

## 2017-12-29 LAB — POCT URINALYSIS DIPSTICK
Bilirubin, UA: NEGATIVE
Glucose, UA: NEGATIVE
NITRITE UA: NEGATIVE
PH UA: 6 (ref 5.0–8.0)
Spec Grav, UA: 1.025 (ref 1.010–1.025)
UROBILINOGEN UA: 0.2 U/dL

## 2017-12-29 LAB — POCT INFLUENZA A/B
INFLUENZA A, POC: NEGATIVE
Influenza B, POC: NEGATIVE

## 2017-12-29 MED ORDER — CETIRIZINE HCL 10 MG PO TABS: 10.0000 mg | ORAL_TABLET | Freq: Every day | ORAL | 11 refills | Status: DC

## 2017-12-29 MED ORDER — MELOXICAM 7.5 MG PO TABS: 7.5000 mg | ORAL_TABLET | Freq: Every day | ORAL | 1 refills | Status: DC

## 2017-12-29 MED ORDER — KETOROLAC TROMETHAMINE 60 MG/2ML IM SOLN
60.0000 mg | Freq: Once | INTRAMUSCULAR | Status: AC
  Administered 2017-12-29: 60 mg via INTRAMUSCULAR

## 2017-12-29 MED FILL — MELOXICAM 7.5 MG TABLET: 7.5 | 30 days supply | Qty: 30 | Fill #0

## 2017-12-29 NOTE — Patient Instructions (Signed)
For chronic knee pain, primarily to right and right heel pain we will start a trial of meloxicam 7.5 mg daily.  Please refrain from taking over-the-counter NSAIDs along with this medication to prevent nephrotoxicity or GI upset.  I also recommend that you take this medication with food.  I will follow-up by phone after reviewing right knee x-ray.  I will also send a referral to an orthopedic specialist for further evaluation of this problem. For generalized body aches will test for influenza AMB.  I recommend that you increase rest, handwashing, and fluid intake over the next several days. You also have environmental allergies.  I recommend that you continue cetirizine 10 mg at bedtime for symptoms of allergic rhinitis.  I recommend that you continue this medication throughout pollen season.  And avoid going outside during times of high pollen index.

## 2017-12-30 ENCOUNTER — Other Ambulatory Visit: Payer: Self-pay | Admitting: Family Medicine

## 2017-12-30 ENCOUNTER — Telehealth: Payer: Self-pay

## 2017-12-30 DIAGNOSIS — G8929 Other chronic pain: Secondary | ICD-10-CM | POA: Insufficient documentation

## 2017-12-30 DIAGNOSIS — R936 Abnormal findings on diagnostic imaging of limbs: Secondary | ICD-10-CM | POA: Insufficient documentation

## 2017-12-30 DIAGNOSIS — M25561 Pain in right knee: Principal | ICD-10-CM

## 2017-12-30 DIAGNOSIS — M25562 Pain in left knee: Secondary | ICD-10-CM | POA: Insufficient documentation

## 2017-12-30 LAB — COMPREHENSIVE METABOLIC PANEL
A/G RATIO: 1.8 (ref 1.2–2.2)
ALK PHOS: 55 IU/L (ref 39–117)
ALT: 10 IU/L (ref 0–32)
AST: 12 IU/L (ref 0–40)
Albumin: 4.2 g/dL (ref 3.5–5.5)
BILIRUBIN TOTAL: 0.4 mg/dL (ref 0.0–1.2)
BUN/Creatinine Ratio: 10 (ref 9–23)
BUN: 6 mg/dL (ref 6–20)
CHLORIDE: 103 mmol/L (ref 96–106)
CO2: 21 mmol/L (ref 20–29)
Calcium: 8.4 mg/dL — ABNORMAL LOW (ref 8.7–10.2)
Creatinine, Ser: 0.6 mg/dL (ref 0.57–1.00)
GFR calc Af Amer: 135 mL/min/{1.73_m2} (ref >59)
GFR, EST NON AFRICAN AMERICAN: 117 mL/min/{1.73_m2} (ref >59)
GLOBULIN, TOTAL: 2.4 g/dL (ref 1.5–4.5)
Glucose: 115 mg/dL — ABNORMAL HIGH (ref 65–99)
POTASSIUM: 3.4 mmol/L — AB (ref 3.5–5.2)
SODIUM: 139 mmol/L (ref 134–144)
Total Protein: 6.6 g/dL (ref 6.0–8.5)

## 2017-12-30 LAB — CBC WITH DIFFERENTIAL/PLATELET
Basophils Absolute: 0 x10E3/uL (ref 0.0–0.2)
Basos: 0 %
EOS (ABSOLUTE): 0.1 x10E3/uL (ref 0.0–0.4)
Eos: 4 %
Hematocrit: 40.1 % (ref 34.0–46.6)
Hemoglobin: 13.5 g/dL (ref 11.1–15.9)
Immature Grans (Abs): 0 x10E3/uL (ref 0.0–0.1)
Immature Granulocytes: 0 %
Lymphocytes Absolute: 0.9 x10E3/uL (ref 0.7–3.1)
Lymphs: 29 %
MCH: 31.3 pg (ref 26.6–33.0)
MCHC: 33.7 g/dL (ref 31.5–35.7)
MCV: 93 fL (ref 79–97)
Monocytes Absolute: 0.3 x10E3/uL (ref 0.1–0.9)
Monocytes: 9 %
Neutrophils Absolute: 1.8 x10E3/uL (ref 1.4–7.0)
Neutrophils: 58 %
Platelets: 208 x10E3/uL (ref 150–379)
RBC: 4.31 x10E6/uL (ref 3.77–5.28)
RDW: 13 % (ref 12.3–15.4)
WBC: 3.2 x10E3/uL — ABNORMAL LOW (ref 3.4–10.8)

## 2017-12-30 LAB — ARTHRITIS PANEL
Anti Nuclear Antibody(ANA): NEGATIVE
Rheumatoid fact SerPl-aCnc: <10 [IU]/mL (ref 0.0–13.9)
Sed Rate: 16 mm/h (ref 0–32)
Uric Acid: 2.7 mg/dL (ref 2.5–7.1)

## 2017-12-30 NOTE — Telephone Encounter (Signed)
Called, no answer. Left voicemail for patient to call back. Thank!

## 2017-12-30 NOTE — Progress Notes (Signed)
Orders Placed This Encounter  Procedures  . AMB referral to orthopedics    Referral Priority:   Urgent    Referral Type:   Consultation    Number of Visits Requested:   [place]  MSN, FNP-C [facility] [address] [place],  [address] [phone]

## 2017-12-30 NOTE — Telephone Encounter (Signed)
-----   Message from Dorena Dew, [place] sent at 12/30/2017 12:45 PM EDT ----- Regarding: lab results Please inform patient that right knee xray is abnormal. Degenerative changes and spurring noted. She warrants a referral to to orthopedic specialist. Schedule first available appointment.   [name]  MSN, FNP-C [facility] [address] [place], [place] [address] [phone]

## 2017-12-31 ENCOUNTER — Encounter (INDEPENDENT_AMBULATORY_CARE_PROVIDER_SITE_OTHER): Payer: Self-pay | Admitting: Physician Assistant

## 2017-12-31 ENCOUNTER — Ambulatory Visit (INDEPENDENT_AMBULATORY_CARE_PROVIDER_SITE_OTHER): Payer: Self-pay | Admitting: Physician Assistant

## 2017-12-31 ENCOUNTER — Telehealth: Payer: Self-pay

## 2017-12-31 DIAGNOSIS — M2241 Chondromalacia patellae, right knee: Secondary | ICD-10-CM

## 2017-12-31 LAB — URINE CULTURE

## 2017-12-31 NOTE — Telephone Encounter (Signed)
CALLED BACK. SEE PREVIOUS NOTE.

## 2017-12-31 NOTE — Telephone Encounter (Signed)
CALLED AND SPOKE WITH PATIENT, ADVISED OF ABNORMAL XRAY AND THAT CHANGES ARE SEEN. ADVISED THAT WE ARE REFERRING TO ORTHOPEDIC AND SHE WILL BE RECEIVING A CALL TO SCHEDULED AN APPOINTMENT WITH THEIR OFFICE. THANKS!

## 2017-12-31 NOTE — Progress Notes (Signed)
Office Visit Note   Patient: Sylvia Best           Date of Birth: 09/19/1980           MRN: [id number] Visit Date: 12/31/2017              Requested by: Dorena Dew, FNP [address], [place] [address] PCP: Dorena Dew, FNP   Assessment & Plan: Visit Diagnoses:  1. Chondromalacia patellae, right knee     Plan: She will continue to take the Mobic that she was given yesterday.  Talked to her about the natural anti-inflammatories.  Also discussed the importance of knee friendly exercises i.e. stationary bike elliptical swimming.  She shown quad strengthening exercises that she is to perform every other day.  Follow-up if pain persist or becomes worse.  Consider cortisone injections next step.  Follow-Up Instructions: Return if symptoms worsen or fail to improve.   Orders:  No orders of the defined types were placed in this encounter.  No orders of the defined types were placed in this encounter.     Procedures: No procedures performed   Clinical Data: No additional findings.   Subjective: Chief Complaint  Patient presents with  . Right Knee - Pain    HPI Sylvia Best is a 37 year old female who comes in today due to right knee pain.  She is seen in the [facility] on Monday due to knee pain.  She states she is had knee pain for the past 4-5 days.  Pain is worse whenever she is in a seated position for long time or kneeling to pray.  She has had no known injury.  She notes no mechanical symptoms of the knee.  She did get some Mobic from the ER and this is starting to help with her knee pain.  Radiographs of the right knee are reviewed on the coned system and show just some mild patellofemoral changes otherwise knee is well preserved.  No acute fractures no bony abnormalities. Review of Systems Please see HPI otherwise negative  Objective: Vital Signs: There were no vitals taken for this visit.  Physical Exam  Constitutional: She is  oriented to person, place, and time. She appears well-developed and well-nourished. No distress.  Pulmonary/Chest: Effort normal.  Neurological: She is alert and oriented to person, place, and time.  Skin: She is not diaphoretic.  Psychiatric: She has a normal mood and affect.    Ortho Exam Bilateral knees full range of motion without pain.  Slight crepitus with passive range of motion of the right knee.  No instability valgus varus stressing of either knee.  No tenderness along medial lateral joint line of either knee.  Tenderness peripatellar region of the right knee only.  No effusion abnormal warmth erythema of either knee.  Specialty Comments:  No specialty comments available.  Imaging: No results found.   PMFS History: Patient Active Problem List   Diagnosis Date Noted  . Chronic pain of right knee 12/30/2017  . Abnormal x-ray of lower extremity 12/30/2017  . Allergic rhinitis 10/13/2017  . Pelvic pain 11/06/2016  . Dyspareunia in female 11/06/2016  . S/P cesarean section 04/05/2016  . Orthostatic dizziness 12/05/2015  . Umbilical pain 44/11/4740  . Language barrier, cultural differences 10/31/2015  . History of diabetes mellitus 10/03/2015  . AMA (advanced maternal age) multigravida 35+ 10/03/2015   Past Medical History:  Diagnosis Date  . Asthma   . Diabetes mellitus  gestational  . GERD (gastroesophageal reflux disease)    no meds  . Gestational diabetes     Family History  Problem Relation Age of Onset  . Hypertension Mother   . Heart disease Father   . Diabetes Father   . Diabetes Paternal Grandfather     Past Surgical History:  Procedure Laterality Date  . CESAREAN SECTION  04/26/2011   Procedure: CESAREAN SECTION;  Surgeon: Donnamae Jude, MD;  Location: [facility];  Service: Gynecology;  Laterality: N/A;  . CESAREAN SECTION N/A 04/05/2016   Procedure: CESAREAN SECTION;  Surgeon: Osborne Oman, MD;  Location: [facility];  Service: Obstetrics;   Laterality: N/A;  . DILATION AND CURETTAGE OF UTERUS    . TONSILLECTOMY     age 67   Social History   Occupational History  . Not on file  Tobacco Use  . Smoking status: Never Smoker  . Smokeless tobacco: Never Used  Substance and Sexual Activity  . Alcohol use: No  . Drug use: No  . Sexual activity: Yes    Birth control/protection: None

## 2018-01-01 ENCOUNTER — Telehealth: Payer: Self-pay

## 2018-01-01 ENCOUNTER — Other Ambulatory Visit: Payer: Self-pay | Admitting: Family Medicine

## 2018-01-01 DIAGNOSIS — N390 Urinary tract infection, site not specified: Secondary | ICD-10-CM

## 2018-01-01 MED ORDER — SULFAMETHOXAZOLE-TRIMETHOPRIM 800-160 MG PO TABS: 1.0000 | ORAL_TABLET | Freq: Two times a day (BID) | ORAL | 0 refills | Status: DC

## 2018-01-01 MED FILL — SULFAMETHOXAZOLE-TMP DS TAB: 800-160 | 10 days supply | Qty: 20 | Fill #0

## 2018-01-01 NOTE — Telephone Encounter (Signed)
Called, no answer. Left a message for patient to call back. Thanks!  

## 2018-01-01 NOTE — Progress Notes (Signed)
Meds ordered this encounter  Medications  . sulfamethoxazole-trimethoprim (BACTRIM DS,SEPTRA DS) 800-160 MG tablet    Sig: Take 1 tablet by mouth 2 (two) times daily.    Dispense:  20 tablet    Refill:  0    [name]  MSN, FNP-C Patient [facility] [address] [place], [place] [address] [phone]

## 2018-01-01 NOTE — Telephone Encounter (Signed)
-----   Message from Dorena Dew, [place] sent at 01/01/2018 10:23 AM EDT ----- Regarding: lab results Please inform patient that urine culture yielded staph species. Will treat with Bactrim 800-160 mg every 12 hours for 10 days. Increase water intake to 3-4 bottles per day and wipe from front to back. Follow up as scheduled.   [name]  MSN, FNP-C Patient [facility] [address] [place], [place] [address] [phone]

## 2018-01-01 NOTE — Telephone Encounter (Signed)
Spoke with patient using interpreter ID [id number]. Advised that urine showed bacteria growing and that we are treating her with bactrim twice daily for 10 days. Asked that she increase water intake to 3 to 4 bottles daily and wipe from front to back when using the bathroom. Thanks!

## 2018-01-02 ENCOUNTER — Encounter: Payer: Self-pay | Admitting: Family Medicine

## 2018-01-02 NOTE — Progress Notes (Signed)
Patient ID: Sylvia Best, female   DOB: Jul 22, 1981, 37 y.o.   MRN: [id number]   Sylvia Best, a 37 year old female presents complaining of right pelvic pain, right knee and heel pain, generalized malaise,  and allergic rhinitis.  She is complaining of pelvic pain, primarily to right lower quadrant over the past several weeks. Patient underwent a pelvic and transvaginal ultrasound on 11/13/2016, which showed a probable small uterine fibroid. Current pain intensity is 5-6/10 described as intermittent and sharp. Patient is sexually active and complains of pain during sexual intercourse. She denies abnormal vaginal bleeding or vaginal discharge. Patient has nexplanon for birth control. Nexplanon was placed by [facility] greater than 1 year ago. Patient does not have a menstrual cycle. A referral was sent to gynecology for consult in January, but appointment has not been scheduled.   Patient is complaining of right knee pain over the past several months.  She is not identified any inciting event related to right knee pain.  She also endorses pain to right heel.  Pain is worsened by applying full weight, and prolonged standing.  Patient has attempted over-the-counter ibuprofen, last taken around 3 AM without sustained relief. Current pain intensity is 9/10.   Patient is also complaining of fever, chills, and generalized malaise over the past 2 days.  She is not identified any contacts with similar symptoms.  Patient also endorses fatigue.  Patient is also complaining of sinus pain and sinus pressure over the past 2 weeks. Symptoms also include itching to ears and periodic sore throat. Patient has attempted OTC Ibuprofen without sustained relief.  Past Medical History:  Diagnosis Date  . Asthma   . Diabetes mellitus    gestational  . GERD (gastroesophageal reflux disease)    no meds  . Gestational diabetes    Allergies as of 12/29/2017   No Known Allergies     Medication List       Accurate as of 12/29/17 10:11 AM. Always use your most recent med list.          acetaminophen 500 MG tablet Commonly known as:  TYLENOL Take 1 tablet (500 mg total) by mouth every 6 (six) hours as needed.   cetirizine 10 MG tablet Commonly known as:  ZYRTEC Take 1 tablet (10 mg total) by mouth daily.   meloxicam 7.5 MG tablet Commonly known as:  MOBIC Take 1 tablet (7.5 mg total) by mouth daily.   NEXPLANON 68 MG Impl implant Generic drug:  etonogestrel 1 each by Subdermal route continuous.      Social History   Socioeconomic History  . Marital status: Married    Spouse name: Not on file  . Number of children: Not on file  . Years of education: Not on file  . Highest education level: Not on file  Occupational History  . Not on file  Social Needs  . Financial resource strain: Not on file  . Food insecurity:    Worry: Not on file    Inability: Not on file  . Transportation needs:    Medical: Not on file    Non-medical: Not on file  Tobacco Use  . Smoking status: Never Smoker  . Smokeless tobacco: Never Used  Substance and Sexual Activity  . Alcohol use: No  . Drug use: No  . Sexual activity: Yes    Birth control/protection: None  Lifestyle  . Physical activity:    Days per week: Not on file    Minutes per  session: Not on file  . Stress: Not on file  Relationships  . Social connections:    Talks on phone: Not on file    Gets together: Not on file    Attends religious service: Not on file    Active member of club or organization: Not on file    Attends meetings of clubs or organizations: Not on file    Relationship status: Not on file  . Intimate partner violence:    Fear of current or ex partner: Not on file    Emotionally abused: Not on file    Physically abused: Not on file    Forced sexual activity: Not on file  Other Topics Concern  . Not on file  Social History Narrative  . Not on file   Review of Systems  Constitutional: Positive for  fatigue and fever. Negative for unexpected weight change.  HENT: Positive for postnasal drip, rhinorrhea, sinus pressure and sinus pain.   Eyes: Negative.   Respiratory: Negative.   Cardiovascular: Negative.   Gastrointestinal: Negative.   Endocrine: Negative.   Genitourinary: Positive for pelvic pain. Negative for frequency and hematuria.  Musculoskeletal:        Bilateral knee pain  Allergic/Immunologic: Negative.   Neurological: Positive for headaches.  Psychiatric/Behavioral: Negative.        Objective:   Physical Exam  Constitutional: She appears ill.  HENT:  Head: Normocephalic.  Mouth/Throat: Oropharynx is clear and moist.  Eyes: Pupils are equal, round, and reactive to light.  Cardiovascular: Normal rate and normal heart sounds.  Pulmonary/Chest: Effort normal and breath sounds normal.  Abdominal: Soft. Bowel sounds are normal.  Musculoskeletal:       Right knee: She exhibits decreased range of motion and swelling. She exhibits no erythema. Tenderness found. Lateral joint line tenderness noted.  Neurological: She is alert.  Skin: Skin is warm and dry.         BP 101/69 (BP Location: Left Arm, Patient Position: Sitting, Cuff Size: Normal)   Pulse (!) 108   Temp 98.2 F (36.8 C) (Oral)   Resp 16   Ht 5\' 2"  (1.575 m)   Wt 136 lb (61.7 kg)   SpO2 99%   BMI 24.87 kg/m   Assessment & Plan:  Chronic pain of right knee Patient warrants referral to orthopedic services for further work up and evaluation - DG Knee Complete 4 Views Left; Future - ketorolac (TORADOL) injection 60 mg - meloxicam (MOBIC) 7.5 MG tablet; Take 1 tablet (7.5 mg total) by mouth daily.  Dispense: 30 tablet; Refill: 1 - Arthritis Panel  Pain and swelling of right knee - DG Knee Complete 4 Views Left; Future - ketorolac (TORADOL) injection 60 mg - meloxicam (MOBIC) 7.5 MG tablet; Take 1 tablet (7.5 mg total) by mouth daily.  Dispense: 30 tablet; Refill: 1 - Arthritis Panel  Fever and  chills - Influenza A/B - Urinalysis Dipstick - Urine Culture  Generalized body aches - Influenza A/B - Urine Culture  Sinus pressure - cetirizine (ZYRTEC) 10 MG tablet; Take 1 tablet (10 mg total) by mouth daily.  Dispense: 30 tablet; Refill: 11  Uterine leiomyoma, unspecified location Referral was previously sent, will inquire about scheduling appointment  [name]  MSN, [facility] [address] [place], [place] [address] [phone]

## 2018-01-15 ENCOUNTER — Ambulatory Visit: Payer: Medicaid Other | Admitting: Obstetrics and Gynecology

## 2018-06-09 MED FILL — MELOXICAM 7.5 MG TABLET: 7.5 | 30 days supply | Qty: 30 | Fill #1

## 2018-06-12 ENCOUNTER — Encounter: Payer: Self-pay | Admitting: Family Medicine

## 2018-06-12 ENCOUNTER — Other Ambulatory Visit: Payer: Self-pay

## 2018-06-12 ENCOUNTER — Ambulatory Visit (INDEPENDENT_AMBULATORY_CARE_PROVIDER_SITE_OTHER): Payer: Self-pay | Admitting: Family Medicine

## 2018-06-12 VITALS — BP 108/66 | HR 90 | Temp 99.0°F | Ht 62.0 in | Wt 136.0 lb

## 2018-06-12 DIAGNOSIS — R5383 Other fatigue: Secondary | ICD-10-CM

## 2018-06-12 DIAGNOSIS — R300 Dysuria: Secondary | ICD-10-CM

## 2018-06-12 DIAGNOSIS — R42 Dizziness and giddiness: Secondary | ICD-10-CM

## 2018-06-12 DIAGNOSIS — H6991 Unspecified Eustachian tube disorder, right ear: Secondary | ICD-10-CM

## 2018-06-12 DIAGNOSIS — Z8639 Personal history of other endocrine, nutritional and metabolic disease: Secondary | ICD-10-CM

## 2018-06-12 LAB — POCT URINALYSIS DIP (MANUAL ENTRY)
Bilirubin, UA: NEGATIVE
Glucose, UA: NEGATIVE mg/dL
Ketones, POC UA: NEGATIVE mg/dL
Nitrite, UA: NEGATIVE
Protein Ur, POC: NEGATIVE mg/dL
Spec Grav, UA: 1.025 (ref 1.010–1.025)
Urobilinogen, UA: 1 E.U./dL
pH, UA: 7 (ref 5.0–8.0)

## 2018-06-12 LAB — POCT URINE PREGNANCY: Preg Test, Ur: NEGATIVE

## 2018-06-12 MED ORDER — PREDNISONE 10 MG (21) PO TBPK: ORAL_TABLET | ORAL | 0 refills | Status: DC

## 2018-06-12 MED ORDER — MECLIZINE HCL 12.5 MG PO TABS: 12.5000 mg | ORAL_TABLET | Freq: Three times a day (TID) | ORAL | 0 refills | Status: DC | PRN

## 2018-06-12 NOTE — Progress Notes (Signed)
poc

## 2018-06-12 NOTE — Patient Instructions (Signed)
I sent medicine to the pharmacy for your ears and dizziness.     Eustachian Tube Dysfunction The eustachian tube connects the middle ear to the back of the nose. It regulates air pressure in the middle ear by allowing air to move between the ear and nose. It also helps to drain fluid from the middle ear space. When the eustachian tube does not function properly, air pressure, fluid, or both can build up in the middle ear. Eustachian tube dysfunction can affect one or both ears. What are the causes? This condition happens when the eustachian tube becomes blocked or cannot open normally. This may result from:  Ear infections.  Colds and other upper respiratory infections.  Allergies.  Irritation, such as from cigarette smoke or acid from the stomach coming up into the esophagus (gastroesophageal reflux).  Sudden changes in air pressure, such as from descending in an airplane.  Abnormal growths in the nose or throat, such as nasal polyps, tumors, or enlarged tissue at the back of the throat (adenoids).  What increases the risk? This condition may be more likely to develop in people who smoke and people who are overweight. Eustachian tube dysfunction may also be more likely to develop in children, especially children who have:  Certain birth defects of the mouth, such as cleft palate.  Large tonsils and adenoids.  What are the signs or symptoms? Symptoms of this condition may include:  A feeling of fullness in the ear.  Ear pain.  Clicking or popping noises in the ear.  Ringing in the ear.  Hearing loss.  Loss of balance.  Symptoms may get worse when the air pressure around you changes, such as when you travel to an area of high elevation or fly on an airplane. How is this diagnosed? This condition may be diagnosed based on:  Your symptoms.  A physical exam of your ear, nose, and throat.  Tests, such as those that measure: ? The movement of your eardrum  (tympanogram). ? Your hearing (audiometry).  How is this treated? Treatment depends on the cause and severity of your condition. If your symptoms are mild, you may be able to relieve your symptoms by moving air into ("popping") your ears. If you have symptoms of fluid in your ears, treatment may include:  Decongestants.  Antihistamines.  Nasal sprays or ear drops that contain medicines that reduce swelling (steroids).  In some cases, you may need to have a procedure to drain the fluid in your eardrum (myringotomy). In this procedure, a small tube is placed in the eardrum to:  Drain the fluid.  Restore the air in the middle ear space.  Follow these instructions at home:  Take over-the-counter and prescription medicines only as told by your health care provider.  Use techniques to help pop your ears as recommended by your health care provider. These may include: ? Chewing gum. ? Yawning. ? Frequent, forceful swallowing. ? Closing your mouth, holding your nose closed, and gently blowing as if you are trying to blow air out of your nose.  Do not do any of the following until your health care provider approves: ? Travel to high altitudes. ? Fly in airplanes. ? Work in a Pension scheme manager or room. ? Scuba dive.  Keep your ears dry. Dry your ears completely after showering or bathing.  Do not smoke.  Keep all follow-up visits as told by your health care provider. This is important. Contact a health care provider if:  Your symptoms do  not go away after treatment.  Your symptoms come back after treatment.  You are unable to pop your ears.  You have: ? A fever. ? Pain in your ear. ? Pain in your head or neck. ? Fluid draining from your ear.  Your hearing suddenly changes.  You become very dizzy.  You lose your balance. This information is not intended to replace advice given to you by your health care provider. Make sure you discuss any questions you have with your  health care provider. Document Released: 09/29/2015 Document Revised: 02/08/2016 Document Reviewed: 09/21/2014 Elsevier Interactive Patient Education  [employer].

## 2018-06-12 NOTE — Progress Notes (Signed)
  [facility]   Progress Note: General Provider: Lanae Boast, FNP  SUBJECTIVE:   Sylvia Best is a 37 y.o. female who  has a past medical history of Asthma, Diabetes mellitus, GERD (gastroesophageal reflux disease), and Gestational diabetes.. Patient presents today for Dizziness (for 2 weeks) and Headache Patient states that she has a hx of anemia and does not take iron. Patient with nexplanon and has irregular menstration. [place] states that she has mild burning with urination.  Hx of gestational DM  Review of Systems  Neurological: Positive for dizziness and headaches.  All other systems reviewed and are negative.    OBJECTIVE: BP 108/66   Pulse 90   Temp 99 F (37.2 C) (Oral)   Ht 5\' 2"  (1.575 m)   Wt 136 lb (61.7 kg)   SpO2 100%   BMI 24.87 kg/m   Physical Exam  Constitutional: She is oriented to person, place, and time. She appears well-developed and well-nourished. No distress.  HENT:  Head: Normocephalic and atraumatic.  Right Ear: Hearing and ear canal normal. A middle ear effusion is present.  Left Ear: Hearing and tympanic membrane normal.  Eyes: Pupils are equal, round, and reactive to light. Conjunctivae and EOM are normal.  Neck: Normal range of motion.  Cardiovascular: Normal rate, regular rhythm, normal heart sounds and intact distal pulses.  Pulmonary/Chest: Effort normal and breath sounds normal. No respiratory distress.  Abdominal: Soft. Bowel sounds are normal. She exhibits no distension.  Musculoskeletal: Normal range of motion.  Neurological: She is alert and oriented to person, place, and time.  Skin: Skin is warm and dry.  Psychiatric: She has a normal mood and affect. Her behavior is normal. Thought content normal.  Nursing note and vitals reviewed.   ASSESSMENT/PLAN:   1. Dizziness - POCT urine pregnancy - POCT urinalysis dipstick - Iron, TIBC and Ferritin Panel - CBC with Differential -  TSH - meclizine (ANTIVERT) 12.5 MG tablet; Take 1 tablet (12.5 mg total) by mouth 3 (three) times daily as needed for dizziness.  Dispense: 30 tablet; Refill: 0 - Comprehensive metabolic panel  2. Dysuria - Urine Culture  3. Other fatigue - Iron, TIBC and Ferritin Panel - CBC with Differential - TSH  4. History of diabetes mellitus - Hemoglobin A1c  5. Eustachian tube disorder, right - predniSONE (STERAPRED UNI-PAK 21 TAB) 10 MG (21) TBPK tablet; Take as directed on pack  Dispense: 21 tablet; Refill: 0 - meclizine (ANTIVERT) 12.5 MG tablet; Take 1 tablet (12.5 mg total) by mouth 3 (three) times daily as needed for dizziness.  Dispense: 30 tablet; Refill: 0         The patient was given clear instructions to go to ER or return to medical center if symptoms do not improve, worsen or new problems develop. The patient verbalized understanding and agreed with plan of care.   [name] Nathaneil Canary, FNP-BC [facility] [address] [place], [place] [address] [phone]     This note has been created with Dragon speech recognition software and smart phrase technology. Any transcriptional errors are unintentional.

## 2018-06-13 LAB — COMPREHENSIVE METABOLIC PANEL
ALT: 13 IU/L (ref 0–32)
AST: 12 IU/L (ref 0–40)
Albumin/Globulin Ratio: 1.9 (ref 1.2–2.2)
Albumin: 4.4 g/dL (ref 3.5–5.5)
Alkaline Phosphatase: 53 IU/L (ref 39–117)
BUN/Creatinine Ratio: 22 (ref 9–23)
BUN: 13 mg/dL (ref 6–20)
Bilirubin Total: 0.4 mg/dL (ref 0.0–1.2)
CO2: 23 mmol/L (ref 20–29)
Calcium: 9.6 mg/dL (ref 8.7–10.2)
Chloride: 102 mmol/L (ref 96–106)
Creatinine, Ser: 0.58 mg/dL (ref 0.57–1.00)
GFR calc Af Amer: 136 mL/min/{1.73_m2} (ref >59)
GFR calc non Af Amer: 118 mL/min/{1.73_m2} (ref >59)
Globulin, Total: 2.3 g/dL (ref 1.5–4.5)
Glucose: 97 mg/dL (ref 65–99)
Potassium: 3.8 mmol/L (ref 3.5–5.2)
Sodium: 138 mmol/L (ref 134–144)
Total Protein: 6.7 g/dL (ref 6.0–8.5)

## 2018-06-13 LAB — IRON,TIBC AND FERRITIN PANEL
Ferritin: 106 ng/mL (ref 15–150)
Iron Saturation: 20 % (ref 15–55)
Iron: 61 ug/dL (ref 27–159)
Total Iron Binding Capacity: 306 ug/dL (ref 250–450)
UIBC: 245 ug/dL (ref 131–425)

## 2018-06-13 LAB — CBC WITH DIFFERENTIAL/PLATELET
Basophils Absolute: 0 10*3/uL (ref 0.0–0.2)
Basos: 0 %
EOS (ABSOLUTE): 0.2 10*3/uL (ref 0.0–0.4)
Eos: 4 %
Hematocrit: 35.2 % (ref 34.0–46.6)
Hemoglobin: 12.2 g/dL (ref 11.1–15.9)
Immature Grans (Abs): 0 10*3/uL (ref 0.0–0.1)
Immature Granulocytes: 0 %
Lymphocytes Absolute: 2.1 10*3/uL (ref 0.7–3.1)
Lymphs: 38 %
MCH: 31.7 pg (ref 26.6–33.0)
MCHC: 34.7 g/dL (ref 31.5–35.7)
MCV: 91 fL (ref 79–97)
Monocytes Absolute: 0.3 10*3/uL (ref 0.1–0.9)
Monocytes: 6 %
Neutrophils Absolute: 2.9 10*3/uL (ref 1.4–7.0)
Neutrophils: 52 %
Platelets: 237 10*3/uL (ref 150–450)
RBC: 3.85 x10E6/uL (ref 3.77–5.28)
RDW: 12.4 % (ref 12.3–15.4)
WBC: 5.6 10*3/uL (ref 3.4–10.8)

## 2018-06-13 LAB — TSH: TSH: 0.735 u[IU]/mL (ref 0.450–4.500)

## 2018-06-13 LAB — HEMOGLOBIN A1C
Est. average glucose Bld gHb Est-mCnc: 114 mg/dL
Hgb A1c MFr Bld: 5.6 % (ref 4.8–5.6)

## 2018-06-14 LAB — URINE CULTURE

## 2018-06-16 ENCOUNTER — Other Ambulatory Visit: Payer: Self-pay

## 2018-06-16 DIAGNOSIS — H6991 Unspecified Eustachian tube disorder, right ear: Secondary | ICD-10-CM

## 2018-06-16 DIAGNOSIS — R42 Dizziness and giddiness: Secondary | ICD-10-CM

## 2018-06-16 MED ORDER — MECLIZINE HCL 12.5 MG PO TABS: 12.5000 mg | ORAL_TABLET | Freq: Three times a day (TID) | ORAL | 0 refills | Status: DC | PRN

## 2018-06-19 ENCOUNTER — Ambulatory Visit: Payer: Self-pay | Attending: Family Medicine

## 2018-06-19 ENCOUNTER — Ambulatory Visit: Payer: Medicaid Other

## 2018-09-23 ENCOUNTER — Ambulatory Visit: Payer: Medicaid Other | Admitting: Family Medicine

## 2018-11-23 ENCOUNTER — Other Ambulatory Visit: Payer: Self-pay

## 2018-11-23 ENCOUNTER — Ambulatory Visit: Payer: Medicaid Other | Admitting: Family Medicine

## 2018-11-23 ENCOUNTER — Ambulatory Visit (INDEPENDENT_AMBULATORY_CARE_PROVIDER_SITE_OTHER): Payer: Self-pay | Admitting: Family Medicine

## 2018-11-23 ENCOUNTER — Encounter: Payer: Self-pay | Admitting: Family Medicine

## 2018-11-23 VITALS — BP 105/69 | HR 87 | Temp 98.2°F | Ht 62.0 in | Wt 134.0 lb

## 2018-11-23 DIAGNOSIS — H6991 Unspecified Eustachian tube disorder, right ear: Secondary | ICD-10-CM

## 2018-11-23 DIAGNOSIS — R42 Dizziness and giddiness: Secondary | ICD-10-CM

## 2018-11-23 DIAGNOSIS — R319 Hematuria, unspecified: Secondary | ICD-10-CM

## 2018-11-23 DIAGNOSIS — J0110 Acute frontal sinusitis, unspecified: Secondary | ICD-10-CM

## 2018-11-23 LAB — POCT URINALYSIS DIP (MANUAL ENTRY)
Bilirubin, UA: NEGATIVE
Glucose, UA: NEGATIVE mg/dL
Ketones, POC UA: NEGATIVE mg/dL
Leukocytes, UA: NEGATIVE
Nitrite, UA: NEGATIVE
Spec Grav, UA: 1.025 (ref 1.010–1.025)
Urobilinogen, UA: 0.2 E.U./dL
pH, UA: 6 (ref 5.0–8.0)

## 2018-11-23 MED ORDER — PREDNISONE 10 MG (21) PO TBPK: ORAL_TABLET | ORAL | 0 refills | Status: DC

## 2018-11-23 MED ORDER — FLUTICASONE PROPIONATE 50 MCG/ACT NA SUSP: 2.0000 | Freq: Every day | NASAL | 6 refills | Status: DC

## 2018-11-23 MED ORDER — MECLIZINE HCL 12.5 MG PO TABS: 12.5000 mg | ORAL_TABLET | Freq: Three times a day (TID) | ORAL | 1 refills | Status: DC | PRN

## 2018-11-23 MED ORDER — NAPROXEN 500 MG PO TABS
500.0000 mg | ORAL_TABLET | Freq: Two times a day (BID) | ORAL | 0 refills | Status: DC | PRN
Start: 2018-11-23 — End: 2020-01-05

## 2018-11-23 MED ORDER — AMOXICILLIN-POT CLAVULANATE 875-125 MG PO TABS: 1.0000 | ORAL_TABLET | Freq: Two times a day (BID) | ORAL | 0 refills | Status: DC

## 2018-11-23 MED FILL — NAPROXEN 500 MG TABLET: 500 | 15 days supply | Qty: 30 | Fill #0

## 2018-11-23 MED FILL — AMOX-CLAV 875-125 MG TABLET: 875-125 | 10 days supply | Qty: 20 | Fill #0

## 2018-11-23 MED FILL — FLUTICASONE PROP 50 MCG SPR: 50 | 30 days supply | Qty: 16 | Fill #0

## 2018-11-23 MED FILL — predniSONE 10 MG TABS: 10 | 6 days supply | Qty: 21 | Fill #0

## 2018-11-23 NOTE — Patient Instructions (Signed)
Urinary Tract Infection, Adult A urinary tract infection (UTI) is an infection of any part of the urinary tract. The urinary tract includes:  The kidneys.  The ureters.  The bladder.  The urethra. These organs make, store, and get rid of pee (urine) in the body. What are the causes? This is caused by germs (bacteria) in your genital area. These germs grow and cause swelling (inflammation) of your urinary tract. What increases the risk? You are more likely to develop this condition if:  You have a small, thin tube (catheter) to drain pee.  You cannot control when you pee or poop (incontinence).  You are female, and: ? You use these methods to prevent pregnancy: ? A medicine that kills sperm (spermicide). ? A device that blocks sperm (diaphragm). ? You have low levels of a female hormone (estrogen). ? You are pregnant.  You have genes that add to your risk.  You are sexually active.  You take antibiotic medicines.  You have trouble peeing because of: ? A prostate that is bigger than normal, if you are female. ? A blockage in the part of your body that drains pee from the bladder (urethra). ? A kidney stone. ? A nerve condition that affects your bladder (neurogenic bladder). ? Not getting enough to drink. ? Not peeing often enough.  You have other conditions, such as: ? Diabetes. ? A weak disease-fighting system (immune system). ? Sickle cell disease. ? Gout. ? Injury of the spine. What are the signs or symptoms? Symptoms of this condition include:  Needing to pee right away (urgently).  Peeing often.  Peeing small amounts often.  Pain or burning when peeing.  Blood in the pee.  Pee that smells bad or not like normal.  Trouble peeing.  Pee that is cloudy.  Fluid coming from the vagina, if you are female.  Pain in the belly or lower back. Other symptoms include:  Throwing up (vomiting).  No urge to eat.  Feeling mixed up (confused).  Being tired  and grouchy (irritable).  A fever.  Watery poop (diarrhea). How is this treated? This condition may be treated with:  Antibiotic medicine.  Other medicines.  Drinking enough water. Follow these instructions at home:  Medicines  Take over-the-counter and prescription medicines only as told by your doctor.  If you were prescribed an antibiotic medicine, take it as told by your doctor. Do not stop taking it even if you start to feel better. General instructions  Make sure you: ? Pee until your bladder is empty. ? Do not hold pee for a long time. ? Empty your bladder after sex. ? Wipe from front to back after pooping if you are a female. Use each tissue one time when you wipe.  Drink enough fluid to keep your pee pale yellow.  Keep all follow-up visits as told by your doctor. This is important. Contact a doctor if:  You do not get better after 1-2 days.  Your symptoms go away and then come back. Get help right away if:  You have very bad back pain.  You have very bad pain in your lower belly.  You have a fever.  You are sick to your stomach (nauseous).  You are throwing up. Summary  A urinary tract infection (UTI) is an infection of any part of the urinary tract.  This condition is caused by germs in your genital area.  There are many risk factors for a UTI. These include having a small, thin   tube to drain pee and not being able to control when you pee or poop.  Treatment includes antibiotic medicines for germs.  Drink enough fluid to keep your pee pale yellow. This information is not intended to replace advice given to you by your health care provider. Make sure you discuss any questions you have with your health care provider. Document Released: 02/19/2008 Document Revised: 03/12/2018 Document Reviewed: 03/12/2018 Elsevier Interactive Patient Education  2019 [employer] Sinusitis, Adult Sinusitis is soreness and swelling (inflammation) of your sinuses.  Sinuses are hollow spaces in the bones around your face. They are located:  Around your eyes.  In the middle of your forehead.  Behind your nose.  In your cheekbones. Your sinuses and nasal passages are lined with a fluid called mucus. Mucus drains out of your sinuses. Swelling can trap mucus in your sinuses. This lets germs (bacteria, virus, or fungus) grow, which leads to infection. Most of the time, this condition is caused by a virus. What are the causes? This condition is caused by:  Allergies.  Asthma.  Germs.  Things that block your nose or sinuses.  Growths in the nose (nasal polyps).  Chemicals or irritants in the air.  Fungus (rare). What increases the risk? You are more likely to develop this condition if:  You have a weak body defense system (immune system).  You do a lot of swimming or diving.  You use nasal sprays too much.  You smoke. What are the signs or symptoms? The main symptoms of this condition are pain and a feeling of pressure around the sinuses. Other symptoms include:  Stuffy nose (congestion).  Runny nose (drainage).  Swelling and warmth in the sinuses.  Headache.  Toothache.  A cough that may get worse at night.  Mucus that collects in the throat or the back of the nose (postnasal drip).  Being unable to smell and taste.  Being very tired (fatigue).  A fever.  Sore throat.  Bad breath. How is this diagnosed? This condition is diagnosed based on:  Your symptoms.  Your medical history.  A physical exam.  Tests to find out if your condition is short-term (acute) or long-term (chronic). Your doctor may: ? Check your nose for growths (polyps). ? Check your sinuses using a tool that has a light (endoscope). ? Check for allergies or germs. ? Do imaging tests, such as an MRI or CT scan. How is this treated? Treatment for this condition depends on the cause and whether it is short-term or long-term.  If caused by a  virus, your symptoms should go away on their own within 10 days. You may be given medicines to relieve symptoms. They include: ? Medicines that shrink swollen tissue in the nose. ? Medicines that treat allergies (antihistamines). ? A spray that treats swelling of the nostrils. ? Rinses that help get rid of thick mucus in your nose (nasal saline washes).  If caused by bacteria, your doctor may wait to see if you will get better without treatment. You may be given antibiotic medicine if you have: ? A very bad infection. ? A weak body defense system.  If caused by growths in the nose, you may need to have surgery. Follow these instructions at home: Medicines  Take, use, or apply over-the-counter and prescription medicines only as told by your doctor. These may include nasal sprays.  If you were prescribed an antibiotic medicine, take it as told by your doctor. Do not stop taking the antibiotic  even if you start to feel better. Hydrate and humidify   Drink enough water to keep your pee (urine) pale yellow.  Use a cool mist humidifier to keep the humidity level in your home above 50%.  Breathe in steam for 10-15 minutes, 3-4 times a day, or as told by your doctor. You can do this in the bathroom while a hot shower is running.  Try not to spend time in cool or dry air. Rest  Rest as much as you can.  Sleep with your head raised (elevated).  Make sure you get enough sleep each night. General instructions   Put a warm, moist washcloth on your face 3-4 times a day, or as often as told by your doctor. This will help with discomfort.  Wash your hands often with soap and water. If there is no soap and water, use hand sanitizer.  Do not smoke. Avoid being around people who are smoking (secondhand smoke).  Keep all follow-up visits as told by your doctor. This is important. Contact a doctor if:  You have a fever.  Your symptoms get worse.  Your symptoms do not get better within  10 days. Get help right away if:  You have a very bad headache.  You cannot stop throwing up (vomiting).  You have very bad pain or swelling around your face or eyes.  You have trouble seeing.  You feel confused.  Your neck is stiff.  You have trouble breathing. Summary  Sinusitis is swelling of your sinuses. Sinuses are hollow spaces in the bones around your face.  This condition is caused by tissues in your nose that become inflamed or swollen. This traps germs. These can lead to infection.  If you were prescribed an antibiotic medicine, take it as told by your doctor. Do not stop taking it even if you start to feel better.  Keep all follow-up visits as told by your doctor. This is important. This information is not intended to replace advice given to you by your health care provider. Make sure you discuss any questions you have with your health care provider. Document Released: 02/19/2008 Document Revised: 02/02/2018 Document Reviewed: 02/02/2018 Elsevier Interactive Patient Education  2019 [employer].

## 2018-11-23 NOTE — Progress Notes (Signed)
Patient Sylvia Best   Progress Note: General Provider: Lanae Boast, FNP  SUBJECTIVE:   Sylvia Best is a 38 y.o. female who  has a past medical history of Asthma, Diabetes mellitus, GERD (gastroesophageal reflux disease), and Gestational diabetes.. Patient presents today for Back Pain and Dysuria  Patient states that she is having burning with urination and pain in the lower back x 2 weeks. She also states that she has pain in both knees that is chronic. No injury or swelling.  Has been taking ibuprofen with relief. Patient also states headache, ear fullness and dizziness x 2 weeks. Denies history of allergic rhinitis.  Review of Systems  Constitutional: Negative.   HENT: Negative.   Eyes: Negative.   Respiratory: Negative.   Cardiovascular: Negative.   Gastrointestinal: Negative.   Genitourinary: Positive for dysuria.  Musculoskeletal: Positive for back pain and joint pain.  Skin: Negative.   Neurological: Negative.   Psychiatric/Behavioral: Negative.      OBJECTIVE: BP 105/69 (BP Location: Right Arm, Patient Position: Sitting, Cuff Size: Normal)   Pulse 87   Temp 98.2 F (36.8 C) (Oral)   Ht 5\' 2"  (1.575 m)   Wt 134 lb (60.8 kg)   SpO2 100%   BMI 24.51 kg/m   Wt Readings from Last 3 Encounters:  11/23/18 134 lb (60.8 kg)  06/12/18 136 lb (61.7 kg)  12/29/17 136 lb (61.7 kg)     Physical Exam Vitals signs and nursing note reviewed.  Constitutional:      General: She is not in acute distress.    Appearance: She is well-developed.  HENT:     Head: Normocephalic and atraumatic.     Right Ear: A middle ear effusion is present.     Left Ear: A middle ear effusion is present.     Nose: Mucosal edema present.     Right Turbinates: Swollen.     Left Turbinates: Swollen.     Right Sinus: Maxillary sinus tenderness and frontal sinus tenderness present.     Left Sinus: Maxillary sinus tenderness and frontal sinus tenderness  present.     Mouth/Throat:     Mouth: Mucous membranes are moist.     Pharynx: Oropharynx is clear. Uvula midline. No posterior oropharyngeal erythema.  Eyes:     Conjunctiva/sclera: Conjunctivae normal.     Pupils: Pupils are equal, round, and reactive to light.  Neck:     Musculoskeletal: Normal range of motion.  Cardiovascular:     Rate and Rhythm: Normal rate and regular rhythm.     Heart sounds: Normal heart sounds.  Pulmonary:     Effort: Pulmonary effort is normal. No respiratory distress.     Breath sounds: Normal breath sounds.  Abdominal:     General: Bowel sounds are normal. There is no distension.     Palpations: Abdomen is soft.     Tenderness: There is left CVA tenderness.  Musculoskeletal: Normal range of motion.        General: No swelling or deformity.     Right lower leg: No edema.     Left lower leg: No edema.  Skin:    General: Skin is warm and dry.  Neurological:     Mental Status: She is alert and oriented to person, place, and time.  Psychiatric:        Behavior: Behavior normal.        Thought Content: Thought content normal.     ASSESSMENT/PLAN:  1.  Acute non-recurrent frontal sinusitis - predniSONE (STERAPRED UNI-PAK 21 TAB) 10 MG (21) TBPK tablet; Take as directed on pack  Dispense: 21 tablet; Refill: 0 - amoxicillin-clavulanate (AUGMENTIN) 875-125 MG tablet; Take 1 tablet by mouth 2 (two) times daily.  Dispense: 20 tablet; Refill: 0 - naproxen (NAPROSYN) 500 MG tablet; Take 1 tablet (500 mg total) by mouth 2 (two) times daily as needed for moderate pain or headache.  Dispense: 30 tablet; Refill: 0  2. Dizziness - POCT urinalysis dipstick - meclizine (ANTIVERT) 12.5 MG tablet; Take 1 tablet (12.5 mg total) by mouth 3 (three) times daily as needed for dizziness.  Dispense: 30 tablet; Refill: 1  3. Eustachian tube disorder, right - predniSONE (STERAPRED UNI-PAK 21 TAB) 10 MG (21) TBPK tablet; Take as directed on pack  Dispense: 21 tablet; Refill:  0 - meclizine (ANTIVERT) 12.5 MG tablet; Take 1 tablet (12.5 mg total) by mouth 3 (three) times daily as needed for dizziness.  Dispense: 30 tablet; Refill: 1 - fluticasone (FLONASE) 50 MCG/ACT nasal spray; Place 2 sprays into both nostrils daily.  Dispense: 16 g; Refill: 6  4. Hematuria, unspecified type - Urine Culture   Return in about 6 months (around 05/26/2019), or if symptoms worsen or fail to improve.    The patient was given clear instructions to go to ER or return to medical center if symptoms do not improve, worsen or new problems develop. The patient verbalized understanding and agreed with plan of Best.   [name] Nathaneil Canary, FNP-BC Patient Sylvia Best [address] [place], [place] [address] [phone]

## 2018-11-25 LAB — URINE CULTURE

## 2019-05-26 ENCOUNTER — Ambulatory Visit: Payer: Medicaid Other | Admitting: Family Medicine

## 2019-05-26 ENCOUNTER — Encounter (HOSPITAL_COMMUNITY): Payer: Self-pay

## 2019-05-26 ENCOUNTER — Encounter (HOSPITAL_COMMUNITY): Payer: Self-pay | Admitting: *Deleted

## 2019-07-05 ENCOUNTER — Telehealth (HOSPITAL_COMMUNITY): Payer: Self-pay

## 2019-07-05 NOTE — Telephone Encounter (Signed)
Telephoned patient at home using interpreter [phone]. Left message to call and schedule with [facility].

## 2019-07-07 ENCOUNTER — Other Ambulatory Visit (HOSPITAL_COMMUNITY): Payer: Self-pay | Admitting: *Deleted

## 2019-07-07 DIAGNOSIS — N6320 Unspecified lump in the left breast, unspecified quadrant: Secondary | ICD-10-CM

## 2019-07-13 ENCOUNTER — Encounter (HOSPITAL_COMMUNITY): Payer: Self-pay

## 2019-07-15 ENCOUNTER — Ambulatory Visit (HOSPITAL_COMMUNITY)
Admission: RE | Admit: 2019-07-15 | Discharge: 2019-07-15 | Disposition: A | Discharge status: Home / Self Care | Payer: Medicaid Other | Source: Ambulatory Visit | Attending: Obstetrics and Gynecology | Admitting: Obstetrics and Gynecology

## 2019-07-15 ENCOUNTER — Encounter (HOSPITAL_COMMUNITY): Payer: Self-pay

## 2019-07-15 ENCOUNTER — Ambulatory Visit
Admission: RE | Admit: 2019-07-15 | Discharge: 2019-07-15 | Disposition: A | Discharge status: Home / Self Care | Payer: No Typology Code available for payment source | Source: Ambulatory Visit | Attending: Obstetrics and Gynecology | Admitting: Obstetrics and Gynecology

## 2019-07-15 ENCOUNTER — Other Ambulatory Visit: Payer: Self-pay

## 2019-07-15 ENCOUNTER — Ambulatory Visit
Admission: RE | Admit: 2019-07-15 | Discharge: 2019-07-15 | Disposition: A | Discharge status: Home / Self Care | Payer: Medicaid Other | Source: Ambulatory Visit | Attending: Obstetrics and Gynecology | Admitting: Obstetrics and Gynecology

## 2019-07-15 DIAGNOSIS — Z0000 Encounter for general adult medical examination without abnormal findings: Secondary | ICD-10-CM | POA: Insufficient documentation

## 2019-07-15 DIAGNOSIS — N6320 Unspecified lump in the left breast, unspecified quadrant: Secondary | ICD-10-CM

## 2019-07-15 DIAGNOSIS — Z020 Encounter for examination for admission to educational institution: Secondary | ICD-10-CM | POA: Insufficient documentation

## 2019-07-15 DIAGNOSIS — N6321 Unspecified lump in the left breast, upper outer quadrant: Secondary | ICD-10-CM | POA: Insufficient documentation

## 2019-07-15 DIAGNOSIS — Z1239 Encounter for other screening for malignant neoplasm of breast: Secondary | ICD-10-CM

## 2019-07-15 NOTE — Progress Notes (Signed)
Complaints of two breast lumps x 2 months that are painful. Patient states the pain comes and goes. Patient rates the pain at a 5 out of 10.  Pap Smear: Pap smear not completed today. Last Pap smear was 03/31/2018 at the [facility] and normal. Per patient has no history of an abnormal Pap smear. Last Pap smear result is in Epic.  Physical exam: Breasts Right breast slightly larger than left breast and per patient has not noticed a change. No skin abnormalities bilateral breasts. No nipple retraction bilateral breasts. No nipple discharge bilateral breasts. No lymphadenopathy. No lumps palpated right breast. Palpated a bb sized lump within the left breast at 1 o'clock 6 cm from the nipple. Complaints of left outer breast pain on exam. Referred patient to the [facility] for a diagnostic mammogram and left breast ultrasound. Appointment scheduled for Thursday, July 15, 2019 at 1010.        Pelvic/Bimanual No Pap smear completed today since last Pap smear was 03/31/2018. Pap smear not indicated per [facility] guidelines.   Smoking History: Patient has never smoked.  Patient Navigation: Patient education provided. Access to services provided for patient through [facility] program. Arabic interpreter provided.   Breast and Cervical Cancer Risk Assessment: Patient has no family history of breast cancer, known genetic mutations, or radiation treatment to the chest before age 31. Patient has no history of cervical dysplasia, immunocompromised, or DES exposure in-utero.  Risk Assessment    Risk Scores      07/15/2019   Last edited by: Loletta Parish, RN   5-year risk: 0.6 %   Lifetime risk: 12.5 %         Used Arabic interpreter Alveta Heimlich from [employer].

## 2019-07-15 NOTE — Patient Instructions (Signed)
Explained breast self awareness with Mikylah Espericueta. Patient did not need a Pap smear today due to last Pap smear was 03/31/2018. Let her know [facility] will cover Pap smears every 3 years unless has a history of abnormal Pap smears. Referred patient to the [facility] for a diagnostic mammogram and left breast ultrasound. Appointment scheduled for Thursday, July 15, 2019 at 1010. Patient aware of appointment and will be there. Angeliah Shea verbalized understanding.  Emberleigh Reily, Arvil Chaco, RN 9:08 AM

## 2019-08-03 ENCOUNTER — Ambulatory Visit: Payer: Medicaid Other | Admitting: Family Medicine

## 2019-09-07 ENCOUNTER — Ambulatory Visit (INDEPENDENT_AMBULATORY_CARE_PROVIDER_SITE_OTHER): Payer: Self-pay | Admitting: Family Medicine

## 2019-09-07 ENCOUNTER — Other Ambulatory Visit: Payer: Self-pay

## 2019-09-07 ENCOUNTER — Encounter: Payer: Self-pay | Admitting: Family Medicine

## 2019-09-07 DIAGNOSIS — Z7189 Other specified counseling: Secondary | ICD-10-CM

## 2019-09-07 DIAGNOSIS — R0989 Other specified symptoms and signs involving the circulatory and respiratory systems: Secondary | ICD-10-CM

## 2019-09-07 MED ORDER — AZITHROMYCIN 250 MG PO TABS: ORAL_TABLET | ORAL | 0 refills | Status: DC

## 2019-09-07 MED ORDER — GUAIFENESIN 100 MG/5ML PO SOLN: 5.0000 mL | ORAL | 0 refills | Status: DC | PRN

## 2019-09-07 MED FILL — AZITHROMYCIN 250 MG TABLET: 250 | 5 days supply | Qty: 6 | Fill #0

## 2019-09-07 NOTE — Progress Notes (Signed)
Virtual Visit via Telephone Note  I connected with Sylvia Best on 09/07/19 at  9:20 AM EST by telephone and verified that I am speaking with the correct person using two identifiers.   I discussed the limitations, risks, security and privacy concerns of performing an evaluation and management service by telephone and the availability of in person appointments. I also discussed with the patient that there may be a patient responsible charge related to this service. The patient expressed understanding and agreed to proceed.   History of Present Illness: Sylvia Best, a 38 year old female with a current diagnosis of COVID-19 infection 1 week ago. Patient reports chest congestion that is not improving. She denies persistent cough, headache, fever, chills, nausea, vomiting or diarrhea. She has been taking over the counter medications for this problem for greater than 1 week without sustained relief.  She says that she has increased rest and fluid intake to assist with this problem. Other household occupants are asymptomatic.  Past Medical History:  Diagnosis Date  . Asthma   . Diabetes mellitus    gestational  . GERD (gastroesophageal reflux disease)    no meds  . Gestational diabetes    Immunization History  Administered Date(s) Administered  . Tdap 02/15/2016   No Known Allergies  Past Surgical History:  Procedure Laterality Date  . CESAREAN SECTION  04/26/2011   Procedure: CESAREAN SECTION;  Surgeon: Donnamae Jude, MD;  Location: [facility];  Service: Gynecology;  Laterality: N/A;  . CESAREAN SECTION N/A 04/05/2016   Procedure: CESAREAN SECTION;  Surgeon: Osborne Oman, MD;  Location: [facility];  Service: Obstetrics;  Laterality: N/A;  . DILATION AND CURETTAGE OF UTERUS    . TONSILLECTOMY     age 66   Review of Systems  Constitutional: Positive for malaise/fatigue. Negative for chills, fever and weight loss.  HENT: Positive for congestion.   Eyes: Negative.   Respiratory:  Positive for cough. Negative for sputum production and shortness of breath.   Cardiovascular: Negative.   Gastrointestinal: Negative.   Genitourinary: Negative.   Musculoskeletal: Negative.   Skin: Negative.   Neurological: Negative.   Psychiatric/Behavioral: Negative.      Past Medical History:  Diagnosis Date  . Asthma   . Diabetes mellitus    gestational  . GERD (gastroesophageal reflux disease)    no meds  . Gestational diabetes    Social History   Socioeconomic History  . Marital status: Married    Spouse name: Not on file  . Number of children: Not on file  . Years of education: Not on file  . Highest education level: Associate degree: occupational, Hotel manager, or vocational program  Occupational History  . Not on file  Tobacco Use  . Smoking status: Never Smoker  . Smokeless tobacco: Never Used  Substance and Sexual Activity  . Alcohol use: No  . Drug use: No  . Sexual activity: Yes    Birth control/protection: Pill  Other Topics Concern  . Not on file  Social History Narrative  . Not on file   Social Determinants of Health   Financial Resource Strain:   . Difficulty of Paying Living Expenses: Not on file  Food Insecurity:   . Worried About Charity fundraiser in the Last Year: Not on file  . Ran Out of Food in the Last Year: Not on file  Transportation Needs: No Transportation Needs  . Lack of Transportation (Medical): No  . Lack of Transportation (Non-Medical): No  Physical Activity:   . Days of Exercise per Week: Not on file  . Minutes of Exercise per Session: Not on file  Stress:   . Feeling of Stress : Not on file  Social Connections:   . Frequency of Communication with Friends and Family: Not on file  . Frequency of Social Gatherings with Friends and Family: Not on file  . Attends Religious Services: Not on file  . Active Member of Clubs or Organizations: Not on file  . Attends Archivist Meetings: Not on file  . Marital Status:  Not on file  Intimate Partner Violence:   . Fear of Current or Ex-Partner: Not on file  . Emotionally Abused: Not on file  . Physically Abused: Not on file  . Sexually Abused: Not on file   Immunization History  Administered Date(s) Administered  . Tdap 02/15/2016    Assessment and Plan:  1. Symptoms of upper respiratory infection (URI) - azithromycin (ZITHROMAX) 250 MG tablet; Take 500 mg on day 1 and on days 2-5 take 250 mg  Dispense: 6 tablet; Refill: 0 - guaiFENesin (ROBITUSSIN) 100 MG/5ML SOLN; Take 5 mLs (100 mg total) by mouth every 4 (four) hours as needed for cough or to loosen phlegm.  Dispense: 236 mL; Refill: 0   4 (four) hours as needed for cough or to loosen phlegm.  Dispense: 236 mL; Refill: 0  2. Chest congestion Continue to increase fluid intake and rest.   3. Advice given about COVID-19 virus infection Continue to quarantine for a total of 14 days.Also advised to inform all close contacts of diagnosis. Ensure that she is wearing protective mask, isolation and social distancing.  Advised patient to have husband drop off forms for her job to be completed by this provider.    Follow Up Instructions:  I discussed the assessment and treatment plan with the patient. The patient was provided an opportunity to ask questions and all were answered. The patient agreed with the plan and demonstrated an understanding of the instructions.   The patient was advised to call back or seek an in-person evaluation if the symptoms worsen or if the condition fails to improve as anticipated.  I provided 10 minutes of non-face-to-face time during this encounter.  Sylvia Pounds  APRN, MSN, FNP-C Patient [facility] [address] [place], [place] [address] [phone]

## 2020-01-01 ENCOUNTER — Emergency Department (HOSPITAL_COMMUNITY): Payer: BLUE CROSS/BLUE SHIELD

## 2020-01-01 ENCOUNTER — Other Ambulatory Visit: Payer: Self-pay | Admitting: Diagnostic Radiology

## 2020-01-01 ENCOUNTER — Encounter (HOSPITAL_COMMUNITY): Payer: Self-pay | Admitting: Emergency Medicine

## 2020-01-01 ENCOUNTER — Inpatient Hospital Stay (HOSPITAL_COMMUNITY)
Admission: EM | Admit: 2020-01-01 | Discharge: 2020-01-05 | DRG: 066 | Disposition: A | Discharge status: Home / Self Care | Payer: BLUE CROSS/BLUE SHIELD | Attending: Internal Medicine | Admitting: Internal Medicine

## 2020-01-01 ENCOUNTER — Other Ambulatory Visit: Payer: Self-pay

## 2020-01-01 DIAGNOSIS — K219 Gastro-esophageal reflux disease without esophagitis: Secondary | ICD-10-CM | POA: Diagnosis present

## 2020-01-01 DIAGNOSIS — R112 Nausea with vomiting, unspecified: Secondary | ICD-10-CM

## 2020-01-01 DIAGNOSIS — Z793 Long term (current) use of hormonal contraceptives: Secondary | ICD-10-CM

## 2020-01-01 DIAGNOSIS — R000 Tachycardia, unspecified: Secondary | ICD-10-CM | POA: Diagnosis not present

## 2020-01-01 DIAGNOSIS — Z833 Family history of diabetes mellitus: Secondary | ICD-10-CM

## 2020-01-01 DIAGNOSIS — Z8249 Family history of ischemic heart disease and other diseases of the circulatory system: Secondary | ICD-10-CM

## 2020-01-01 DIAGNOSIS — R519 Headache, unspecified: Secondary | ICD-10-CM

## 2020-01-01 DIAGNOSIS — Z8639 Personal history of other endocrine, nutritional and metabolic disease: Secondary | ICD-10-CM

## 2020-01-01 DIAGNOSIS — Z8632 Personal history of gestational diabetes: Secondary | ICD-10-CM

## 2020-01-01 DIAGNOSIS — R140 Abdominal distension (gaseous): Secondary | ICD-10-CM

## 2020-01-01 DIAGNOSIS — G4453 Primary thunderclap headache: Secondary | ICD-10-CM | POA: Diagnosis present

## 2020-01-01 DIAGNOSIS — J45909 Unspecified asthma, uncomplicated: Secondary | ICD-10-CM | POA: Diagnosis present

## 2020-01-01 DIAGNOSIS — I609 Nontraumatic subarachnoid hemorrhage, unspecified: Secondary | ICD-10-CM | POA: Diagnosis not present

## 2020-01-01 DIAGNOSIS — Z20822 Contact with and (suspected) exposure to covid-19: Secondary | ICD-10-CM | POA: Diagnosis present

## 2020-01-01 DIAGNOSIS — E119 Type 2 diabetes mellitus without complications: Secondary | ICD-10-CM | POA: Diagnosis present

## 2020-01-01 LAB — COMPREHENSIVE METABOLIC PANEL
ALT: 24 U/L (ref 0–44)
AST: 24 U/L (ref 15–41)
Albumin: 3.7 g/dL (ref 3.5–5.0)
Alkaline Phosphatase: 36 U/L — ABNORMAL LOW (ref 38–126)
Anion gap: 9 (ref 5–15)
BUN: 8 mg/dL (ref 6–20)
CO2: 20 mmol/L — ABNORMAL LOW (ref 22–32)
Calcium: 8.9 mg/dL (ref 8.9–10.3)
Chloride: 107 mmol/L (ref 98–111)
Creatinine, Ser: 0.62 mg/dL (ref 0.44–1.00)
GFR calc Af Amer: >60 mL/min (ref >60)
GFR calc non Af Amer: >60 mL/min (ref >60)
Glucose, Bld: 157 mg/dL — ABNORMAL HIGH (ref 70–99)
Potassium: 3.8 mmol/L (ref 3.5–5.1)
Sodium: 136 mmol/L (ref 135–145)
Total Bilirubin: 0.5 mg/dL (ref 0.3–1.2)
Total Protein: 7.1 g/dL (ref 6.5–8.1)

## 2020-01-01 LAB — CBC
HCT: 39.9 % (ref 36.0–46.0)
Hemoglobin: 13.5 g/dL (ref 12.0–15.0)
MCH: 31.7 pg (ref 26.0–34.0)
MCHC: 33.8 g/dL (ref 30.0–36.0)
MCV: 93.7 fL (ref 80.0–100.0)
Platelets: 255 10*3/uL (ref 150–400)
RBC: 4.26 MIL/uL (ref 3.87–5.11)
RDW: 12 % (ref 11.5–15.5)
WBC: 4.9 10*3/uL (ref 4.0–10.5)
nRBC: 0 % (ref 0.0–0.2)

## 2020-01-01 LAB — CSF CELL COUNT WITH DIFFERENTIAL
Eosinophils, CSF: 1 % (ref 0–1)
Lymphs, CSF: 10 % — ABNORMAL LOW (ref 40–80)
Monocyte-Macrophage-Spinal Fluid: 2 % — ABNORMAL LOW (ref 15–45)
RBC Count, CSF: 160250 /mm3 — ABNORMAL HIGH
Segmented Neutrophils-CSF: 87 % — ABNORMAL HIGH (ref 0–6)
Tube #: 3
WBC, CSF: 194 /mm3 (ref 0–5)

## 2020-01-01 LAB — DIFFERENTIAL
Abs Immature Granulocytes: 0.01 10*3/uL (ref 0.00–0.07)
Basophils Absolute: 0 10*3/uL (ref 0.0–0.1)
Basophils Relative: 1 %
Eosinophils Absolute: 0.3 10*3/uL (ref 0.0–0.5)
Eosinophils Relative: 6 %
Immature Granulocytes: 0 %
Lymphocytes Relative: 45 %
Lymphs Abs: 2.2 10*3/uL (ref 0.7–4.0)
Monocytes Absolute: 0.3 10*3/uL (ref 0.1–1.0)
Monocytes Relative: 7 %
Neutro Abs: 2 10*3/uL (ref 1.7–7.7)
Neutrophils Relative %: 41 %

## 2020-01-01 LAB — I-STAT BETA HCG BLOOD, ED (MC, WL, AP ONLY): I-stat hCG, quantitative: <5 m[IU]/mL (ref <5)

## 2020-01-01 LAB — PROTIME-INR
INR: 1.1 (ref 0.8–1.2)
Prothrombin Time: 14 seconds (ref 11.4–15.2)

## 2020-01-01 LAB — APTT: aPTT: 28 seconds (ref 24–36)

## 2020-01-01 LAB — PROTEIN, CSF: Total  Protein, CSF: 142 mg/dL — ABNORMAL HIGH (ref 15–45)

## 2020-01-01 LAB — GLUCOSE, CSF: Glucose, CSF: 88 mg/dL — ABNORMAL HIGH (ref 40–70)

## 2020-01-01 MED ORDER — MECLIZINE HCL 12.5 MG PO TABS
12.5000 mg | ORAL_TABLET | Freq: Three times a day (TID) | ORAL | Status: DC | PRN
  Administered 2020-01-03: 05:00:00 12.5 mg via ORAL
  Filled 2020-01-01: qty 1

## 2020-01-01 MED ORDER — SODIUM CHLORIDE 0.9% FLUSH
3.0000 mL | Freq: Two times a day (BID) | INTRAVENOUS | Status: DC
  Administered 2020-01-02 – 2020-01-05 (×5): 3 mL via INTRAVENOUS

## 2020-01-01 MED ORDER — SODIUM CHLORIDE 0.9 % IV BOLUS
500.0000 mL | Freq: Once | INTRAVENOUS | Status: AC
  Administered 2020-01-01: 12:00:00 500 mL via INTRAVENOUS

## 2020-01-01 MED ORDER — LACTATED RINGERS IV SOLN: INTRAVENOUS | Status: DC

## 2020-01-01 MED ORDER — DEXAMETHASONE SODIUM PHOSPHATE 10 MG/ML IJ SOLN
10.0000 mg | Freq: Once | INTRAMUSCULAR | Status: AC
  Administered 2020-01-01: 12:00:00 10 mg via INTRAVENOUS
  Filled 2020-01-01: qty 1

## 2020-01-01 MED ORDER — LORAZEPAM 2 MG/ML IJ SOLN
1.0000 mg | Freq: Once | INTRAMUSCULAR | Status: AC
  Administered 2020-01-01: 1 mg via INTRAVENOUS
  Filled 2020-01-01: qty 1

## 2020-01-01 MED ORDER — HYDROMORPHONE HCL 1 MG/ML IJ SOLN
0.5000 mg | INTRAMUSCULAR | Status: DC | PRN
  Administered 2020-01-02 – 2020-01-04 (×4): 1 mg via INTRAVENOUS
  Filled 2020-01-01 (×4): qty 1

## 2020-01-01 MED ORDER — NAPROXEN 250 MG PO TABS
500.0000 mg | ORAL_TABLET | Freq: Two times a day (BID) | ORAL | Status: DC | PRN
  Administered 2020-01-02: 500 mg via ORAL
  Filled 2020-01-01: qty 2

## 2020-01-01 MED ORDER — OXYCODONE HCL 5 MG PO TABS
5.0000 mg | ORAL_TABLET | ORAL | Status: DC | PRN
  Administered 2020-01-02 – 2020-01-05 (×3): 5 mg via ORAL
  Filled 2020-01-01 (×3): qty 1

## 2020-01-01 MED ORDER — KETOROLAC TROMETHAMINE 30 MG/ML IJ SOLN
30.0000 mg | Freq: Once | INTRAMUSCULAR | Status: AC
  Administered 2020-01-01: 30 mg via INTRAVENOUS
  Filled 2020-01-01: qty 1

## 2020-01-01 MED ORDER — HYDROMORPHONE HCL 1 MG/ML IJ SOLN
1.0000 mg | Freq: Once | INTRAMUSCULAR | Status: AC
  Administered 2020-01-01: 1 mg via INTRAVENOUS
  Filled 2020-01-01: qty 1

## 2020-01-01 MED ORDER — DIPHENHYDRAMINE HCL 50 MG/ML IJ SOLN
25.0000 mg | Freq: Once | INTRAMUSCULAR | Status: AC
  Administered 2020-01-01: 25 mg via INTRAVENOUS
  Filled 2020-01-01: qty 1

## 2020-01-01 MED ORDER — SODIUM CHLORIDE 0.9 % IV BOLUS
500.0000 mL | Freq: Once | INTRAVENOUS | Status: AC
  Administered 2020-01-01: 500 mL via INTRAVENOUS

## 2020-01-01 MED ORDER — ACETAMINOPHEN 500 MG PO TABS: 500.0000 mg | ORAL_TABLET | Freq: Four times a day (QID) | ORAL | Status: DC | PRN

## 2020-01-01 MED ORDER — SODIUM CHLORIDE 0.9% FLUSH
3.0000 mL | Freq: Once | INTRAVENOUS | Status: AC
Start: 2020-01-01 — End: 2020-01-03
  Administered 2020-01-03: 3 mL via INTRAVENOUS

## 2020-01-01 MED ORDER — IOHEXOL 350 MG/ML SOLN
75.0000 mL | Freq: Once | INTRAVENOUS | Status: AC | PRN
  Administered 2020-01-01: 75 mL via INTRAVENOUS

## 2020-01-01 MED ORDER — POLYETHYLENE GLYCOL 3350 17 G PO PACK: 17.0000 g | PACK | Freq: Every day | ORAL | Status: DC | PRN

## 2020-01-01 MED ORDER — METOCLOPRAMIDE HCL 5 MG/ML IJ SOLN
10.0000 mg | Freq: Once | INTRAMUSCULAR | Status: AC
  Administered 2020-01-01: 12:00:00 10 mg via INTRAVENOUS
  Filled 2020-01-01: qty 2

## 2020-01-01 MED ORDER — PROMETHAZINE HCL 25 MG PO TABS: 12.5000 mg | ORAL_TABLET | Freq: Four times a day (QID) | ORAL | Status: DC | PRN

## 2020-01-01 MED ORDER — LORATADINE 10 MG PO TABS
10.0000 mg | ORAL_TABLET | Freq: Every day | ORAL | Status: DC
  Administered 2020-01-02 – 2020-01-05 (×4): 10 mg via ORAL
  Filled 2020-01-01 (×4): qty 1

## 2020-01-01 NOTE — ED Notes (Signed)
Pt repeatedly asks for something to drink and to sit up.  Able to console at this time while waiting for dispo. MSG to MD regarding CSF results.

## 2020-01-01 NOTE — ED Notes (Signed)
Pt ambulated to the restroom. Urine sample collected.

## 2020-01-01 NOTE — ED Notes (Signed)
Pt signed paper consent, placed in med rec drawer.

## 2020-01-01 NOTE — ED Notes (Signed)
Pt left for fluro.

## 2020-01-01 NOTE — H&P (Signed)
History and Physical    Sylvia Best [id number] DOB: Apr 09, 1981 DOA: 01/01/2020  Referring MD/NP/PA: Dr. Langston Masker PCP: Lanae Boast, [place]  Patient coming from: home  Chief Complaint: Worst headache of my life  HPI: Sylvia Best is a 39 y.o. female with medical history significant of diabetes, asthma, GERD who presented with complaint of the worst headache of her life. She reports that starting this morning she developed a severe headache that began in the frontal region and radiated into her neck with associated neck stiffness. She reports the pain was a 10/10 on presentation however after medications in the ED her pain is better controlled currently. She is unable to identify any exacerbating factors at this time as the pain came on suddenly. She admits to history of headaches in the past however typically these resolve with naproxen or tylenol. She denies any vision change, CP, SOB, N/V/D, fever, chills.   ED Course: In the ED she received CT head that was negative. Lumbar puncture attempted at bedside however was then changed to fluoroscopic due to patient discomfort. She received migraine cocktail as well as dilaudid with eventual improvement in her pain. Lumbar puncture consistent with a bloody tap. Neurosurgery notified and after work up including negative CT and lumbar puncture without xanthochromia felt a bleed was unlikely. Neurology also notified and recommends MRI brain and spine for further evaluation.  Patient admitted in observation status for intractable headache.  Review of Systems: As per HPI otherwise 10 point review of systems negative.   Past Medical History:  Diagnosis Date  . Asthma   . Diabetes mellitus    gestational  . GERD (gastroesophageal reflux disease)    no meds  . Gestational diabetes     Past Surgical History:  Procedure Laterality Date  . CESAREAN SECTION  04/26/2011   Procedure: CESAREAN SECTION;  Surgeon: Donnamae Jude, MD;  Location: [facility];   Service: Gynecology;  Laterality: N/A;  . CESAREAN SECTION N/A 04/05/2016   Procedure: CESAREAN SECTION;  Surgeon: Osborne Oman, MD;  Location: [facility];  Service: Obstetrics;  Laterality: N/A;  . DILATION AND CURETTAGE OF UTERUS    . TONSILLECTOMY     age 39     reports that she has never smoked. She has never used smokeless tobacco. She reports that she does not drink alcohol or use drugs.  No Known Allergies  Family History  Problem Relation Age of Onset  . Hypertension Mother   . Heart disease Father   . Diabetes Father   . Diabetes Paternal Grandfather     Prior to Admission medications   Medication Sig Start Date End Date Taking? Authorizing Provider  acetaminophen (TYLENOL) 500 MG tablet Take 1 tablet (500 mg total) by mouth every 6 (six) hours as needed. 03/13/17  Yes Dorena Dew, FNP  etonogestrel (NEXPLANON) 68 MG IMPL implant 1 each by Subdermal route continuous.   Yes [provider]  fluticasone (FLONASE) 50 MCG/ACT nasal spray Place 2 sprays into both nostrils daily. 11/23/18  Yes Lanae Boast, FNP  cetirizine (ZYRTEC) 10 MG tablet Take 1 tablet (10 mg total) by mouth daily. Patient not taking: Reported on 11/23/2018 12/29/17   Dorena Dew, FNP  meclizine (ANTIVERT) 12.5 MG tablet Take 1 tablet (12.5 mg total) by mouth 3 (three) times daily as needed for dizziness. Patient not taking: Reported on 01/01/2020 11/23/18   Lanae Boast, FNP  naproxen (NAPROSYN) 500 MG tablet Take 1 tablet (500 mg total) by mouth  2 (two) times daily as needed for moderate pain or headache. Patient not taking: Reported on 01/01/2020 11/23/18   Lanae Boast, FNP    Physical Exam: Vitals:   01/01/20 1803 01/01/20 1857 01/01/20 1858 01/01/20 2102  BP:  113/77  136/81  Pulse: (!) 107 (!) 113 (!) 111 (!) 125  Resp: (!) 24 (!) 24 (!) 25 19  Temp:      TempSrc:      SpO2: 100% 100% 100% 99%      Constitutional: NAD, calm, comfortable Vitals:   01/01/20 1803  01/01/20 1857 01/01/20 1858 01/01/20 2102  BP:  113/77  136/81  Pulse: (!) 107 (!) 113 (!) 111 (!) 125  Resp: (!) 24 (!) 24 (!) 25 19  Temp:      TempSrc:      SpO2: 100% 100% 100% 99%   Eyes: PERRL, lids and conjunctivae normal ENMT: Mucous membranes are moist. Posterior pharynx clear of any exudate or lesions.Normal dentition.  Neck: normal, supple, no masses, no thyromegaly Respiratory: clear to auscultation bilaterally, no wheezing, no crackles. Normal respiratory effort. No accessory muscle use.  Cardiovascular: Regular rate and rhythm, no murmurs / rubs / gallops. No extremity edema. 2+ pedal pulses.  Abdomen: no tenderness, no masses palpated. No hepatosplenomegaly. Bowel sounds positive.  Musculoskeletal: no clubbing / cyanosis. No joint deformity upper and lower extremities. Good ROM, no contractures. Normal muscle tone.  Skin: no rashes, lesions, ulcers. No induration Neurologic: CN 2-12 grossly intact. Sensation intact. Strength 5/5 in all 4.  Psychiatric: Normal judgment and insight. Alert and oriented x 3. Normal mood.   Labs on Admission: I have personally reviewed following labs and imaging studies  CBC: Recent Labs  Lab 01/01/20 1106  WBC 4.9  NEUTROABS 2.0  HGB 13.5  HCT 39.9  MCV 93.7  PLT 123456   Basic Metabolic Panel: Recent Labs  Lab 01/01/20 1106  NA 136  K 3.8  CL 107  CO2 20*  GLUCOSE 157*  BUN 8  CREATININE 0.62  CALCIUM 8.9   GFR: CrCl cannot be calculated (Unknown ideal weight.). Liver Function Tests: Recent Labs  Lab 01/01/20 1106  AST 24  ALT 24  ALKPHOS 36*  BILITOT 0.5  PROT 7.1  ALBUMIN 3.7   No results for input(s): LIPASE, AMYLASE in the last 168 hours. No results for input(s): AMMONIA in the last 168 hours. Coagulation Profile: Recent Labs  Lab 01/01/20 1106  INR 1.1   Cardiac Enzymes: No results for input(s): CKTOTAL, CKMB, CKMBINDEX, TROPONINI in the last 168 hours. BNP (last 3 results) No results for input(s):  PROBNP in the last 8760 hours. HbA1C: No results for input(s): HGBA1C in the last 72 hours. CBG: No results for input(s): GLUCAP in the last 168 hours. Lipid Profile: No results for input(s): CHOL, HDL, LDLCALC, TRIG, CHOLHDL, LDLDIRECT in the last 72 hours. Thyroid Function Tests: No results for input(s): TSH, T4TOTAL, FREET4, T3FREE, THYROIDAB in the last 72 hours. Anemia Panel: No results for input(s): VITAMINB12, FOLATE, FERRITIN, TIBC, IRON, RETICCTPCT in the last 72 hours. Urine analysis:    Component Value Date/Time   COLORURINE STRAW (A) 02/16/2016 1210   APPEARANCEUR CLEAR 02/16/2016 1210   LABSPEC 1.025 10/13/2017 1143   PHURINE 5.5 10/13/2017 1143   GLUCOSEU NEGATIVE 10/13/2017 1143   HGBUR SMALL (A) 10/13/2017 1143   BILIRUBINUR negative 11/23/2018 1400   BILIRUBINUR neg 12/29/2017 1032   KETONESUR negative 11/23/2018 1400   KETONESUR NEGATIVE 10/13/2017 1143   PROTEINUR trace (  A) 11/23/2018 1400   PROTEINUR trace 12/29/2017 1032   PROTEINUR NEGATIVE 10/13/2017 1143   UROBILINOGEN 0.2 11/23/2018 1400   UROBILINOGEN 0.2 10/13/2017 1143   NITRITE Negative 11/23/2018 1400   NITRITE neg 12/29/2017 1032   NITRITE NEGATIVE 10/13/2017 1143   LEUKOCYTESUR Negative 11/23/2018 1400   Sepsis Labs: @LABRCNTIP (procalcitonin:4,lacticidven:4) ) Recent Results (from the past 240 hour(s))  CSF culture     Status: None (Preliminary result)   Collection Time: 01/01/20  4:58 PM   Specimen: PATH Cytology CSF; Cerebrospinal Fluid  Result Value Ref Range Status   Specimen Description CSF  Final   Special Requests NONE  Final   Gram Stain   Final    CYTOSPIN SMEAR WBC PRESENT, PREDOMINANTLY PMN NO WBC SEEN Performed at [facility], [place] [address], [place], [place] [address]    Culture PENDING  Incomplete   Report Status PENDING  Incomplete     Radiological Exams on Admission: CT Angio Head W or Wo Contrast  Result Date: 01/01/2020 CLINICAL DATA:  Severe headache.  Normal head CT. Bloody lumbar puncture. EXAM: CT ANGIOGRAPHY HEAD AND NECK TECHNIQUE: Multidetector CT imaging of the head and neck was performed using the standard protocol during bolus administration of intravenous contrast. Multiplanar CT image reconstructions and MIPs were obtained to evaluate the vascular anatomy. Carotid stenosis measurements (when applicable) are obtained utilizing NASCET criteria, using the distal internal carotid diameter as the denominator. CONTRAST:  64mL OMNIPAQUE IOHEXOL 350 MG/ML SOLN COMPARISON:  CT head without contrast 01/01/2019 FINDINGS: CTA NECK FINDINGS Aortic arch: 3 vessel arch configuration is present. No significant atherosclerotic calcification are aneurysm is present. No stenosis is present. Right carotid system: Right common carotid artery is within. Bifurcation is within normal limits. Cervical right ICA is normal. Left carotid system: The left common carotid artery is within normal limits. Bifurcation is unremarkable. Cervical left ICA is normal. Vertebral arteries: The left vertebral artery is the dominant vessel. Both vertebral arteries originate from the subclavian arteries. No significant stenosis is present. Skeleton: Atherosclerotic calcifications are present that vertebral body heights and alignment are normal. No focal lytic or blastic lesions are present. Other neck: Soft tissues the neck unremarkable. Upper chest: The lung apices are clear. Thoracic inlet is normal. Review of the MIP images confirms the above findings CTA HEAD FINDINGS Anterior circulation: The internal carotid artery within normal limits bilaterally. ICA termini are normal. The A1 and M1 segments are normal. MCA bifurcations are within normal limits. The A1 and M1 segments are normal. The ACA and MCA branch vessels are within normal limits. No aneurysm is present. Posterior circulation: The left vertebral artery is the dominant vessel. Vertebrobasilar junction is normal. AICA vessels are  dominant. The right posterior cerebral artery originates from the basilar tip. The left posterior cerebral artery is of fetal type. PCA branch vessels are normal. No aneurysm is present. Venous sinuses: Dural sinuses are patent. The straight sinus and deep cerebral veins are intact. Cortical veins are unremarkable. Anatomic variants: Fetal type left posterior cerebral artery. Review of the MIP images confirms the above findings IMPRESSION: 1. Negative CTA of the neck. 2. Normal variant CTA Circle of Willis without significant proximal stenosis, aneurysm, or branch vessel occlusion. Electronically Signed   By: San Morelle M.D.   On: 01/01/2020 19:59   CT HEAD WO CONTRAST  Result Date: 01/01/2020 CLINICAL DATA:  Headache. EXAM: CT HEAD WITHOUT CONTRAST TECHNIQUE: Contiguous axial images were obtained from the base of the skull through the  vertex without intravenous contrast. COMPARISON:  None. FINDINGS: Brain: No evidence of acute infarction, hemorrhage, hydrocephalus, extra-axial collection or mass lesion/mass effect. Vascular: No hyperdense vessel or unexpected calcification. Skull: Normal. Negative for fracture or focal lesion. Sinuses/Orbits: Mild bilateral ethmoid and maxillary sinusitis. Other: None. IMPRESSION: Mild bilateral ethmoid and maxillary sinusitis. No acute intracranial abnormality seen. Electronically Signed   By: Marijo Conception M.D.   On: 01/01/2020 11:43   CT Angio Neck W and/or Wo Contrast  Result Date: 01/01/2020 CLINICAL DATA:  Severe headache. Normal head CT. Bloody lumbar puncture. EXAM: CT ANGIOGRAPHY HEAD AND NECK TECHNIQUE: Multidetector CT imaging of the head and neck was performed using the standard protocol during bolus administration of intravenous contrast. Multiplanar CT image reconstructions and MIPs were obtained to evaluate the vascular anatomy. Carotid stenosis measurements (when applicable) are obtained utilizing NASCET criteria, using the distal internal  carotid diameter as the denominator. CONTRAST:  24mL OMNIPAQUE IOHEXOL 350 MG/ML SOLN COMPARISON:  CT head without contrast 01/01/2019 FINDINGS: CTA NECK FINDINGS Aortic arch: 3 vessel arch configuration is present. No significant atherosclerotic calcification are aneurysm is present. No stenosis is present. Right carotid system: Right common carotid artery is within. Bifurcation is within normal limits. Cervical right ICA is normal. Left carotid system: The left common carotid artery is within normal limits. Bifurcation is unremarkable. Cervical left ICA is normal. Vertebral arteries: The left vertebral artery is the dominant vessel. Both vertebral arteries originate from the subclavian arteries. No significant stenosis is present. Skeleton: Atherosclerotic calcifications are present that vertebral body heights and alignment are normal. No focal lytic or blastic lesions are present. Other neck: Soft tissues the neck unremarkable. Upper chest: The lung apices are clear. Thoracic inlet is normal. Review of the MIP images confirms the above findings CTA HEAD FINDINGS Anterior circulation: The internal carotid artery within normal limits bilaterally. ICA termini are normal. The A1 and M1 segments are normal. MCA bifurcations are within normal limits. The A1 and M1 segments are normal. The ACA and MCA branch vessels are within normal limits. No aneurysm is present. Posterior circulation: The left vertebral artery is the dominant vessel. Vertebrobasilar junction is normal. AICA vessels are dominant. The right posterior cerebral artery originates from the basilar tip. The left posterior cerebral artery is of fetal type. PCA branch vessels are normal. No aneurysm is present. Venous sinuses: Dural sinuses are patent. The straight sinus and deep cerebral veins are intact. Cortical veins are unremarkable. Anatomic variants: Fetal type left posterior cerebral artery. Review of the MIP images confirms the above findings  IMPRESSION: 1. Negative CTA of the neck. 2. Normal variant CTA Circle of Willis without significant proximal stenosis, aneurysm, or branch vessel occlusion. Electronically Signed   By: San Morelle M.D.   On: 01/01/2020 19:59   DG Lumbar Puncture Fluoro Guide  Result Date: 01/01/2020 CLINICAL DATA:  Headache, unsuccessful lumbar puncture in the emergency room department. EXAM: DIAGNOSTIC LUMBAR PUNCTURE UNDER FLUOROSCOPIC GUIDANCE FLUOROSCOPY TIME:  Fluoroscopy Time:  0.9 minutes Number of Acquired Spot Images: 2 PROCEDURE: Informed consent was obtained from the patient prior to the procedure, including potential complications of headache, allergy, and pain. With the patient prone, the lower back was prepped with Betadine. 1% Lidocaine was used for local anesthesia. Lumbar puncture was performed at the L4-5 level level using a 16 gauge needle with return of reddish CSF. Twelve ml of CSF were obtained for laboratory studies. The patient tolerated the procedure well and there were no apparent complications. IMPRESSION:  Successful fluoroscopic guided lumbar puncture with 12 cc of reddish CSF (suggesting bloody tap) obtained and sent to the lab. Electronically Signed   By: Franki Cabot M.D.   On: 01/01/2020 17:12    EKG: Independently reviewed. NSR  Assessment/Plan Active Problems:   History of diabetes mellitus   Sudden onset of severe headache   Tachycardia  #Sudden onset of headache - CT head negative - Lumbar puncture unrevealing with no sign of infection - Consult to neurology who recommends MR brain and spine without contrast for further evaluation - Will provide patient with phenergan as needed for nausea - IV dilaudid as needed for severe pain, tylenol for mild pain, and oxycodone for moderate pain  #History of diabetes - Diet controlled - Repeat BMP in AM  #Tachycardia - Likely secondary to pain - Also has had poor appetite today and will provide IVF in form of LR at 50 ml/h  and start on regular diet to see how she tolerates  DVT prophylaxis: SCDs and early ambulation Code Status: Full Family Communication: None Disposition Plan: Home pending MRI  Consults called: Neurology called out of ED Admission status: Obs   Arlan Organ DO [facility]  01/01/2020, 11:23 PM

## 2020-01-01 NOTE — ED Triage Notes (Addendum)
Pt to triage via [facility] from home.  Reports headache since last night.  Took Tylenol last night with relief but woke up this morning with more severe headache.  Denies nausea/vomiting.  Reports dizziness and photosensitivity.  No arm drift.  Pt writhing in pain.  Reports worse headache of her life.

## 2020-01-01 NOTE — ED Provider Notes (Signed)
Sylvia Best   CSN: [id number] Arrival date & time: 01/01/20  1039     History Chief Complaint  Patient presents with  . Headache    Sylvia Best is a 39 y.o. female.  Patient is a 39 year old female with past medical history of diabetes, asthma, GERD.  She presents today for evaluation of severe headache.  This started abruptly early this a.m.  She describes a constant pressure that is in her head and face.  She reports feeling dizzy.  Pain is worse with light.  Patient denies any fevers or chills.  She denies any visual disturbances.  The history is provided by the patient.  Headache Pain location:  Generalized Quality:  Sharp and stabbing Radiates to:  Does not radiate Onset quality:  Sudden Timing:  Constant Progression:  Worsening Chronicity:  New Similar to prior headaches: no   Context: not activity   Relieved by:  Nothing Worsened by:  Nothing Ineffective treatments:  None tried      Past Medical History:  Diagnosis Date  . Asthma   . Diabetes mellitus    gestational  . GERD (gastroesophageal reflux disease)    no meds  . Gestational diabetes     Patient Active Problem List   Diagnosis Date Noted  . Screening breast examination 07/15/2019  . Breast lump on left side at 1 o'clock position 07/15/2019  . Chronic pain of right knee 12/30/2017  . Abnormal x-ray of lower extremity 12/30/2017  . Allergic rhinitis 10/13/2017  . Pelvic pain 11/06/2016  . Dyspareunia in female 11/06/2016  . S/P cesarean section 04/05/2016  . Orthostatic dizziness 12/05/2015  . Umbilical pain [id number]  . Language barrier, cultural differences 10/31/2015  . History of diabetes mellitus 10/03/2015  . AMA (advanced maternal age) multigravida 35+ 10/03/2015    Past Surgical History:  Procedure Laterality Date  . CESAREAN SECTION  04/26/2011   Procedure: CESAREAN SECTION;  Surgeon: Donnamae Jude, MD;  Location: [facility];   Service: Gynecology;  Laterality: N/A;  . CESAREAN SECTION N/A 04/05/2016   Procedure: CESAREAN SECTION;  Surgeon: Osborne Oman, MD;  Location: [facility];  Service: Obstetrics;  Laterality: N/A;  . DILATION AND CURETTAGE OF UTERUS    . TONSILLECTOMY     age 28     OB History    Gravida  4   Para  2   Term  2   Preterm  0   AB  2   Living  3     SAB  2   TAB  0   Ectopic  0   Multiple  1   Live Births  3           Family History  Problem Relation Age of Onset  . Hypertension Mother   . Heart disease Father   . Diabetes Father   . Diabetes Paternal Grandfather     Social History   Tobacco Use  . Smoking status: Never Smoker  . Smokeless tobacco: Never Used  Substance Use Topics  . Alcohol use: No  . Drug use: No    Home Medications Prior to Admission medications   Medication Sig Start Date End Date Taking? Authorizing Provider  acetaminophen (TYLENOL) 500 MG tablet Take 1 tablet (500 mg total) by mouth every 6 (six) hours as needed. Patient not taking: Reported on 11/23/2018 03/13/17   Dorena Dew, FNP  amoxicillin-clavulanate (AUGMENTIN) 875-125 MG tablet Take 1 tablet by  mouth 2 (two) times daily. 11/23/18   Lanae Boast, FNP  azithromycin (ZITHROMAX) 250 MG tablet Take 500 mg on day 1 and on days 2-5 take 250 mg 09/07/19   Dorena Dew, FNP  cetirizine (ZYRTEC) 10 MG tablet Take 1 tablet (10 mg total) by mouth daily. Patient not taking: Reported on 11/23/2018 12/29/17   Dorena Dew, FNP  etonogestrel (NEXPLANON) 68 MG IMPL implant 1 each by Subdermal route continuous.    [provider]  fluticasone (FLONASE) 50 MCG/ACT nasal spray Place 2 sprays into both nostrils daily. 11/23/18   Lanae Boast, FNP  guaiFENesin (ROBITUSSIN) 100 MG/5ML SOLN Take 5 mLs (100 mg total) by mouth every 4 (four) hours as needed for cough or to loosen phlegm. 09/07/19   Dorena Dew, FNP  meclizine (ANTIVERT) 12.5 MG tablet Take 1  tablet (12.5 mg total) by mouth 3 (three) times daily as needed for dizziness. 11/23/18   Lanae Boast, FNP  naproxen (NAPROSYN) 500 MG tablet Take 1 tablet (500 mg total) by mouth 2 (two) times daily as needed for moderate pain or headache. 11/23/18   Lanae Boast, FNP  predniSONE (STERAPRED UNI-PAK 21 TAB) 10 MG (21) TBPK tablet Take as directed on pack 11/23/18   Lanae Boast, FNP  sulfamethoxazole-trimethoprim (BACTRIM DS,SEPTRA DS) 800-160 MG tablet Take 1 tablet by mouth 2 (two) times daily. Patient not taking: Reported on 06/12/2018 01/01/18   Dorena Dew, FNP    Allergies    Patient has no known allergies.  Review of Systems   Review of Systems  Neurological: Positive for headaches.  All other systems reviewed and are negative.   Physical Exam Updated Vital Signs BP (!) 131/94   Pulse 92   Temp 97.7 F (36.5 C) (Oral)   Resp (!) 22   SpO2 100%   Physical Exam Vitals and nursing Best reviewed.  Constitutional:      General: She is not in acute distress.    Appearance: She is well-developed. She is not diaphoretic.     Comments: Patient is awake, alert, and oriented.  She appears in discomfort and is moaning and yelling.  HENT:     Head: Normocephalic and atraumatic.  Eyes:     General: No visual field deficit. Cardiovascular:     Rate and Rhythm: Normal rate and regular rhythm.     Heart sounds: No murmur. No friction rub. No gallop.   Pulmonary:     Effort: Pulmonary effort is normal. No respiratory distress.     Breath sounds: Normal breath sounds. No wheezing.  Abdominal:     General: Bowel sounds are normal. There is no distension.     Palpations: Abdomen is soft.     Tenderness: There is no abdominal tenderness.  Musculoskeletal:        General: Normal range of motion.     Cervical back: Normal range of motion and neck supple.  Skin:    General: Skin is warm and dry.  Neurological:     Mental Status: She is alert and oriented to person, place, and  time.     Cranial Nerves: No cranial nerve deficit, dysarthria or facial asymmetry.     Sensory: No sensory deficit.     ED Results / Procedures / Treatments   Labs (all labs ordered are listed, but only abnormal results are displayed) Labs Reviewed  PROTIME-INR  APTT  CBC  DIFFERENTIAL  COMPREHENSIVE METABOLIC PANEL  I-STAT BETA HCG BLOOD, ED ([place], [facility],  AP ONLY)    EKG None  Radiology CT HEAD WO CONTRAST  Result Date: 01/01/2020 CLINICAL DATA:  Headache. EXAM: CT HEAD WITHOUT CONTRAST TECHNIQUE: Contiguous axial images were obtained from the base of the skull through the vertex without intravenous contrast. COMPARISON:  None. FINDINGS: Brain: No evidence of acute infarction, hemorrhage, hydrocephalus, extra-axial collection or mass lesion/mass effect. Vascular: No hyperdense vessel or unexpected calcification. Skull: Normal. Negative for fracture or focal lesion. Sinuses/Orbits: Mild bilateral ethmoid and maxillary sinusitis. Other: None. IMPRESSION: Mild bilateral ethmoid and maxillary sinusitis. No acute intracranial abnormality seen. Electronically Signed   By: Marijo Conception M.D.   On: 01/01/2020 11:43    Procedures Procedures (including critical care time)  Medications Ordered in ED Medications  sodium chloride flush (NS) 0.9 % injection 3 mL (has no administration in time range)  ketorolac (TORADOL) 30 MG/ML injection 30 mg (has no administration in time range)  metoCLOPramide (REGLAN) injection 10 mg (has no administration in time range)  dexamethasone (DECADRON) injection 10 mg (has no administration in time range)  diphenhydrAMINE (BENADRYL) injection 25 mg (has no administration in time range)  LORazepam (ATIVAN) injection 1 mg (has no administration in time range)    ED Course  I have reviewed the triage vital signs and the nursing notes.  Pertinent labs & imaging results that were available during my care of the patient were reviewed by me and considered in my  medical decision making (see chart for details).    MDM Rules/Calculators/A&P  Patient presents here with complaints of the worst headache of her life.  She arrived here very dramatic and screaming in pain.  Patient given migraine cocktail and Ativan with some relief, but continues with significant discomfort.  Patient appears to be neurologically intact.  An attempt at an LP was made due to the degree of her discomfort, however was unsuccessful.  Patient will undergo fluoroscopy guided LP.  Results of the CSF will be followed up by the oncoming provider at shift change.  Patient otherwise appears nontoxic.  She has no fever and no white count.  There is no nuchal rigidity.  Final Clinical Impression(s) / ED Diagnoses Final diagnoses:  None    Rx / DC Orders ED Discharge Orders    None       Veryl Speak, MD 01/01/20 1551

## 2020-01-01 NOTE — ED Notes (Signed)
Notified EDP of neck discomfort.

## 2020-01-01 NOTE — Progress Notes (Signed)
  NEUROSURGERY PROGRESS NOTE   Received call from Dr Langston Masker regarding patient 39 year old female who presented with acute onset severe headache and neck pain. Head CT was obtained within 2 hours of symptom onset and negative for [facility]. LP was performed. Results were reviewed and are most consistent with bloody tap. There is also no xanthochromia in CSF. CTA head and neck also obtained and negative for vascular malformation. An aneurysmal SAH has been ruled out.  No need for continued work up from a NS perspective.   Case reviewed with attending Dr Kathyrn Sheriff who is in agreement.  Ferne Reus, PA-C [facility] [employer]

## 2020-01-01 NOTE — Procedures (Signed)
Successful fluoroscopic guided lumbar puncture.  12 cc of reddish fluid (suggesting bloody tap) removed and sent to lab.  Patient tolerated procedure well and was returned to ER in stable condition.

## 2020-01-02 ENCOUNTER — Observation Stay (HOSPITAL_COMMUNITY): Payer: BLUE CROSS/BLUE SHIELD

## 2020-01-02 DIAGNOSIS — E119 Type 2 diabetes mellitus without complications: Secondary | ICD-10-CM | POA: Diagnosis present

## 2020-01-02 DIAGNOSIS — I609 Nontraumatic subarachnoid hemorrhage, unspecified: Secondary | ICD-10-CM | POA: Diagnosis present

## 2020-01-02 DIAGNOSIS — Z8249 Family history of ischemic heart disease and other diseases of the circulatory system: Secondary | ICD-10-CM | POA: Diagnosis not present

## 2020-01-02 DIAGNOSIS — R519 Headache, unspecified: Secondary | ICD-10-CM | POA: Diagnosis not present

## 2020-01-02 DIAGNOSIS — J45909 Unspecified asthma, uncomplicated: Secondary | ICD-10-CM | POA: Diagnosis present

## 2020-01-02 DIAGNOSIS — Z793 Long term (current) use of hormonal contraceptives: Secondary | ICD-10-CM | POA: Diagnosis not present

## 2020-01-02 DIAGNOSIS — Z8632 Personal history of gestational diabetes: Secondary | ICD-10-CM | POA: Diagnosis not present

## 2020-01-02 DIAGNOSIS — K219 Gastro-esophageal reflux disease without esophagitis: Secondary | ICD-10-CM | POA: Diagnosis present

## 2020-01-02 DIAGNOSIS — G4453 Primary thunderclap headache: Secondary | ICD-10-CM | POA: Diagnosis present

## 2020-01-02 DIAGNOSIS — Z833 Family history of diabetes mellitus: Secondary | ICD-10-CM | POA: Diagnosis not present

## 2020-01-02 DIAGNOSIS — Z20822 Contact with and (suspected) exposure to covid-19: Secondary | ICD-10-CM | POA: Diagnosis present

## 2020-01-02 LAB — COMPREHENSIVE METABOLIC PANEL
ALT: 22 U/L (ref 0–44)
AST: 20 U/L (ref 15–41)
Albumin: 3.4 g/dL — ABNORMAL LOW (ref 3.5–5.0)
Alkaline Phosphatase: 34 U/L — ABNORMAL LOW (ref 38–126)
Anion gap: 13 (ref 5–15)
BUN: 8 mg/dL (ref 6–20)
CO2: 15 mmol/L — ABNORMAL LOW (ref 22–32)
Calcium: 9 mg/dL (ref 8.9–10.3)
Chloride: 108 mmol/L (ref 98–111)
Creatinine, Ser: 0.67 mg/dL (ref 0.44–1.00)
GFR calc Af Amer: >60 mL/min (ref >60)
GFR calc non Af Amer: >60 mL/min (ref >60)
Glucose, Bld: 194 mg/dL — ABNORMAL HIGH (ref 70–99)
Potassium: 3.6 mmol/L (ref 3.5–5.1)
Sodium: 136 mmol/L (ref 135–145)
Total Bilirubin: 0.4 mg/dL (ref 0.3–1.2)
Total Protein: 6.6 g/dL (ref 6.5–8.1)

## 2020-01-02 LAB — GLUCOSE, CAPILLARY: Glucose-Capillary: 147 mg/dL — ABNORMAL HIGH (ref 70–99)

## 2020-01-02 LAB — CBC
HCT: 38.8 % (ref 36.0–46.0)
Hemoglobin: 13.1 g/dL (ref 12.0–15.0)
MCH: 31.6 pg (ref 26.0–34.0)
MCHC: 33.8 g/dL (ref 30.0–36.0)
MCV: 93.7 fL (ref 80.0–100.0)
Platelets: 300 10*3/uL (ref 150–400)
RBC: 4.14 MIL/uL (ref 3.87–5.11)
RDW: 11.9 % (ref 11.5–15.5)
WBC: 12 10*3/uL — ABNORMAL HIGH (ref 4.0–10.5)
nRBC: 0 % (ref 0.0–0.2)

## 2020-01-02 LAB — HIV ANTIBODY (ROUTINE TESTING W REFLEX): HIV Screen 4th Generation wRfx: NONREACTIVE

## 2020-01-02 LAB — SARS CORONAVIRUS 2 (TAT 6-24 HRS): SARS Coronavirus 2: NEGATIVE

## 2020-01-02 LAB — SEDIMENTATION RATE: Sed Rate: 20 mm/hr (ref 0–22)

## 2020-01-02 MED ORDER — DIPHENHYDRAMINE HCL 50 MG/ML IJ SOLN
25.0000 mg | Freq: Two times a day (BID) | INTRAMUSCULAR | Status: DC
  Administered 2020-01-02 – 2020-01-05 (×6): 25 mg via INTRAVENOUS
  Filled 2020-01-02 (×6): qty 1

## 2020-01-02 MED ORDER — KETOROLAC TROMETHAMINE 30 MG/ML IJ SOLN
30.0000 mg | Freq: Two times a day (BID) | INTRAMUSCULAR | Status: DC
  Administered 2020-01-02 – 2020-01-03 (×3): 30 mg via INTRAVENOUS
  Filled 2020-01-02 (×3): qty 1

## 2020-01-02 MED ORDER — PROCHLORPERAZINE EDISYLATE 10 MG/2ML IJ SOLN
10.0000 mg | Freq: Four times a day (QID) | INTRAMUSCULAR | Status: DC
  Administered 2020-01-02 – 2020-01-05 (×13): 10 mg via INTRAVENOUS
  Filled 2020-01-02 (×13): qty 2

## 2020-01-02 MED ORDER — DIVALPROEX SODIUM 250 MG PO DR TAB
1000.0000 mg | DELAYED_RELEASE_TABLET | Freq: Once | ORAL | Status: AC
  Administered 2020-01-02: 11:00:00 1000 mg via ORAL
  Filled 2020-01-02: qty 4

## 2020-01-02 MED ORDER — DIPHENHYDRAMINE HCL 25 MG PO CAPS
25.0000 mg | ORAL_CAPSULE | Freq: Four times a day (QID) | ORAL | Status: DC | PRN
  Administered 2020-01-02: 25 mg via ORAL
  Filled 2020-01-02: qty 1

## 2020-01-02 NOTE — ED Provider Notes (Signed)
Clinical Course as of Jan 02 1139  Sat Jan 01, 2020  1603 Pt signed out to me by Dr Stark Jock.  Briefly 39 yo female presenting with headache acute onset today vs yesterday evening.  Received migraine cocktail here with some improvement.  EDP attempted 2 LP's unsuccessfully, pending fluoroscopy LP to evaluate for [facility] given possibility of onset > 6 hours ago, EDP has discussed with radiology who is setting up the procedure now.  She is afebrile with no nuccal rigidity to suggest meningitis at this time, no leukocytosis.   [MT]  1805 Glucose, CSF(!): 88 [MT]  1806 Total  Protein, CSF(!): 142 [MT]  1806 My reassessment of the patient.  She reports that her pain is still about a 6 out of 10.  She feels like it is overall better than when she presented.  She describes pain in her neck and difficulty with touching her chin to her chest.  She denies any blurred vision or vision changes.  She does confirm to me that the onset of her symptom was at 930 AM this morning.  She states that she was taking a shower and had abrupt onset pain with tunnel vision and lightheadedness.  Her CT scan was performed well within the 6-hour window.  To pick up an acute brain bleed.  There is no bleed noted at that time.  However we will follow up on her CSF cell count which is pending.   [MT]  1911 RBC Count, CSF(!): 160,250 [MT]  1912 Paged neurosurgery and neurology regarding CSF results.  Significantly elevated RBC count on tube 3 most concerning for brain bleed.   [MT]  1916 Patient without nausea, vomiting, or evidence of pupillary dilation suggestive of herniation at this time.   [MT]  1926 Spoke to Dr. Jolaine Artist from neurology recommended going for CT angiogram of the head and neck to evaluate for aneurysm.  Also recommended a potential FLAIR sequence MRI.  I am awaiting a callback from the neurosurgeon.   [MT]  1942 Nsgy to come see patient   [MT]  2008 IMPRESSION: 1. Negative CTA of the neck. 2. Normal variant CTA Circle  of Willis without significant proximal stenosis, aneurysm, or branch vessel occlusion.    [MT]  2008 I spoke to the interpreting radiologist for CT angio.  He reports that her red blood cell count of that elevation may be consistent with a bloody or traumatic LP, which appeared to have been the case from the diagnostic radiology team.   [MT]    Clinical Course User Index [MT] Wyvonnia Dusky, MD   Following the patient's CTA, I spoke to Dr Cheral Marker again who recommended hospital admission and MRI of the brain and spine (noncontrast) to available for possible spinal source of bleeding and to complete her workup.  I relayed this to the patient and her husband who agreed.  She reported her headache was "better but returning."  Signout was given to the hospitalist.   Wyvonnia Dusky, MD 01/02/20 1141

## 2020-01-02 NOTE — Progress Notes (Signed)
PROGRESS NOTE    Sylvia Best  VXY:[id number] DOB: 05-28-1981 DOA: 01/01/2020 PCP: Lanae Boast, FNP   Brief Narrative:  39 year old with history of DM2, asthma, GERD presents to the hospital complaining of pretty severe frontal headache with neck stiffness.  CT of the head negative, LP was overall unremarkable.  MRI of the brain was normal.  Seen by neurology team started on typical headache cocktail.   Assessment & Plan:   Principal Problem:   Sudden onset of severe headache Active Problems:   History of diabetes mellitus   Tachycardia  Sudden severe headache mostly left-sided with neck stiffness and intermittent numbness of the fingers -CT head, LP and MRI brain-negative for acute pathology. -Neurology team following -We will check ESR.  CRP -Continue Phenergan, magnesium, Benadryl, steroids and fluids  Diet controlled diabetes mellitus type 2 likely supportive care   DVT prophylaxis: SCDs Code Status: Full code Family Communication: Friends at bedside home Disposition Plan:   Patient From= home  Patient Anticipated D/C place= home  Barriers= still having significant amount of headache with upper extremity numbness.  Neurology team following, will discharge once cleared by their service.    Subjective: Reports of significant amount of left-sided frontal and occipital headache with neck stiffness.  Reports intermittently she feels upper extremity fingertips and tongue numbness.  Review of Systems Otherwise negative except as per HPI, including: General: Denies fever, chills, night sweats or unintended weight loss. Resp: Denies cough, wheezing, shortness of breath. Cardiac: Denies chest pain, palpitations, orthopnea, paroxysmal nocturnal dyspnea. GI: Denies abdominal pain, nausea, vomiting, diarrhea or constipation GU: Denies dysuria, frequency, hesitancy or incontinence MS: Denies muscle aches, joint pain or swelling Neuro: Denies   abnormal gait Psych: Denies  anxiety, depression, SI/HI/AVH Skin: Denies new rashes or lesions ID: Denies sick contacts, exotic exposures, travel  Examination:  Constitutional: Mild distress due to headaches Respiratory: Clear to auscultation bilaterally Cardiovascular: Normal sinus rhythm, no rubs Abdomen: Nontender nondistended good bowel sounds Musculoskeletal: No edema noted Skin: No rashes seen Neurologic: CN 2-12 grossly intact.  And nonfocal Psychiatric: Normal judgment and insight. Alert and oriented x 3. Normal mood.  Objective: Vitals:   01/02/20 0045 01/02/20 0317 01/02/20 0413 01/02/20 0958  BP:  119/72  111/79  Pulse: (!) 121 (!) 102  91  Resp: (!) '23 17  16  ' Temp:  98.7 F (37.1 C)  99.1 F (37.3 C)  TempSrc:  Oral  Oral  SpO2: 100% 100%  100%  Weight:   63.4 kg   Height:   '5\' 4"'  (1.626 m)     Intake/Output Summary (Last 24 hours) at 01/02/2020 1003 Last data filed at 01/02/2020 0400 Gross per 24 hour  Intake 1146.77 ml  Output --  Net 1146.77 ml   Filed Weights   01/02/20 0413  Weight: 63.4 kg     Data Reviewed:   CBC: Recent Labs  Lab 01/01/20 1106 01/02/20 0449  WBC 4.9 12.0*  NEUTROABS 2.0  --   HGB 13.5 13.1  HCT 39.9 38.8  MCV 93.7 93.7  PLT 255 827   Basic Metabolic Panel: Recent Labs  Lab 01/01/20 1106 01/02/20 0449  NA 136 136  K 3.8 3.6  CL 107 108  CO2 20* 15*  GLUCOSE 157* 194*  BUN 8 8  CREATININE 0.62 0.67  CALCIUM 8.9 9.0   GFR: Estimated Creatinine Clearance: 81.5 mL/min (by C-G formula based on SCr of 0.67 mg/dL). Liver Function Tests: Recent Labs  Lab 01/01/20 1106 01/02/20 0449  AST 24 20  ALT 24 22  ALKPHOS 36* 34*  BILITOT 0.5 0.4  PROT 7.1 6.6  ALBUMIN 3.7 3.4*   No results for input(s): LIPASE, AMYLASE in the last 168 hours. No results for input(s): AMMONIA in the last 168 hours. Coagulation Profile: Recent Labs  Lab 01/01/20 1106  INR 1.1   Cardiac Enzymes: No results for input(s): CKTOTAL, CKMB, CKMBINDEX, TROPONINI  in the last 168 hours. BNP (last 3 results) No results for input(s): PROBNP in the last 8760 hours. HbA1C: No results for input(s): HGBA1C in the last 72 hours. CBG: Recent Labs  Lab 01/02/20 0617  GLUCAP 147*   Lipid Profile: No results for input(s): CHOL, HDL, LDLCALC, TRIG, CHOLHDL, LDLDIRECT in the last 72 hours. Thyroid Function Tests: No results for input(s): TSH, T4TOTAL, FREET4, T3FREE, THYROIDAB in the last 72 hours. Anemia Panel: No results for input(s): VITAMINB12, FOLATE, FERRITIN, TIBC, IRON, RETICCTPCT in the last 72 hours. Sepsis Labs: No results for input(s): PROCALCITON, LATICACIDVEN in the last 168 hours.  Recent Results (from the past 240 hour(s))  CSF culture     Status: None (Preliminary result)   Collection Time: 01/01/20  4:58 PM   Specimen: PATH Cytology CSF; Cerebrospinal Fluid  Result Value Ref Range Status   Specimen Description CSF  Final   Special Requests NONE  Final   Gram Stain   Final    CYTOSPIN SMEAR WBC PRESENT, PREDOMINANTLY PMN NO WBC SEEN Performed at [facility], [address] [place], [place] [address]    Culture PENDING  Incomplete   Report Status PENDING  Incomplete  SARS CORONAVIRUS 2 (TAT 6-24 HRS) Nasopharyngeal Nasopharyngeal Swab     Status: None   Collection Time: 01/02/20 12:07 AM   Specimen: Nasopharyngeal Swab  Result Value Ref Range Status   SARS Coronavirus 2 NEGATIVE NEGATIVE Final    Comment: (NOTE) SARS-CoV-2 target nucleic acids are NOT DETECTED. The SARS-CoV-2 RNA is generally detectable in upper and lower respiratory specimens during the acute phase of infection. Negative results do not preclude SARS-CoV-2 infection, do not rule out co-infections with other pathogens, and should not be used as the sole basis for treatment or other patient management decisions. Negative results must be combined with clinical observations, patient history, and epidemiological information. The expected result is  Negative. Fact Sheet for Patients: SugarRoll.be Fact Sheet for Healthcare Providers: https://www.woods-mathews.com/ This test is not yet approved or cleared by the [place] FDA and  has been authorized for detection and/or diagnosis of SARS-CoV-2 by FDA under an Emergency Use Authorization (EUA). This EUA will remain  in effect (meaning this test can be used) for the duration of the COVID-19 declaration under Section 56 4(b)(1) of the Act, 21 U.S.C. section 360bbb-3(b)(1), unless the authorization is terminated or revoked sooner. Performed at [facility], [place] [address], [place], [place] [address]          Radiology Studies: CT Angio Head W or Wo Contrast  Result Date: 01/01/2020 CLINICAL DATA:  Severe headache. Normal head CT. Bloody lumbar puncture. EXAM: CT ANGIOGRAPHY HEAD AND NECK TECHNIQUE: Multidetector CT imaging of the head and neck was performed using the standard protocol during bolus administration of intravenous contrast. Multiplanar CT image reconstructions and MIPs were obtained to evaluate the vascular anatomy. Carotid stenosis measurements (when applicable) are obtained utilizing NASCET criteria, using the distal internal carotid diameter as the denominator. CONTRAST:  61m OMNIPAQUE IOHEXOL 350 MG/ML SOLN COMPARISON:  CT head without contrast  01/01/2019 FINDINGS: CTA NECK FINDINGS Aortic arch: 3 vessel arch configuration is present. No significant atherosclerotic calcification are aneurysm is present. No stenosis is present. Right carotid system: Right common carotid artery is within. Bifurcation is within normal limits. Cervical right ICA is normal. Left carotid system: The left common carotid artery is within normal limits. Bifurcation is unremarkable. Cervical left ICA is normal. Vertebral arteries: The left vertebral artery is the dominant vessel. Both vertebral arteries originate from the subclavian arteries. No  significant stenosis is present. Skeleton: Atherosclerotic calcifications are present that vertebral body heights and alignment are normal. No focal lytic or blastic lesions are present. Other neck: Soft tissues the neck unremarkable. Upper chest: The lung apices are clear. Thoracic inlet is normal. Review of the MIP images confirms the above findings CTA HEAD FINDINGS Anterior circulation: The internal carotid artery within normal limits bilaterally. ICA termini are normal. The A1 and M1 segments are normal. MCA bifurcations are within normal limits. The A1 and M1 segments are normal. The ACA and MCA branch vessels are within normal limits. No aneurysm is present. Posterior circulation: The left vertebral artery is the dominant vessel. Vertebrobasilar junction is normal. AICA vessels are dominant. The right posterior cerebral artery originates from the basilar tip. The left posterior cerebral artery is of fetal type. PCA branch vessels are normal. No aneurysm is present. Venous sinuses: Dural sinuses are patent. The straight sinus and deep cerebral veins are intact. Cortical veins are unremarkable. Anatomic variants: Fetal type left posterior cerebral artery. Review of the MIP images confirms the above findings IMPRESSION: 1. Negative CTA of the neck. 2. Normal variant CTA Circle of Willis without significant proximal stenosis, aneurysm, or branch vessel occlusion. Electronically Signed   By: San Morelle M.D.   On: 01/01/2020 19:59   CT HEAD WO CONTRAST  Result Date: 01/01/2020 CLINICAL DATA:  Headache. EXAM: CT HEAD WITHOUT CONTRAST TECHNIQUE: Contiguous axial images were obtained from the base of the skull through the vertex without intravenous contrast. COMPARISON:  None. FINDINGS: Brain: No evidence of acute infarction, hemorrhage, hydrocephalus, extra-axial collection or mass lesion/mass effect. Vascular: No hyperdense vessel or unexpected calcification. Skull: Normal. Negative for fracture or  focal lesion. Sinuses/Orbits: Mild bilateral ethmoid and maxillary sinusitis. Other: None. IMPRESSION: Mild bilateral ethmoid and maxillary sinusitis. No acute intracranial abnormality seen. Electronically Signed   By: Marijo Conception M.D.   On: 01/01/2020 11:43   CT Angio Neck W and/or Wo Contrast  Result Date: 01/01/2020 CLINICAL DATA:  Severe headache. Normal head CT. Bloody lumbar puncture. EXAM: CT ANGIOGRAPHY HEAD AND NECK TECHNIQUE: Multidetector CT imaging of the head and neck was performed using the standard protocol during bolus administration of intravenous contrast. Multiplanar CT image reconstructions and MIPs were obtained to evaluate the vascular anatomy. Carotid stenosis measurements (when applicable) are obtained utilizing NASCET criteria, using the distal internal carotid diameter as the denominator. CONTRAST:  36m OMNIPAQUE IOHEXOL 350 MG/ML SOLN COMPARISON:  CT head without contrast 01/01/2019 FINDINGS: CTA NECK FINDINGS Aortic arch: 3 vessel arch configuration is present. No significant atherosclerotic calcification are aneurysm is present. No stenosis is present. Right carotid system: Right common carotid artery is within. Bifurcation is within normal limits. Cervical right ICA is normal. Left carotid system: The left common carotid artery is within normal limits. Bifurcation is unremarkable. Cervical left ICA is normal. Vertebral arteries: The left vertebral artery is the dominant vessel. Both vertebral arteries originate from the subclavian arteries. No significant stenosis is present. Skeleton: Atherosclerotic calcifications  are present that vertebral body heights and alignment are normal. No focal lytic or blastic lesions are present. Other neck: Soft tissues the neck unremarkable. Upper chest: The lung apices are clear. Thoracic inlet is normal. Review of the MIP images confirms the above findings CTA HEAD FINDINGS Anterior circulation: The internal carotid artery within normal limits  bilaterally. ICA termini are normal. The A1 and M1 segments are normal. MCA bifurcations are within normal limits. The A1 and M1 segments are normal. The ACA and MCA branch vessels are within normal limits. No aneurysm is present. Posterior circulation: The left vertebral artery is the dominant vessel. Vertebrobasilar junction is normal. AICA vessels are dominant. The right posterior cerebral artery originates from the basilar tip. The left posterior cerebral artery is of fetal type. PCA branch vessels are normal. No aneurysm is present. Venous sinuses: Dural sinuses are patent. The straight sinus and deep cerebral veins are intact. Cortical veins are unremarkable. Anatomic variants: Fetal type left posterior cerebral artery. Review of the MIP images confirms the above findings IMPRESSION: 1. Negative CTA of the neck. 2. Normal variant CTA Circle of Willis without significant proximal stenosis, aneurysm, or branch vessel occlusion. Electronically Signed   By: San Morelle M.D.   On: 01/01/2020 19:59   MR BRAIN WO CONTRAST  Addendum Date: 01/02/2020   ADDENDUM REPORT: 01/02/2020 05:40 ADDENDUM: Case was discussed with Dr. Kerney Elbe at 5:30 a.m. on 01/02/2019. There is hyperintense signal on the axial FLAIR sequence within the interpeduncular cistern and right ambient cistern. This is favored to be secondary to a CSF pulsation artifact. There is no corresponding abnormality on any other sequence and this is a common location for failure of fluid signal suppression. Given the unusual clinical presentation, if there is continued clinical concern for subarachnoid hemorrhage, a 3D FLAIR acquisition should be able to eliminate this artifact. Alternatively, repeating the noncontrast head CT should also be able to exclude perimesencephalic hemorrhage. Electronically Signed   By: Ulyses Jarred M.D.   On: 01/02/2020 05:40   Result Date: 01/02/2020 CLINICAL DATA:  Thunderclap headache EXAM: MRI HEAD WITHOUT  CONTRAST TECHNIQUE: Multiplanar, multiecho pulse sequences of the brain and surrounding structures were obtained without intravenous contrast. COMPARISON:  None. FINDINGS: BRAIN: No acute infarct, acute hemorrhage or extra-axial collection. Normal white matter signal for age. Normal volume of brain parenchyma and CSF spaces. Midline structures are normal. VASCULAR: Major flow voids are preserved. Susceptibility-sensitive sequences show no chronic microhemorrhage or superficial siderosis. SKULL AND UPPER CERVICAL SPINE: Normal calvarium and skull base. Visualized upper cervical spine and soft tissues are normal. SINUSES/ORBITS: No paranasal sinus fluid levels or advanced mucosal thickening. No mastoid or middle ear effusion. Normal orbits. IMPRESSION: Normal brain MRI. Electronically Signed: By: Ulyses Jarred M.D. On: 01/02/2020 02:55   MR CERVICAL SPINE WO CONTRAST  Result Date: 01/02/2020 CLINICAL DATA:  Severe headache EXAM: MRI CERVICAL, THORACIC AND LUMBAR SPINE WITHOUT CONTRAST TECHNIQUE: Multiplanar and multiecho pulse sequences of the cervical spine, to include the craniocervical junction and cervicothoracic junction, and thoracic and lumbar spine, were obtained without intravenous contrast. COMPARISON:  None. FINDINGS: MRI CERVICAL SPINE FINDINGS Alignment: Physiologic. Vertebrae: No fracture, evidence of discitis, or bone lesion. Cord: Normal signal and morphology. Posterior Fossa, vertebral arteries, paraspinal tissues: Negative. Disc levels: No disc herniation or stenosis MRI THORACIC SPINE FINDINGS Alignment:  Physiologic. Vertebrae: No fracture, evidence of discitis, or bone lesion. Cord:  Normal signal and morphology. Paraspinal and other soft tissues: Negative. Disc levels: No disc herniation  or stenosis MRI LUMBAR SPINE FINDINGS Segmentation:  Standard. Alignment:  Physiologic. Vertebrae:  No fracture, evidence of discitis, or bone lesion. Conus medullaris and cauda equina: Conus extends to the  L1 level. Conus and cauda equina appear normal. Paraspinal and other soft tissues: Negative Disc levels: Mild left asymmetric disc bulge at L4-5 with narrowing of the left lateral recess. The other disc levels are normal. IMPRESSION: 1. No acute abnormality. 2. Normal cervical and thoracic spine. 3. L4-5 left asymmetric disc bulge with narrowing of the left lateral recess. Correlate for L4 radiculopathy. Electronically Signed   By: Ulyses Jarred M.D.   On: 01/02/2020 03:15   MR THORACIC SPINE WO CONTRAST  Result Date: 01/02/2020 CLINICAL DATA:  Severe headache EXAM: MRI CERVICAL, THORACIC AND LUMBAR SPINE WITHOUT CONTRAST TECHNIQUE: Multiplanar and multiecho pulse sequences of the cervical spine, to include the craniocervical junction and cervicothoracic junction, and thoracic and lumbar spine, were obtained without intravenous contrast. COMPARISON:  None. FINDINGS: MRI CERVICAL SPINE FINDINGS Alignment: Physiologic. Vertebrae: No fracture, evidence of discitis, or bone lesion. Cord: Normal signal and morphology. Posterior Fossa, vertebral arteries, paraspinal tissues: Negative. Disc levels: No disc herniation or stenosis MRI THORACIC SPINE FINDINGS Alignment:  Physiologic. Vertebrae: No fracture, evidence of discitis, or bone lesion. Cord:  Normal signal and morphology. Paraspinal and other soft tissues: Negative. Disc levels: No disc herniation or stenosis MRI LUMBAR SPINE FINDINGS Segmentation:  Standard. Alignment:  Physiologic. Vertebrae:  No fracture, evidence of discitis, or bone lesion. Conus medullaris and cauda equina: Conus extends to the L1 level. Conus and cauda equina appear normal. Paraspinal and other soft tissues: Negative Disc levels: Mild left asymmetric disc bulge at L4-5 with narrowing of the left lateral recess. The other disc levels are normal. IMPRESSION: 1. No acute abnormality. 2. Normal cervical and thoracic spine. 3. L4-5 left asymmetric disc bulge with narrowing of the left lateral  recess. Correlate for L4 radiculopathy. Electronically Signed   By: Ulyses Jarred M.D.   On: 01/02/2020 03:15   MR LUMBAR SPINE WO CONTRAST  Result Date: 01/02/2020 CLINICAL DATA:  Severe headache EXAM: MRI CERVICAL, THORACIC AND LUMBAR SPINE WITHOUT CONTRAST TECHNIQUE: Multiplanar and multiecho pulse sequences of the cervical spine, to include the craniocervical junction and cervicothoracic junction, and thoracic and lumbar spine, were obtained without intravenous contrast. COMPARISON:  None. FINDINGS: MRI CERVICAL SPINE FINDINGS Alignment: Physiologic. Vertebrae: No fracture, evidence of discitis, or bone lesion. Cord: Normal signal and morphology. Posterior Fossa, vertebral arteries, paraspinal tissues: Negative. Disc levels: No disc herniation or stenosis MRI THORACIC SPINE FINDINGS Alignment:  Physiologic. Vertebrae: No fracture, evidence of discitis, or bone lesion. Cord:  Normal signal and morphology. Paraspinal and other soft tissues: Negative. Disc levels: No disc herniation or stenosis MRI LUMBAR SPINE FINDINGS Segmentation:  Standard. Alignment:  Physiologic. Vertebrae:  No fracture, evidence of discitis, or bone lesion. Conus medullaris and cauda equina: Conus extends to the L1 level. Conus and cauda equina appear normal. Paraspinal and other soft tissues: Negative Disc levels: Mild left asymmetric disc bulge at L4-5 with narrowing of the left lateral recess. The other disc levels are normal. IMPRESSION: 1. No acute abnormality. 2. Normal cervical and thoracic spine. 3. L4-5 left asymmetric disc bulge with narrowing of the left lateral recess. Correlate for L4 radiculopathy. Electronically Signed   By: Ulyses Jarred M.D.   On: 01/02/2020 03:15   DG Lumbar Puncture Fluoro Guide  Result Date: 01/01/2020 CLINICAL DATA:  Headache, unsuccessful lumbar puncture in the emergency room department.  EXAM: DIAGNOSTIC LUMBAR PUNCTURE UNDER FLUOROSCOPIC GUIDANCE FLUOROSCOPY TIME:  Fluoroscopy Time:  0.9  minutes Number of Acquired Spot Images: 2 PROCEDURE: Informed consent was obtained from the patient prior to the procedure, including potential complications of headache, allergy, and pain. With the patient prone, the lower back was prepped with Betadine. 1% Lidocaine was used for local anesthesia. Lumbar puncture was performed at the L4-5 level level using a 16 gauge needle with return of reddish CSF. Twelve ml of CSF were obtained for laboratory studies. The patient tolerated the procedure well and there were no apparent complications. IMPRESSION: Successful fluoroscopic guided lumbar puncture with 12 cc of reddish CSF (suggesting bloody tap) obtained and sent to the lab. Electronically Signed   By: Franki Cabot M.D.   On: 01/01/2020 17:12        Scheduled Meds: . loratadine  10 mg Oral Daily  . sodium chloride flush  3 mL Intravenous Once  . sodium chloride flush  3 mL Intravenous Q12H   Continuous Infusions: . lactated ringers 50 mL/hr at 01/02/20 0400     LOS: 0 days   Time spent= 35 mins    Mykeal Carrick Arsenio Loader, MD [facility]  If 7PM-7AM, please contact night-coverage  01/02/2020, 10:03 AM

## 2020-01-02 NOTE — Consult Note (Addendum)
NEURO HOSPITALIST CONSULT NOTE   Requestig physician: Dr. Gloriann Loan  Reason for Consult: Possible subarachnoid hemorrhage.   History obtained from:   Patient, Husband and Chart     HPI:                                                                                                                                          Sylvia Best is an 39 y.o. female with a PMHx of gestational DM and asthma, presenting to the ED after abrupt onset of 10/10 thunderclap headache on a baseline of a milder chronic headache that has been ongoing since Friday night. The pain involves her head and face. She had some relief with Tylenol on Friday, but woke up on Saturday morning with a more severe headache. No N/V. She has had sensation of dizziness as well as photophobia. No limb weakness. She was crying and writhing in pain on arrival, stating that this was the worst headache of her life.   Fluoroscopic LP was obtained, revealing no xanthochromia but with markedly elevated RBCs as well as elevated WBC and protein. However, correction of WBC and protein for the RBCs results in normal values for the former. The LP results are compatible with either an unusually high RBC content of a bloody tap, versus acute subarachnoid hemorrhage.   CT head showed no hemorrhage.   CTA of head and neck revealed no aneurysm or dissection.   Past Medical History:  Diagnosis Date  . Asthma   . Diabetes mellitus    gestational  . GERD (gastroesophageal reflux disease)    no meds  . Gestational diabetes     Past Surgical History:  Procedure Laterality Date  . CESAREAN SECTION  04/26/2011   Procedure: CESAREAN SECTION;  Surgeon: Donnamae Jude, MD;  Location: [facility];  Service: Gynecology;  Laterality: N/A;  . CESAREAN SECTION N/A 04/05/2016   Procedure: CESAREAN SECTION;  Surgeon: Osborne Oman, MD;  Location: [facility];  Service: Obstetrics;  Laterality: N/A;  . DILATION AND CURETTAGE OF UTERUS    .  TONSILLECTOMY     age 19    Family History  Problem Relation Age of Onset  . Hypertension Mother   . Heart disease Father   . Diabetes Father   . Diabetes Paternal Grandfather               Social History:  reports that she has never smoked. She has never used smokeless tobacco. She reports that she does not drink alcohol or use drugs.  No Known Allergies  HOME MEDICATIONS:  No current facility-administered medications on file prior to encounter.   Current Outpatient Medications on File Prior to Encounter  Medication Sig Dispense Refill  . acetaminophen (TYLENOL) 500 MG tablet Take 1 tablet (500 mg total) by mouth every 6 (six) hours as needed. 30 tablet 0  . etonogestrel (NEXPLANON) 68 MG IMPL implant 1 each by Subdermal route continuous.    . fluticasone (FLONASE) 50 MCG/ACT nasal spray Place 2 sprays into both nostrils daily. 16 g 6  . cetirizine (ZYRTEC) 10 MG tablet Take 1 tablet (10 mg total) by mouth daily. (Patient not taking: Reported on 11/23/2018) 30 tablet 11  . meclizine (ANTIVERT) 12.5 MG tablet Take 1 tablet (12.5 mg total) by mouth 3 (three) times daily as needed for dizziness. (Patient not taking: Reported on 01/01/2020) 30 tablet 1  . naproxen (NAPROSYN) 500 MG tablet Take 1 tablet (500 mg total) by mouth 2 (two) times daily as needed for moderate pain or headache. (Patient not taking: Reported on 01/01/2020) 30 tablet 0     ROS:                                                                                                                                       As per HPI. Comprehensive ROS otherwise negative.    Blood pressure 136/81, pulse (!) 121, temperature 97.7 F (36.5 C), temperature source Oral, resp. rate (!) 23, SpO2 100 %, currently breastfeeding.   General Examination:                                                                                                        Physical Exam  HEENT-  [place]/AT. There is mild to moderate nuchal rigidity in flexion but not rotation.  Lungs- Respirations unlabored Extremities- No edema  Neurological Examination Mental Status: Alert, oriented, thought content appropriate, but with somewhat pained affect.  Speech fluent without evidence of aphasia.  Able to follow all commands without difficulty. Cranial Nerves: II: Visual fields intact with no extinction to DSS. PERRL.  III,IV, VI: No ptosis. EOMI. No nystagmus.  V,VII: Smile symmetric, facial temp sensation equal bilaterally VIII: hearing intact to voice IX,X: No hypophonia XI: Symmetric XII: Midline tongue extension Motor: Right : Upper extremity   5/5    Left:     Upper extremity   5/5  Lower extremity   5/5     Lower extremity   5/5 Normal tone throughout; no atrophy noted No pronator drift. Sensory: Temp and light  touch intact in all 4 extremities Deep Tendon Reflexes: 2+ and symmetric throughout Plantars: Right: downgoing   Left: downgoing Cerebellar: No ataxia with FNF bilaterally  Gait: Deferred Other: Neck pain is elicited with straight-leg raise on the right   Lab Results: Basic Metabolic Panel: Recent Labs  Lab 01/01/20 1106  NA 136  K 3.8  CL 107  CO2 20*  GLUCOSE 157*  BUN 8  CREATININE 0.62  CALCIUM 8.9    CBC: Recent Labs  Lab 01/01/20 1106  WBC 4.9  NEUTROABS 2.0  HGB 13.5  HCT 39.9  MCV 93.7  PLT 255    Cardiac Enzymes: No results for input(s): CKTOTAL, CKMB, CKMBINDEX, TROPONINI in the last 168 hours.  Lipid Panel: No results for input(s): CHOL, TRIG, HDL, CHOLHDL, VLDL, LDLCALC in the last 168 hours.  Imaging: CT Angio Head W or Wo Contrast  Result Date: 01/01/2020 CLINICAL DATA:  Severe headache. Normal head CT. Bloody lumbar puncture. EXAM: CT ANGIOGRAPHY HEAD AND NECK TECHNIQUE: Multidetector CT imaging of the head and neck was performed using the standard protocol during bolus  administration of intravenous contrast. Multiplanar CT image reconstructions and MIPs were obtained to evaluate the vascular anatomy. Carotid stenosis measurements (when applicable) are obtained utilizing NASCET criteria, using the distal internal carotid diameter as the denominator. CONTRAST:  25mL OMNIPAQUE IOHEXOL 350 MG/ML SOLN COMPARISON:  CT head without contrast 01/01/2019 FINDINGS: CTA NECK FINDINGS Aortic arch: 3 vessel arch configuration is present. No significant atherosclerotic calcification are aneurysm is present. No stenosis is present. Right carotid system: Right common carotid artery is within. Bifurcation is within normal limits. Cervical right ICA is normal. Left carotid system: The left common carotid artery is within normal limits. Bifurcation is unremarkable. Cervical left ICA is normal. Vertebral arteries: The left vertebral artery is the dominant vessel. Both vertebral arteries originate from the subclavian arteries. No significant stenosis is present. Skeleton: Atherosclerotic calcifications are present that vertebral body heights and alignment are normal. No focal lytic or blastic lesions are present. Other neck: Soft tissues the neck unremarkable. Upper chest: The lung apices are clear. Thoracic inlet is normal. Review of the MIP images confirms the above findings CTA HEAD FINDINGS Anterior circulation: The internal carotid artery within normal limits bilaterally. ICA termini are normal. The A1 and M1 segments are normal. MCA bifurcations are within normal limits. The A1 and M1 segments are normal. The ACA and MCA branch vessels are within normal limits. No aneurysm is present. Posterior circulation: The left vertebral artery is the dominant vessel. Vertebrobasilar junction is normal. AICA vessels are dominant. The right posterior cerebral artery originates from the basilar tip. The left posterior cerebral artery is of fetal type. PCA branch vessels are normal. No aneurysm is present.  Venous sinuses: Dural sinuses are patent. The straight sinus and deep cerebral veins are intact. Cortical veins are unremarkable. Anatomic variants: Fetal type left posterior cerebral artery. Review of the MIP images confirms the above findings IMPRESSION: 1. Negative CTA of the neck. 2. Normal variant CTA Circle of Willis without significant proximal stenosis, aneurysm, or branch vessel occlusion. Electronically Signed   By: San Morelle M.D.   On: 01/01/2020 19:59   CT HEAD WO CONTRAST  Result Date: 01/01/2020 CLINICAL DATA:  Headache. EXAM: CT HEAD WITHOUT CONTRAST TECHNIQUE: Contiguous axial images were obtained from the base of the skull through the vertex without intravenous contrast. COMPARISON:  None. FINDINGS: Brain: No evidence of acute infarction, hemorrhage, hydrocephalus, extra-axial collection or mass lesion/mass effect.  Vascular: No hyperdense vessel or unexpected calcification. Skull: Normal. Negative for fracture or focal lesion. Sinuses/Orbits: Mild bilateral ethmoid and maxillary sinusitis. Other: None. IMPRESSION: Mild bilateral ethmoid and maxillary sinusitis. No acute intracranial abnormality seen. Electronically Signed   By: Marijo Conception M.D.   On: 01/01/2020 11:43   CT Angio Neck W and/or Wo Contrast  Result Date: 01/01/2020 CLINICAL DATA:  Severe headache. Normal head CT. Bloody lumbar puncture. EXAM: CT ANGIOGRAPHY HEAD AND NECK TECHNIQUE: Multidetector CT imaging of the head and neck was performed using the standard protocol during bolus administration of intravenous contrast. Multiplanar CT image reconstructions and MIPs were obtained to evaluate the vascular anatomy. Carotid stenosis measurements (when applicable) are obtained utilizing NASCET criteria, using the distal internal carotid diameter as the denominator. CONTRAST:  82mL OMNIPAQUE IOHEXOL 350 MG/ML SOLN COMPARISON:  CT head without contrast 01/01/2019 FINDINGS: CTA NECK FINDINGS Aortic arch: 3 vessel arch  configuration is present. No significant atherosclerotic calcification are aneurysm is present. No stenosis is present. Right carotid system: Right common carotid artery is within. Bifurcation is within normal limits. Cervical right ICA is normal. Left carotid system: The left common carotid artery is within normal limits. Bifurcation is unremarkable. Cervical left ICA is normal. Vertebral arteries: The left vertebral artery is the dominant vessel. Both vertebral arteries originate from the subclavian arteries. No significant stenosis is present. Skeleton: Atherosclerotic calcifications are present that vertebral body heights and alignment are normal. No focal lytic or blastic lesions are present. Other neck: Soft tissues the neck unremarkable. Upper chest: The lung apices are clear. Thoracic inlet is normal. Review of the MIP images confirms the above findings CTA HEAD FINDINGS Anterior circulation: The internal carotid artery within normal limits bilaterally. ICA termini are normal. The A1 and M1 segments are normal. MCA bifurcations are within normal limits. The A1 and M1 segments are normal. The ACA and MCA branch vessels are within normal limits. No aneurysm is present. Posterior circulation: The left vertebral artery is the dominant vessel. Vertebrobasilar junction is normal. AICA vessels are dominant. The right posterior cerebral artery originates from the basilar tip. The left posterior cerebral artery is of fetal type. PCA branch vessels are normal. No aneurysm is present. Venous sinuses: Dural sinuses are patent. The straight sinus and deep cerebral veins are intact. Cortical veins are unremarkable. Anatomic variants: Fetal type left posterior cerebral artery. Review of the MIP images confirms the above findings IMPRESSION: 1. Negative CTA of the neck. 2. Normal variant CTA Circle of Willis without significant proximal stenosis, aneurysm, or branch vessel occlusion. Electronically Signed   By: San Morelle M.D.   On: 01/01/2020 19:59   DG Lumbar Puncture Fluoro Guide  Result Date: 01/01/2020 CLINICAL DATA:  Headache, unsuccessful lumbar puncture in the emergency room department. EXAM: DIAGNOSTIC LUMBAR PUNCTURE UNDER FLUOROSCOPIC GUIDANCE FLUOROSCOPY TIME:  Fluoroscopy Time:  0.9 minutes Number of Acquired Spot Images: 2 PROCEDURE: Informed consent was obtained from the patient prior to the procedure, including potential complications of headache, allergy, and pain. With the patient prone, the lower back was prepped with Betadine. 1% Lidocaine was used for local anesthesia. Lumbar puncture was performed at the L4-5 level level using a 16 gauge needle with return of reddish CSF. Twelve ml of CSF were obtained for laboratory studies. The patient tolerated the procedure well and there were no apparent complications. IMPRESSION: Successful fluoroscopic guided lumbar puncture with 12 cc of reddish CSF (suggesting bloody tap) obtained and sent to the lab. Electronically Signed  By: Franki Cabot M.D.   On: 01/01/2020 17:12   CSF:    Assessment: Abrupt onset of 10/10 thunderclap headache on a baseline of a milder chronic headache that has been ongoing since Friday night.  1. No focal neurological deficit on exam.  2. CSF reveals no xanthochromia but with markedly elevated RBCs as well as elevated WBC and protein. However, correction of WBC and protein for the RBCs results in normal values for the former. The LP results are compatible with either an unusually high RBC content of a bloody tap, versus acute subarachnoid hemorrhage.  3. CT head showed no hemorrhage. A small amount of blood could be missed by CT.  4. CTA of head and neck revealed no aneurysm or dissection. This raises the possibility of NASAH (non-aneurysmal subarachnoid hemorrhage) versus a bleeding lesion in the spinal canal.  5. MRI brain is normal per official read, but on review of images by Neurology, there is hyperintensity in the  prepontine and perimesencephalic cisterns that could represent hemorrhage versus artifact.   6. MRI of cervical, thoracic and lumbar spine reveals no acute abnormality at any level. An asymmetric disc bulge at L4-5 is noted. .  Recommendations: 1. Close monitoring.  2. Headache management. Migraine is now felt to be more likely given negative MRI scans. Medications that can be used include 25 mg IV phenergan x 1, IV magnesium sulfate 1000 mg x 1, Benadryl, steroid injection and IV fluid bolus.  3. The spinal tap results are now felt to more likely be due to a traumatic tap rather than subarachnoid hemorrhage. If no improvement of her headache by Monday morning, would rescan with CT and then perform second LP to assess for possible xanthochromia.     Electronically signed: Dr. Kerney Elbe 01/02/2020, 1:40 AM

## 2020-01-02 NOTE — Progress Notes (Signed)
Received pt from ED, alert and oriented, oriented to the room, call light in reach, implemented MD orders, continue to monitor, CN assisted with admissions, c/o severe pain and PRN was administered

## 2020-01-02 NOTE — Progress Notes (Addendum)
NEURO HOSPITALIST PROGRESS NOTE   Subjective: Patient awake, alert, NAD. Says that her HA is improving. Now most of her pain is in her neck. 7/10 pain for Head and 10/10 pain for neck. Family at bedside. Patient does speak english well and refused interpreter.  Exam: Vitals:   01/02/20 0045 01/02/20 0317  BP:  119/72  Pulse: (!) 121 (!) 102  Resp: (!) 23 17  Temp:  98.7 F (37.1 C)  SpO2: 100% 100%    Physical Exam  Constitutional: Appears well-developed and well-nourished.  Psych: Affect appropriate to situation Eyes: Normal external eye and conjunctiva. HENT: Normocephalic, no lesions, without obvious abnormality. Stiffness of neck flexion/extension.    Musculoskeletal-no joint tenderness, deformity or swelling Cardiovascular: Normal rate and regular rhythm.  Respiratory: Effort normal, non-labored breathing saturations WNL on RA GI: Soft.  No distension. There is no tenderness.  Skin: WDI   Neuro:  Mental Status: Alert, oriented, thought content appropriate.  Speech fluent without evidence of aphasia.  Able to follow commands without difficulty. Cranial Nerves: II:  Visual fields grossly normal,  III,IV, VI: ptosis not present, extra-ocular motions intact bilaterally pupils equal, round, reactive to light and accommodation V,VII: smile symmetric, facial light touch sensation normal bilaterally VIII: hearing normal bilaterally IX,X: uvula rises symmetrically XI: bilateral shoulder shrug XII: midline tongue extension Motor: Right : Upper extremity   5/5  Left:     Upper extremity   5/5  Lower extremity   5/5   Lower extremity   5/5 Tone and bulk:normal tone throughout; no atrophy noted Sensory: Pinprick and light touch intact throughout, bilaterally Deep Tendon Reflexes: 2+ and symmetric biceps, patella Plantars: Right: downgoing   Left: downgoing Cerebellar: No ataxia noted Gait: deferred    Medications:  Scheduled: . loratadine  10 mg  Oral Daily  . sodium chloride flush  3 mL Intravenous Once  . sodium chloride flush  3 mL Intravenous Q12H   Continuous: . lactated ringers 50 mL/hr at 01/02/20 0400   [id number], diphenhydrAMINE, HYDROmorphone (DILAUDID) injection, meclizine, naproxen, oxyCODONE, polyethylene glycol, promethazine  Pertinent Labs/Diagnostics:   CT Angio Head W or Wo Contrast  Result Date: 01/01/2020 CLINICAL DATA:  Severe headache. Normal head CT. Bloody lumbar puncture. EXAM: CT ANGIOGRAPHY HEAD AND NECK TECHNIQUE: Multidetector CT imaging of the head and neck was performed using the standard protocol during bolus administration of intravenous contrast. Multiplanar CT image reconstructions and MIPs were obtained to evaluate the vascular anatomy. Carotid stenosis measurements (when applicable) are obtained utilizing NASCET criteria, using the distal internal carotid diameter as the denominator. CONTRAST:  37mL OMNIPAQUE IOHEXOL 350 MG/ML SOLN COMPARISON:  CT head without contrast 01/01/2019 FINDINGS: CTA NECK FINDINGS Aortic arch: 3 vessel arch configuration is present. No significant atherosclerotic calcification are aneurysm is present. No stenosis is present. Right carotid system: Right common carotid artery is within. Bifurcation is within normal limits. Cervical right ICA is normal. Left carotid system: The left common carotid artery is within normal limits. Bifurcation is unremarkable. Cervical left ICA is normal. Vertebral arteries: The left vertebral artery is the dominant vessel. Both vertebral arteries originate from the subclavian arteries. No significant stenosis is present. Skeleton: Atherosclerotic calcifications are present that vertebral body heights and alignment are normal. No focal lytic or blastic lesions are present. Other neck: Soft tissues the neck unremarkable. Upper chest: The lung apices are clear. Thoracic inlet is  normal. Review of the MIP images confirms the above findings CTA HEAD  FINDINGS Anterior circulation: The internal carotid artery within normal limits bilaterally. ICA termini are normal. The A1 and M1 segments are normal. MCA bifurcations are within normal limits. The A1 and M1 segments are normal. The ACA and MCA branch vessels are within normal limits. No aneurysm is present. Posterior circulation: The left vertebral artery is the dominant vessel. Vertebrobasilar junction is normal. AICA vessels are dominant. The right posterior cerebral artery originates from the basilar tip. The left posterior cerebral artery is of fetal type. PCA branch vessels are normal. No aneurysm is present. Venous sinuses: Dural sinuses are patent. The straight sinus and deep cerebral veins are intact. Cortical veins are unremarkable. Anatomic variants: Fetal type left posterior cerebral artery. Review of the MIP images confirms the above findings IMPRESSION: 1. Negative CTA of the neck. 2. Normal variant CTA Circle of Willis without significant proximal stenosis, aneurysm, or branch vessel occlusion. Electronically Signed   By: San Morelle M.D.   On: 01/01/2020 19:59   CT HEAD WO CONTRAST  Result Date: 01/01/2020 CLINICAL DATA:  Headache. EXAM: CT HEAD WITHOUT CONTRAST TECHNIQUE: Contiguous axial images were obtained from the base of the skull through the vertex without intravenous contrast. COMPARISON:  None. FINDINGS: Brain: No evidence of acute infarction, hemorrhage, hydrocephalus, extra-axial collection or mass lesion/mass effect. Vascular: No hyperdense vessel or unexpected calcification. Skull: Normal. Negative for fracture or focal lesion. Sinuses/Orbits: Mild bilateral ethmoid and maxillary sinusitis. Other: None. IMPRESSION: Mild bilateral ethmoid and maxillary sinusitis. No acute intracranial abnormality seen. Electronically Signed   By: Marijo Conception M.D.   On: 01/01/2020 11:43   CT Angio Neck W and/or Wo Contrast  Result Date: 01/01/2020 CLINICAL DATA:  Severe headache.  Normal head CT. Bloody lumbar puncture. EXAM: CT ANGIOGRAPHY HEAD AND NECK TECHNIQUE: Multidetector CT imaging of the head and neck was performed using the standard protocol during bolus administration of intravenous contrast. Multiplanar CT image reconstructions and MIPs were obtained to evaluate the vascular anatomy. Carotid stenosis measurements (when applicable) are obtained utilizing NASCET criteria, using the distal internal carotid diameter as the denominator. CONTRAST:  61mL OMNIPAQUE IOHEXOL 350 MG/ML SOLN COMPARISON:  CT head without contrast 01/01/2019 FINDINGS: CTA NECK FINDINGS Aortic arch: 3 vessel arch configuration is present. No significant atherosclerotic calcification are aneurysm is present. No stenosis is present. Right carotid system: Right common carotid artery is within. Bifurcation is within normal limits. Cervical right ICA is normal. Left carotid system: The left common carotid artery is within normal limits. Bifurcation is unremarkable. Cervical left ICA is normal. Vertebral arteries: The left vertebral artery is the dominant vessel. Both vertebral arteries originate from the subclavian arteries. No significant stenosis is present. Skeleton: Atherosclerotic calcifications are present that vertebral body heights and alignment are normal. No focal lytic or blastic lesions are present. Other neck: Soft tissues the neck unremarkable. Upper chest: The lung apices are clear. Thoracic inlet is normal. Review of the MIP images confirms the above findings CTA HEAD FINDINGS Anterior circulation: The internal carotid artery within normal limits bilaterally. ICA termini are normal. The A1 and M1 segments are normal. MCA bifurcations are within normal limits. The A1 and M1 segments are normal. The ACA and MCA branch vessels are within normal limits. No aneurysm is present. Posterior circulation: The left vertebral artery is the dominant vessel. Vertebrobasilar junction is normal. AICA vessels are  dominant. The right posterior cerebral artery originates from the basilar tip.  The left posterior cerebral artery is of fetal type. PCA branch vessels are normal. No aneurysm is present. Venous sinuses: Dural sinuses are patent. The straight sinus and deep cerebral veins are intact. Cortical veins are unremarkable. Anatomic variants: Fetal type left posterior cerebral artery. Review of the MIP images confirms the above findings IMPRESSION: 1. Negative CTA of the neck. 2. Normal variant CTA Circle of Willis without significant proximal stenosis, aneurysm, or branch vessel occlusion. Electronically Signed   By: San Morelle M.D.   On: 01/01/2020 19:59   MR BRAIN WO CONTRAST  Addendum Date: 01/02/2020   ADDENDUM REPORT: 01/02/2020 05:40 ADDENDUM: Case was discussed with Dr. Kerney Elbe at 5:30 a.m. on 01/02/2019. There is hyperintense signal on the axial FLAIR sequence within the interpeduncular cistern and right ambient cistern. This is favored to be secondary to a CSF pulsation artifact. There is no corresponding abnormality on any other sequence and this is a common location for failure of fluid signal suppression. Given the unusual clinical presentation, if there is continued clinical concern for subarachnoid hemorrhage, a 3D FLAIR acquisition should be able to eliminate this artifact. Alternatively, repeating the noncontrast head CT should also be able to exclude perimesencephalic hemorrhage. Electronically Signed   By: Ulyses Jarred M.D.   On: 01/02/2020 05:40   Result Date: 01/02/2020 CLINICAL DATA:  Thunderclap headache EXAM: MRI HEAD WITHOUT CONTRAST TECHNIQUE: Multiplanar, multiecho pulse sequences of the brain and surrounding structures were obtained without intravenous contrast. COMPARISON:  None. FINDINGS: BRAIN: No acute infarct, acute hemorrhage or extra-axial collection. Normal white matter signal for age. Normal volume of brain parenchyma and CSF spaces. Midline structures are normal.  VASCULAR: Major flow voids are preserved. Susceptibility-sensitive sequences show no chronic microhemorrhage or superficial siderosis. SKULL AND UPPER CERVICAL SPINE: Normal calvarium and skull base. Visualized upper cervical spine and soft tissues are normal. SINUSES/ORBITS: No paranasal sinus fluid levels or advanced mucosal thickening. No mastoid or middle ear effusion. Normal orbits. IMPRESSION: Normal brain MRI. Electronically Signed: By: Ulyses Jarred M.D. On: 01/02/2020 02:55   MR CERVICAL SPINE WO CONTRAST  Result Date: 01/02/2020 CLINICAL DATA:  Severe headache EXAM: MRI CERVICAL, THORACIC AND LUMBAR SPINE WITHOUT CONTRAST TECHNIQUE: Multiplanar and multiecho pulse sequences of the cervical spine, to include the craniocervical junction and cervicothoracic junction, and thoracic and lumbar spine, were obtained without intravenous contrast. COMPARISON:  None. FINDINGS: MRI CERVICAL SPINE FINDINGS Alignment: Physiologic. Vertebrae: No fracture, evidence of discitis, or bone lesion. Cord: Normal signal and morphology. Posterior Fossa, vertebral arteries, paraspinal tissues: Negative. Disc levels: No disc herniation or stenosis MRI THORACIC SPINE FINDINGS Alignment:  Physiologic. Vertebrae: No fracture, evidence of discitis, or bone lesion. Cord:  Normal signal and morphology. Paraspinal and other soft tissues: Negative. Disc levels: No disc herniation or stenosis MRI LUMBAR SPINE FINDINGS Segmentation:  Standard. Alignment:  Physiologic. Vertebrae:  No fracture, evidence of discitis, or bone lesion. Conus medullaris and cauda equina: Conus extends to the L1 level. Conus and cauda equina appear normal. Paraspinal and other soft tissues: Negative Disc levels: Mild left asymmetric disc bulge at L4-5 with narrowing of the left lateral recess. The other disc levels are normal. IMPRESSION: 1. No acute abnormality. 2. Normal cervical and thoracic spine. 3. L4-5 left asymmetric disc bulge with narrowing of the left  lateral recess. Correlate for L4 radiculopathy. Electronically Signed   By: Ulyses Jarred M.D.   On: 01/02/2020 03:15   MR THORACIC SPINE WO CONTRAST  Result Date: 01/02/2020 CLINICAL DATA:  Severe  headache EXAM: MRI CERVICAL, THORACIC AND LUMBAR SPINE WITHOUT CONTRAST TECHNIQUE: Multiplanar and multiecho pulse sequences of the cervical spine, to include the craniocervical junction and cervicothoracic junction, and thoracic and lumbar spine, were obtained without intravenous contrast. COMPARISON:  None. FINDINGS: MRI CERVICAL SPINE FINDINGS Alignment: Physiologic. Vertebrae: No fracture, evidence of discitis, or bone lesion. Cord: Normal signal and morphology. Posterior Fossa, vertebral arteries, paraspinal tissues: Negative. Disc levels: No disc herniation or stenosis MRI THORACIC SPINE FINDINGS Alignment:  Physiologic. Vertebrae: No fracture, evidence of discitis, or bone lesion. Cord:  Normal signal and morphology. Paraspinal and other soft tissues: Negative. Disc levels: No disc herniation or stenosis MRI LUMBAR SPINE FINDINGS Segmentation:  Standard. Alignment:  Physiologic. Vertebrae:  No fracture, evidence of discitis, or bone lesion. Conus medullaris and cauda equina: Conus extends to the L1 level. Conus and cauda equina appear normal. Paraspinal and other soft tissues: Negative Disc levels: Mild left asymmetric disc bulge at L4-5 with narrowing of the left lateral recess. The other disc levels are normal. IMPRESSION: 1. No acute abnormality. 2. Normal cervical and thoracic spine. 3. L4-5 left asymmetric disc bulge with narrowing of the left lateral recess. Correlate for L4 radiculopathy. Electronically Signed   By: Ulyses Jarred M.D.   On: 01/02/2020 03:15   MR LUMBAR SPINE WO CONTRAST  Result Date: 01/02/2020 CLINICAL DATA:  Severe headache EXAM: MRI CERVICAL, THORACIC AND LUMBAR SPINE WITHOUT CONTRAST TECHNIQUE: Multiplanar and multiecho pulse sequences of the cervical spine, to include the  craniocervical junction and cervicothoracic junction, and thoracic and lumbar spine, were obtained without intravenous contrast. COMPARISON:  None. FINDINGS: MRI CERVICAL SPINE FINDINGS Alignment: Physiologic. Vertebrae: No fracture, evidence of discitis, or bone lesion. Cord: Normal signal and morphology. Posterior Fossa, vertebral arteries, paraspinal tissues: Negative. Disc levels: No disc herniation or stenosis MRI THORACIC SPINE FINDINGS Alignment:  Physiologic. Vertebrae: No fracture, evidence of discitis, or bone lesion. Cord:  Normal signal and morphology. Paraspinal and other soft tissues: Negative. Disc levels: No disc herniation or stenosis MRI LUMBAR SPINE FINDINGS Segmentation:  Standard. Alignment:  Physiologic. Vertebrae:  No fracture, evidence of discitis, or bone lesion. Conus medullaris and cauda equina: Conus extends to the L1 level. Conus and cauda equina appear normal. Paraspinal and other soft tissues: Negative Disc levels: Mild left asymmetric disc bulge at L4-5 with narrowing of the left lateral recess. The other disc levels are normal. IMPRESSION: 1. No acute abnormality. 2. Normal cervical and thoracic spine. 3. L4-5 left asymmetric disc bulge with narrowing of the left lateral recess. Correlate for L4 radiculopathy. Electronically Signed   By: Ulyses Jarred M.D.   On: 01/02/2020 03:15   DG Lumbar Puncture Fluoro Guide  Result Date: 01/01/2020 CLINICAL DATA:  Headache, unsuccessful lumbar puncture in the emergency room department. EXAM: DIAGNOSTIC LUMBAR PUNCTURE UNDER FLUOROSCOPIC GUIDANCE FLUOROSCOPY TIME:  Fluoroscopy Time:  0.9 minutes Number of Acquired Spot Images: 2 PROCEDURE: Informed consent was obtained from the patient prior to the procedure, including potential complications of headache, allergy, and pain. With the patient prone, the lower back was prepped with Betadine. 1% Lidocaine was used for local anesthesia. Lumbar puncture was performed at the L4-5 level level using a  16 gauge needle with return of reddish CSF. Twelve ml of CSF were obtained for laboratory studies. The patient tolerated the procedure well and there were no apparent complications. IMPRESSION: Successful fluoroscopic guided lumbar puncture with 12 cc of reddish CSF (suggesting bloody tap) obtained and sent to the lab. Electronically Signed  By: Franki Cabot M.D.   On: 01/01/2020 17:12   Assessment: Abrupt onset of 10/10 thunderclap headache on a baseline of a milder chronic headache that has been ongoing since Friday night. CTA did not show an aneurysm. MRI brain is normal per official read, but on review of images by Neurology, there is hyperintensity in the prepontine and perimesencephalic cisterns that could represent hemorrhage versus artifact MRI c/t/ lumbar spine did not reveal anything acute. An asymmetric disc bulge at L4-L5   Recommendations: -- Close monitoring.  -- Headache management. Migraine is now felt to be more likely given negative MRI scans. will start on compazine 10 mg IV every 6 hours, Toradol 30 mg IV every 12 hours, Benadryl 25 mg IV every 12 hours. One time dose of 1g depakote  --The spinal tap results are now felt to more likely be due to a traumatic tap rather than subarachnoid hemorrhage. If no improvement of her headache by Monday morning, would rescan with CT and then perform second LP to assess for possible xanthochromia.   Laurey Morale, MSN, NP-C [facility] [phone]      01/02/2020, 9:10 AM

## 2020-01-02 NOTE — ED Notes (Signed)
Pt transported to MRI 

## 2020-01-03 ENCOUNTER — Inpatient Hospital Stay (HOSPITAL_COMMUNITY): Payer: BLUE CROSS/BLUE SHIELD

## 2020-01-03 ENCOUNTER — Encounter (HOSPITAL_COMMUNITY): Payer: Self-pay | Admitting: Internal Medicine

## 2020-01-03 DIAGNOSIS — I609 Nontraumatic subarachnoid hemorrhage, unspecified: Principal | ICD-10-CM

## 2020-01-03 LAB — BASIC METABOLIC PANEL
Anion gap: 10 (ref 5–15)
BUN: 7 mg/dL (ref 6–20)
CO2: 22 mmol/L (ref 22–32)
Calcium: 8.7 mg/dL — ABNORMAL LOW (ref 8.9–10.3)
Chloride: 107 mmol/L (ref 98–111)
Creatinine, Ser: 0.57 mg/dL (ref 0.44–1.00)
GFR calc Af Amer: >60 mL/min (ref >60)
GFR calc non Af Amer: >60 mL/min (ref >60)
Glucose, Bld: 127 mg/dL — ABNORMAL HIGH (ref 70–99)
Potassium: 3.3 mmol/L — ABNORMAL LOW (ref 3.5–5.1)
Sodium: 139 mmol/L (ref 135–145)

## 2020-01-03 LAB — MAGNESIUM: Magnesium: 1.9 mg/dL (ref 1.7–2.4)

## 2020-01-03 LAB — PATHOLOGIST SMEAR REVIEW

## 2020-01-03 LAB — C-REACTIVE PROTEIN: CRP: 0.6 mg/dL (ref <1.0)

## 2020-01-03 MED ORDER — BACLOFEN 10 MG PO TABS
10.0000 mg | ORAL_TABLET | Freq: Three times a day (TID) | ORAL | Status: DC
  Administered 2020-01-03 – 2020-01-05 (×8): 10 mg via ORAL
  Filled 2020-01-03 (×8): qty 1

## 2020-01-03 MED ORDER — POTASSIUM CHLORIDE CRYS ER 20 MEQ PO TBCR
40.0000 meq | EXTENDED_RELEASE_TABLET | Freq: Once | ORAL | Status: AC
  Administered 2020-01-03: 40 meq via ORAL
  Filled 2020-01-03: qty 2

## 2020-01-03 NOTE — Progress Notes (Signed)
Report from tele that patient HR in 140s. Pt is walking to bathroom. HR not sustaining, now 110s. Pt is asymptomatic. Dr. Reesa Chew notified. [place]

## 2020-01-03 NOTE — Progress Notes (Signed)
PROGRESS NOTE    Sylvia Best  RCV:[id number] DOB: [birth year] DOA: 01/01/2020 PCP: Lanae Boast, FNP   Brief Narrative:  39 year old with history of DM2, asthma, GERD presents to the hospital complaining of pretty severe frontal headache with neck stiffness.  CT of the head negative, LP was overall unremarkable.  MRI of the brain was normal.  Seen by neurology team started on typical headache cocktail.  Repeat MRI showed subarachnoid hemorrhage.  Neurology team is following, will discuss case with neuro interventional radiology.   Assessment & Plan:   Principal Problem:   Sudden onset of severe headache Active Problems:   History of diabetes mellitus   Tachycardia   Headache  Sudden severe headache mostly left-sided with neck stiffness and intermittent numbness of the fingers Subarachnoid hemorrhage -CT head, LP and MRI brain-negative for acute pathology.  MRI performed on 4/19 showed subarachnoid hemorrhage.  No obvious AV malformation/venous sinus abnormality seen on the CTA and MRI scans -Case will be discussed by neuro hospitalist with neuro interventional radiology regarding necessity of cerebral angiogram -Neurology team following -ESR-CRP-normal -Continue Phenergan, magnesium, Benadryl, steroids and fluids -Baclofen as needed  Diet controlled diabetes mellitus type 2 likely supportive care   DVT prophylaxis: SCDs Code Status: Full code Family Communication: Discussed with husband and patient's friend who is a neurologist over the phone. Disposition Plan:   Patient From= home  Patient Anticipated D/C place= home  Barriers= ongoing evaluation for severe headache secondary to subarachnoid hemorrhage of unknown etiology.  Maintain hospital stay.   Subjective: Still having severe neck pain with generalized headache.  Does have mild photophobia  Review of Systems Otherwise negative except as per HPI, including: General: Denies fever, chills, night sweats or unintended  weight loss. Resp: Denies cough, wheezing, shortness of breath. Cardiac: Denies chest pain, palpitations, orthopnea, paroxysmal nocturnal dyspnea. GI: Denies abdominal pain, nausea, vomiting, diarrhea or constipation GU: Denies dysuria, frequency, hesitancy or incontinence MS: Denies muscle aches, joint pain or swelling Neuro: Denies abnormal gait Psych: Denies anxiety, depression, SI/HI/AVH Skin: Denies new rashes or lesions ID: Denies sick contacts, exotic exposures, travel  Examination:  Constitutional: Headaches are causing mild distress Respiratory: Clear to auscultation bilaterally Cardiovascular: Normal sinus rhythm, no rubs Abdomen: Nontender nondistended good bowel sounds Musculoskeletal: No edema noted Skin: No rashes seen Neurologic: CN 2-12 grossly intact.  And nonfocal Psychiatric: Normal judgment and insight. Alert and oriented x 3. Normal mood.  Objective: Vitals:   01/02/20 2019 01/03/20 0403 01/03/20 0743 01/03/20 1130  BP: 91/61 110/70 109/80 135/81  Pulse: 79 75 85 96  Resp: _0 Temp: 99.9 F (37.7 C) 98.7 F (37.1 C) 98.9 F (37.2 C) 98.7 F (37.1 C)  TempSrc: Oral Oral Oral Oral  SpO2: 99% 99% 100% 100%  Weight:      Height:        Intake/Output Summary (Last 24 hours) at 01/03/2020 1256 Last data filed at 01/03/2020 1131 Gross per 24 hour  Intake 360 ml  Output --  Net 360 ml   Filed Weights   01/02/20 0413  Weight: 63.4 kg     Data Reviewed:   CBC: Recent Labs  Lab 01/01/20 1106 01/02/20 0449  WBC 4.9 12.0*  NEUTROABS 2.0  --   HGB 13.5 13.1  HCT 39.9 38.8  MCV 93.7 93.7  PLT 255 102   Basic Metabolic Panel: Recent Labs  Lab 01/01/20 1106 01/02/20 0449 01/03/20 0427  NA 136 136 139  K 3.8 3.6 3.3*  CL 107 108 107  CO2 20* 15* 22  GLUCOSE 157* 194* 127*  BUN _0 CREATININE 0.62 0.67 0.57  CALCIUM 8.9 9.0 8.7*  MG  --   --  1.9   GFR: Estimated Creatinine Clearance: 81.5 mL/min (by C-G formula based on  SCr of 0.57 mg/dL). Liver Function Tests: Recent Labs  Lab 01/01/20 1106 01/02/20 0449  AST 24 20  ALT 24 22  ALKPHOS 36* 34*  BILITOT 0.5 0.4  PROT 7.1 6.6  ALBUMIN 3.7 3.4*   No results for input(s): LIPASE, AMYLASE in the last 168 hours. No results for input(s): AMMONIA in the last 168 hours. Coagulation Profile: Recent Labs  Lab 01/01/20 1106  INR 1.1   Cardiac Enzymes: No results for input(s): CKTOTAL, CKMB, CKMBINDEX, TROPONINI in the last 168 hours. BNP (last 3 results) No results for input(s): PROBNP in the last 8760 hours. HbA1C: No results for input(s): HGBA1C in the last 72 hours. CBG: Recent Labs  Lab 01/02/20 0617  GLUCAP 147*   Lipid Profile: No results for input(s): CHOL, HDL, LDLCALC, TRIG, CHOLHDL, LDLDIRECT in the last 72 hours. Thyroid Function Tests: No results for input(s): TSH, T4TOTAL, FREET4, T3FREE, THYROIDAB in the last 72 hours. Anemia Panel: No results for input(s): VITAMINB12, FOLATE, FERRITIN, TIBC, IRON, RETICCTPCT in the last 72 hours. Sepsis Labs: No results for input(s): PROCALCITON, LATICACIDVEN in the last 168 hours.  Recent Results (from the past 240 hour(s))  CSF culture     Status: None (Preliminary result)   Collection Time: 01/01/20  4:58 PM   Specimen: PATH Cytology CSF; Cerebrospinal Fluid  Result Value Ref Range Status   Specimen Description CSF  Final   Special Requests NONE  Final   Gram Stain   Final    CYTOSPIN SMEAR WBC PRESENT, PREDOMINANTLY PMN NO WBC SEEN    Culture   Final    NO GROWTH 2 DAYS Performed at [facility], [address] [place], [place] [address]    Report Status PENDING  Incomplete  SARS CORONAVIRUS 2 (TAT 6-24 HRS) Nasopharyngeal Nasopharyngeal Swab     Status: None   Collection Time: 01/02/20 12:07 AM   Specimen: Nasopharyngeal Swab  Result Value Ref Range Status   SARS Coronavirus 2 NEGATIVE NEGATIVE Final    Comment: (NOTE) SARS-CoV-2 target nucleic acids are NOT  DETECTED. The SARS-CoV-2 RNA is generally detectable in upper and lower respiratory specimens during the acute phase of infection. Negative results do not preclude SARS-CoV-2 infection, do not rule out co-infections with other pathogens, and should not be used as the sole basis for treatment or other patient management decisions. Negative results must be combined with clinical observations, patient history, and epidemiological information. The expected result is Negative. Fact Sheet for Patients: SugarRoll.be Fact Sheet for Healthcare Providers: https://www.woods-mathews.com/ This test is not yet approved or cleared by the [place] FDA and  has been authorized for detection and/or diagnosis of SARS-CoV-2 by FDA under an Emergency Use Authorization (EUA). This EUA will remain  in effect (meaning this test can be used) for the duration of the COVID-19 declaration under Section 56 4(b)(1) of the Act, 21 U.S.C. section 360bbb-3(b)(1), unless the authorization is terminated or revoked sooner. Performed at [facility], [place] [address], [place], [place] [address]          Radiology Studies: CT Angio Head W or Wo Contrast  Result Date: 01/01/2020 CLINICAL DATA:  Severe headache. Normal head CT. Bloody  lumbar puncture. EXAM: CT ANGIOGRAPHY HEAD AND NECK TECHNIQUE: Multidetector CT imaging of the head and neck was performed using the standard protocol during bolus administration of intravenous contrast. Multiplanar CT image reconstructions and MIPs were obtained to evaluate the vascular anatomy. Carotid stenosis measurements (when applicable) are obtained utilizing NASCET criteria, using the distal internal carotid diameter as the denominator. CONTRAST:  50m OMNIPAQUE IOHEXOL 350 MG/ML SOLN COMPARISON:  CT head without contrast 01/01/2019 FINDINGS: CTA NECK FINDINGS Aortic arch: 3 vessel arch configuration is present. No significant  atherosclerotic calcification are aneurysm is present. No stenosis is present. Right carotid system: Right common carotid artery is within. Bifurcation is within normal limits. Cervical right ICA is normal. Left carotid system: The left common carotid artery is within normal limits. Bifurcation is unremarkable. Cervical left ICA is normal. Vertebral arteries: The left vertebral artery is the dominant vessel. Both vertebral arteries originate from the subclavian arteries. No significant stenosis is present. Skeleton: Atherosclerotic calcifications are present that vertebral body heights and alignment are normal. No focal lytic or blastic lesions are present. Other neck: Soft tissues the neck unremarkable. Upper chest: The lung apices are clear. Thoracic inlet is normal. Review of the MIP images confirms the above findings CTA HEAD FINDINGS Anterior circulation: The internal carotid artery within normal limits bilaterally. ICA termini are normal. The A1 and M1 segments are normal. MCA bifurcations are within normal limits. The A1 and M1 segments are normal. The ACA and MCA branch vessels are within normal limits. No aneurysm is present. Posterior circulation: The left vertebral artery is the dominant vessel. Vertebrobasilar junction is normal. AICA vessels are dominant. The right posterior cerebral artery originates from the basilar tip. The left posterior cerebral artery is of fetal type. PCA branch vessels are normal. No aneurysm is present. Venous sinuses: Dural sinuses are patent. The straight sinus and deep cerebral veins are intact. Cortical veins are unremarkable. Anatomic variants: Fetal type left posterior cerebral artery. Review of the MIP images confirms the above findings IMPRESSION: 1. Negative CTA of the neck. 2. Normal variant CTA Circle of Willis without significant proximal stenosis, aneurysm, or branch vessel occlusion. Electronically Signed   By: CSan MorelleM.D.   On: 01/01/2020 19:59    CT HEAD WO CONTRAST  Addendum Date: 01/03/2020   ADDENDUM REPORT: 01/03/2020 11:25 ADDENDUM: This case was discussed by telephone with Dr. AAmie Portlandon 01/03/2020 at 10:54, and furthermore the repeat brain MRI today seems to demonstrate a trace left superior convexity subdural hematoma which was not present by MRI yesterday (please see that report). Case also was re-discussed with Dr. AMalen Gauzeat at 11:24 am. Electronically Signed   By: HGenevie AnnM.D.   On: 01/03/2020 11:25   Result Date: 01/03/2020 CLINICAL DATA:  39year old female with abrupt onset of thunderclap headache 12/31/2018. Appearance suspicious for basilar cistern subarachnoid hemorrhage versus artifact on brain MRI yesterday. EXAM: CT HEAD WITHOUT CONTRAST TECHNIQUE: Contiguous axial images were obtained from the base of the skull through the vertex without intravenous contrast. COMPARISON:  Head CT and CTA head and neck 01/01/2020. Brain MRI yesterday. FINDINGS: Brain: New since 01/01/2020 small but suspicious volume of hyperdensity in the interpeduncular cistern (series 3, image 12) and prepontine cistern. See also sagittal image 32. No intraventricular hemorrhage or ventriculomegaly. No other intracranial blood identified. No midline shift, mass effect, or evidence of intracranial mass lesion. Gray-white matter differentiation remains normal. No cortically based acute infarct identified. Vascular: No suspicious intracranial vascular hyperdensity. Skull: Stable and  negative. Sinuses/Orbits: There are scattered small sinus fluid levels and bubbly opacity, not significantly changed from 01/01/2020. Tympanic cavities and mastoids remain clear. Other: Visualized orbits and scalp soft tissues are within normal limits. IMPRESSION: Noncontrast CT appearance now suspicious for Trace Subarachnoid Hemorrhage in the basilar cisterns. No intracranial mass effect. No intraventricular hemorrhage. Electronically Signed: By: Genevie Ann M.D. On: 01/03/2020 10:48    CT Angio Neck W and/or Wo Contrast  Result Date: 01/01/2020 CLINICAL DATA:  Severe headache. Normal head CT. Bloody lumbar puncture. EXAM: CT ANGIOGRAPHY HEAD AND NECK TECHNIQUE: Multidetector CT imaging of the head and neck was performed using the standard protocol during bolus administration of intravenous contrast. Multiplanar CT image reconstructions and MIPs were obtained to evaluate the vascular anatomy. Carotid stenosis measurements (when applicable) are obtained utilizing NASCET criteria, using the distal internal carotid diameter as the denominator. CONTRAST:  43m OMNIPAQUE IOHEXOL 350 MG/ML SOLN COMPARISON:  CT head without contrast 01/01/2019 FINDINGS: CTA NECK FINDINGS Aortic arch: 3 vessel arch configuration is present. No significant atherosclerotic calcification are aneurysm is present. No stenosis is present. Right carotid system: Right common carotid artery is within. Bifurcation is within normal limits. Cervical right ICA is normal. Left carotid system: The left common carotid artery is within normal limits. Bifurcation is unremarkable. Cervical left ICA is normal. Vertebral arteries: The left vertebral artery is the dominant vessel. Both vertebral arteries originate from the subclavian arteries. No significant stenosis is present. Skeleton: Atherosclerotic calcifications are present that vertebral body heights and alignment are normal. No focal lytic or blastic lesions are present. Other neck: Soft tissues the neck unremarkable. Upper chest: The lung apices are clear. Thoracic inlet is normal. Review of the MIP images confirms the above findings CTA HEAD FINDINGS Anterior circulation: The internal carotid artery within normal limits bilaterally. ICA termini are normal. The A1 and M1 segments are normal. MCA bifurcations are within normal limits. The A1 and M1 segments are normal. The ACA and MCA branch vessels are within normal limits. No aneurysm is present. Posterior circulation: The left  vertebral artery is the dominant vessel. Vertebrobasilar junction is normal. AICA vessels are dominant. The right posterior cerebral artery originates from the basilar tip. The left posterior cerebral artery is of fetal type. PCA branch vessels are normal. No aneurysm is present. Venous sinuses: Dural sinuses are patent. The straight sinus and deep cerebral veins are intact. Cortical veins are unremarkable. Anatomic variants: Fetal type left posterior cerebral artery. Review of the MIP images confirms the above findings IMPRESSION: 1. Negative CTA of the neck. 2. Normal variant CTA Circle of Willis without significant proximal stenosis, aneurysm, or branch vessel occlusion. Electronically Signed   By: CSan MorelleM.D.   On: 01/01/2020 19:59   MR BRAIN WO CONTRAST  Result Date: 01/03/2020 CLINICAL DATA:  39year old female with abrupt onset of thunderclap headache 12/31/2018. Appearance suspicious for basilar cistern subarachnoid hemorrhage versus artifact on brain MRI yesterday. Abnormal follow-up noncontrast head CT just now. EXAM: MRI HEAD WITHOUT CONTRAST TECHNIQUE: Multiplanar, multiecho pulse sequences of the brain and surrounding structures were obtained without intravenous contrast. COMPARISON:  Head CT today reported separately. Brain MRI 01/02/2020. FINDINGS: Brain: Trace but suspicious FLAIR hyperintensity in the interpeduncular cistern, prepontine cisterns. Suspicious appearance in the right prepontine cistern on susceptibility weighted imaging, and T1 weighted imaging also appears abnormally heterogeneous in the interpeduncular cistern (series 5, image 12), as well as layering on the pituitary in the suprasellar cistern. Furthermore, there is evidence of trace subdural  hematoma over the left superior convexity on axial FLAIR, SWI, sagittal T1 and coronal T2 (series 9, image 11 and series 5, image 17). This is new from the MRI yesterday. No other extra-axial blood identified. No  intraventricular blood products identified. No restricted diffusion to suggest acute infarction. No midline shift, mass effect, evidence of mass lesion, ventriculomegaly. Cervicomedullary junction and pituitary are within normal limits. Parenchymal gray and white matter signal appears stable and within normal limits. No areas of cerebral edema identified. No definite chronic cerebral blood products. Vascular: Major intracranial vascular flow voids appear stable and within normal limits. Skull and upper cervical spine: Negative visible cervical spine. Visualized bone marrow signal is within normal limits. Sinuses/Orbits: Negative orbits. Paranasal sinus fluid levels again noted. Other: Mastoids remain clear. Visible internal auditory structures appear normal. Scalp and face soft tissues appear negative. IMPRESSION: 1. Corroborating evidence of trace subarachnoid hemorrhage, but furthermore there appears to be also a trace left superior convexity Subdural Hematoma (series 9, image 11) which is new from the MRI yesterday. 2. This pattern of acute blood is unusual and of unclear etiology. No evidence of acute infarct or cerebral edema, brain parenchyma appears stable and negative. 3. This case was discussed by telephone with Dr. Amie Portland on 01/03/2020 at 10:54. Electronically Signed   By: Genevie Ann M.D.   On: 01/03/2020 11:06   MR BRAIN WO CONTRAST  Addendum Date: 01/02/2020   ADDENDUM REPORT: 01/02/2020 05:40 ADDENDUM: Case was discussed with Dr. Kerney Elbe at 5:30 a.m. on 01/02/2019. There is hyperintense signal on the axial FLAIR sequence within the interpeduncular cistern and right ambient cistern. This is favored to be secondary to a CSF pulsation artifact. There is no corresponding abnormality on any other sequence and this is a common location for failure of fluid signal suppression. Given the unusual clinical presentation, if there is continued clinical concern for subarachnoid hemorrhage, a 3D FLAIR  acquisition should be able to eliminate this artifact. Alternatively, repeating the noncontrast head CT should also be able to exclude perimesencephalic hemorrhage. Electronically Signed   By: Ulyses Jarred M.D.   On: 01/02/2020 05:40   Result Date: 01/02/2020 CLINICAL DATA:  Thunderclap headache EXAM: MRI HEAD WITHOUT CONTRAST TECHNIQUE: Multiplanar, multiecho pulse sequences of the brain and surrounding structures were obtained without intravenous contrast. COMPARISON:  None. FINDINGS: BRAIN: No acute infarct, acute hemorrhage or extra-axial collection. Normal white matter signal for age. Normal volume of brain parenchyma and CSF spaces. Midline structures are normal. VASCULAR: Major flow voids are preserved. Susceptibility-sensitive sequences show no chronic microhemorrhage or superficial siderosis. SKULL AND UPPER CERVICAL SPINE: Normal calvarium and skull base. Visualized upper cervical spine and soft tissues are normal. SINUSES/ORBITS: No paranasal sinus fluid levels or advanced mucosal thickening. No mastoid or middle ear effusion. Normal orbits. IMPRESSION: Normal brain MRI. Electronically Signed: By: Ulyses Jarred M.D. On: 01/02/2020 02:55   MR CERVICAL SPINE WO CONTRAST  Result Date: 01/02/2020 CLINICAL DATA:  Severe headache EXAM: MRI CERVICAL, THORACIC AND LUMBAR SPINE WITHOUT CONTRAST TECHNIQUE: Multiplanar and multiecho pulse sequences of the cervical spine, to include the craniocervical junction and cervicothoracic junction, and thoracic and lumbar spine, were obtained without intravenous contrast. COMPARISON:  None. FINDINGS: MRI CERVICAL SPINE FINDINGS Alignment: Physiologic. Vertebrae: No fracture, evidence of discitis, or bone lesion. Cord: Normal signal and morphology. Posterior Fossa, vertebral arteries, paraspinal tissues: Negative. Disc levels: No disc herniation or stenosis MRI THORACIC SPINE FINDINGS Alignment:  Physiologic. Vertebrae: No fracture, evidence of discitis, or bone  lesion.  Cord:  Normal signal and morphology. Paraspinal and other soft tissues: Negative. Disc levels: No disc herniation or stenosis MRI LUMBAR SPINE FINDINGS Segmentation:  Standard. Alignment:  Physiologic. Vertebrae:  No fracture, evidence of discitis, or bone lesion. Conus medullaris and cauda equina: Conus extends to the L1 level. Conus and cauda equina appear normal. Paraspinal and other soft tissues: Negative Disc levels: Mild left asymmetric disc bulge at L4-5 with narrowing of the left lateral recess. The other disc levels are normal. IMPRESSION: 1. No acute abnormality. 2. Normal cervical and thoracic spine. 3. L4-5 left asymmetric disc bulge with narrowing of the left lateral recess. Correlate for L4 radiculopathy. Electronically Signed   By: Ulyses Jarred M.D.   On: 01/02/2020 03:15   MR THORACIC SPINE WO CONTRAST  Result Date: 01/02/2020 CLINICAL DATA:  Severe headache EXAM: MRI CERVICAL, THORACIC AND LUMBAR SPINE WITHOUT CONTRAST TECHNIQUE: Multiplanar and multiecho pulse sequences of the cervical spine, to include the craniocervical junction and cervicothoracic junction, and thoracic and lumbar spine, were obtained without intravenous contrast. COMPARISON:  None. FINDINGS: MRI CERVICAL SPINE FINDINGS Alignment: Physiologic. Vertebrae: No fracture, evidence of discitis, or bone lesion. Cord: Normal signal and morphology. Posterior Fossa, vertebral arteries, paraspinal tissues: Negative. Disc levels: No disc herniation or stenosis MRI THORACIC SPINE FINDINGS Alignment:  Physiologic. Vertebrae: No fracture, evidence of discitis, or bone lesion. Cord:  Normal signal and morphology. Paraspinal and other soft tissues: Negative. Disc levels: No disc herniation or stenosis MRI LUMBAR SPINE FINDINGS Segmentation:  Standard. Alignment:  Physiologic. Vertebrae:  No fracture, evidence of discitis, or bone lesion. Conus medullaris and cauda equina: Conus extends to the L1 level. Conus and cauda equina appear normal.  Paraspinal and other soft tissues: Negative Disc levels: Mild left asymmetric disc bulge at L4-5 with narrowing of the left lateral recess. The other disc levels are normal. IMPRESSION: 1. No acute abnormality. 2. Normal cervical and thoracic spine. 3. L4-5 left asymmetric disc bulge with narrowing of the left lateral recess. Correlate for L4 radiculopathy. Electronically Signed   By: Ulyses Jarred M.D.   On: 01/02/2020 03:15   MR LUMBAR SPINE WO CONTRAST  Result Date: 01/02/2020 CLINICAL DATA:  Severe headache EXAM: MRI CERVICAL, THORACIC AND LUMBAR SPINE WITHOUT CONTRAST TECHNIQUE: Multiplanar and multiecho pulse sequences of the cervical spine, to include the craniocervical junction and cervicothoracic junction, and thoracic and lumbar spine, were obtained without intravenous contrast. COMPARISON:  None. FINDINGS: MRI CERVICAL SPINE FINDINGS Alignment: Physiologic. Vertebrae: No fracture, evidence of discitis, or bone lesion. Cord: Normal signal and morphology. Posterior Fossa, vertebral arteries, paraspinal tissues: Negative. Disc levels: No disc herniation or stenosis MRI THORACIC SPINE FINDINGS Alignment:  Physiologic. Vertebrae: No fracture, evidence of discitis, or bone lesion. Cord:  Normal signal and morphology. Paraspinal and other soft tissues: Negative. Disc levels: No disc herniation or stenosis MRI LUMBAR SPINE FINDINGS Segmentation:  Standard. Alignment:  Physiologic. Vertebrae:  No fracture, evidence of discitis, or bone lesion. Conus medullaris and cauda equina: Conus extends to the L1 level. Conus and cauda equina appear normal. Paraspinal and other soft tissues: Negative Disc levels: Mild left asymmetric disc bulge at L4-5 with narrowing of the left lateral recess. The other disc levels are normal. IMPRESSION: 1. No acute abnormality. 2. Normal cervical and thoracic spine. 3. L4-5 left asymmetric disc bulge with narrowing of the left lateral recess. Correlate for L4 radiculopathy.  Electronically Signed   By: Ulyses Jarred M.D.   On: 01/02/2020 03:15   DG Lumbar  Puncture Fluoro Guide  Result Date: 01/01/2020 CLINICAL DATA:  Headache, unsuccessful lumbar puncture in the emergency room department. EXAM: DIAGNOSTIC LUMBAR PUNCTURE UNDER FLUOROSCOPIC GUIDANCE FLUOROSCOPY TIME:  Fluoroscopy Time:  0.9 minutes Number of Acquired Spot Images: 2 PROCEDURE: Informed consent was obtained from the patient prior to the procedure, including potential complications of headache, allergy, and pain. With the patient prone, the lower back was prepped with Betadine. 1% Lidocaine was used for local anesthesia. Lumbar puncture was performed at the L4-5 level level using a 16 gauge needle with return of reddish CSF. Twelve ml of CSF were obtained for laboratory studies. The patient tolerated the procedure well and there were no apparent complications. IMPRESSION: Successful fluoroscopic guided lumbar puncture with 12 cc of reddish CSF (suggesting bloody tap) obtained and sent to the lab. Electronically Signed   By: Franki Cabot M.D.   On: 01/01/2020 17:12        Scheduled Meds: . baclofen  10 mg Oral TID  . diphenhydrAMINE  25 mg Intravenous Q12H  . loratadine  10 mg Oral Daily  . prochlorperazine  10 mg Intravenous Q6H  . sodium chloride flush  3 mL Intravenous Once  . sodium chloride flush  3 mL Intravenous Q12H   Continuous Infusions: . lactated ringers 50 mL/hr at 01/02/20 2302     LOS: 1 day   Time spent= 35 mins    Saphia Vanderford Arsenio Loader, MD [facility]  If 7PM-7AM, please contact night-coverage  01/03/2020, 12:56 PM

## 2020-01-03 NOTE — Progress Notes (Addendum)
Neurology Progress Note   S:// Seen and examined.  Reports some improvement in headache but still continuing headache.  Also reports some off-and-on blurred vision.  No chest pain, no nausea no vomiting. Also reports neck pain at this time at the back of her head going into her neck. CT head-no evidence of acute bleed-was done within 6 hours of presentation. Spinal tap-consistent with a bloody tap-turbid CSF, with RBC count of 160,000, WBC 194, protein 142, glucose 88.  No evidence of xanthochromia.   O:// Current vital signs: BP 109/80 (BP Location: Right Arm)   Pulse 85   Temp 98.9 F (37.2 C) (Oral)   Resp 17   Ht 5\' 4"  (1.626 m)   Wt 63.4 kg   SpO2 100%   BMI 23.99 kg/m  Vital signs in last 24 hours: Temp:  [98.7 F (37.1 C)-99.9 F (37.7 C)] 98.9 F (37.2 C) (04/19 0743) Pulse Rate:  [75-129] 85 (04/19 0743) Resp:  [15-23] 17 (04/19 0743) BP: (91-127)/(61-80) 109/80 (04/19 0743) SpO2:  [99 %-100 %] 100 % (04/19 0743)  General: Awake alert in no distress HEENT: Palpable tenderness on the neck but no neck stiffness. CVS: Regular rhythm Respiratory: Breathing well saturating normally on room air Neurological exam Awake alert oriented x3 Not aphasic No dysarthria Cranial nerves: Pupil equal round react light, extraocular was intact, visual field full, extraocular movements intact, facial sensation intact, face symmetric, tongue and palate midline. Motor exam: 5/5 all over Sensory exam: Intact light touch all over without extinction Cerebellar: No ataxia  Medications  Current Facility-Administered Medications:  .  acetaminophen (TYLENOL) tablet 500 mg, 500 mg, Oral, Q6H PRN, Bell, Thomas N, DO .  diphenhydrAMINE (BENADRYL) capsule 25 mg, 25 mg, Oral, Q6H PRN, Amin, Ankit Chirag, MD, 25 mg at 01/02/20 0844 .  diphenhydrAMINE (BENADRYL) injection 25 mg, 25 mg, Intravenous, Q12H, Vonzella Nipple, NP, 25 mg at 01/02/20 2108 .  HYDROmorphone (DILAUDID) injection  0.5-1 mg, 0.5-1 mg, Intravenous, Q2H PRN, Arlan Organ, DO, 1 mg at 01/02/20 0529 .  ketorolac (TORADOL) 30 MG/ML injection 30 mg, 30 mg, Intravenous, Q12H, Vonzella Nipple, NP, 30 mg at 01/02/20 2108 .  lactated ringers infusion, , Intravenous, Continuous, BellYevonne Aline, DO, Last Rate: 50 mL/hr at 01/02/20 2302, New Bag at 01/02/20 2302 .  loratadine (CLARITIN) tablet 10 mg, 10 mg, Oral, Daily, Bell, Thomas N, DO, 10 mg at 01/02/20 1101 .  meclizine (ANTIVERT) tablet 12.5 mg, 12.5 mg, Oral, TID PRN, Arlan Organ, DO, 12.5 mg at 01/03/20 0431 .  naproxen (NAPROSYN) tablet 500 mg, 500 mg, Oral, BID PRN, Arlan Organ, DO, 500 mg at 01/02/20 1823 .  oxyCODONE (Oxy IR/ROXICODONE) immediate release tablet 5 mg, 5 mg, Oral, Q4H PRN, Arlan Organ, DO, 5 mg at 01/02/20 1236 .  polyethylene glycol (MIRALAX / GLYCOLAX) packet 17 g, 17 g, Oral, Daily PRN, Bell, Thomas N, DO .  potassium chloride SA (KLOR-CON) CR tablet 40 mEq, 40 mEq, Oral, Once, Amin, Ankit Chirag, MD .  prochlorperazine (COMPAZINE) injection 10 mg, 10 mg, Intravenous, Q6H, Vonzella Nipple, NP, 10 mg at 01/03/20 [id number] .  promethazine (PHENERGAN) tablet 12.5 mg, 12.5 mg, Oral, Q6H PRN, Bell, Thomas N, DO .  sodium chloride flush (NS) 0.9 % injection 3 mL, 3 mL, Intravenous, Once, Delo, Douglas, MD .  sodium chloride flush (NS) 0.9 % injection 3 mL, 3 mL, Intravenous, Q12H, Bell, Thomas N, DO, 3 mL at 01/02/20 2303 Labs CBC  Component Value Date/Time   WBC 12.0 (H) 01/02/2020 0449   RBC 4.14 01/02/2020 0449   HGB 13.1 01/02/2020 0449   HGB 12.2 06/12/2018 1420   HCT 38.8 01/02/2020 0449   HCT 35.2 06/12/2018 1420   PLT 300 01/02/2020 0449   PLT 237 06/12/2018 1420   MCV 93.7 01/02/2020 0449   MCV 91 06/12/2018 1420   MCH 31.6 01/02/2020 0449   MCHC 33.8 01/02/2020 0449   RDW 11.9 01/02/2020 0449   RDW 12.4 06/12/2018 1420   LYMPHSABS 2.2 01/01/2020 1106   LYMPHSABS 2.1 06/12/2018 1420   MONOABS 0.3 01/01/2020  1106   EOSABS 0.3 01/01/2020 1106   EOSABS 0.2 06/12/2018 1420   BASOSABS 0.0 01/01/2020 1106   BASOSABS 0.0 06/12/2018 1420    CMP     Component Value Date/Time   NA 139 01/03/2020 0427   NA 138 06/12/2018 1420   K 3.3 (L) 01/03/2020 0427   CL 107 01/03/2020 0427   CO2 22 01/03/2020 0427   GLUCOSE 127 (H) 01/03/2020 0427   GLUCOSE 78 02/06/2016 1149   BUN 7 01/03/2020 0427   BUN 13 06/12/2018 1420   CREATININE 0.57 01/03/2020 0427   CREATININE 0.67 11/06/2016 1110   CALCIUM 8.7 (L) 01/03/2020 0427   PROT 6.6 01/02/2020 0449   PROT 6.7 06/12/2018 1420   ALBUMIN 3.4 (L) 01/02/2020 0449   ALBUMIN 4.4 06/12/2018 1420   AST 20 01/02/2020 0449   ALT 22 01/02/2020 0449   ALKPHOS 34 (L) 01/02/2020 0449   BILITOT 0.4 01/02/2020 0449   BILITOT 0.4 06/12/2018 1420   GFRNONAA >60 01/03/2020 0427   GFRAA >60 01/03/2020 0427    Imaging I have reviewed images in epic and the results pertinent to this consultation are: CT head-normal CTA-head and neck-no evidence of an aneurysm. MRI brain did not reveal an acute abnormality but on further discussion with the neurologist and neuroradiologist on call, there was some concern for increased flair signal in the interpeduncular cistern and right ambient cistern-although favored to be a CSF pulsation artifact, given the unusual clinical presentation, they recommended acquisition of a 3D FLAIR sequence to be able to limit that artifact and evaluate the basal cisterns more clearly.  Assessment: 39 year old with abrupt onset of a thunderclap headache on top of a mild chronic baseline headache going on since 12/31/2018.  Some improvement off-and-on with migraine cocktail. CT head unremarkable for any acute bleed that was done on presentation. CTA head and neck with no evidence of an aneurysm to suggest an residual rupture in the subarachnoid causing this headache.  No evidence of vasospasm seen on the static pictures obtained on CTA. Continues to  complain of off-and-on headache and currently still has headache with neck pain. Will repeat a head CT and also will obtain an MRI brain without contrast-3D flair images to remove the questionable artifact that was seen on the MRI brain from 01/02/2020  Impression: Headache-severe, episodic Evaluate for status migrainosus.  Other differentials include posterior reversible encephalopathy syndrome (PRES), RCVS  Recommendations: Continue with migraine cocktail as needed Repeat head CT Repeat MRI brain without contrast-with specific 3D FLAIR images to be obtained-order will be placed. We will consider repeat CTA head and neck based on the CT and MRI findings. Might need to repeat an LP if continues to be symptomatic-decision after imaging and based on further clinical course We will follow  -- Amie Portland, MD [facility] Pager: [phone] If 7pm to 7am, please call on call as  listed on AMION.   ADDENDUM  -CTH and MR brain completed.  Reviewed and D/W Dr. Nevada Crane, neurorads -There is a clear evidence of trace left superior convexity subdural which is not present by MRI yesterday.  Also what is interesting is that there is a small volume of hyperdensity in the interpeduncular cistern and prepontine cistern concerning for subarachnoid hemorrhage MRI corroborates trace subarachnoid hemorrhage but furthermore also reinforces the trace left superior subconvexity subdural hematoma new from yesterday.  It is a very unusual radiographic picture.  Venous sinuses appear patent on the CTA as well as MRI scans on the T2 images. Still no evidence of a clear aneurysm on the CT after multiple reviews with neuroradiology.  This makes me suspect that the LP findings are concerning for a subarachnoid rather than a traumatic tap.  Due to the patient's ongoing symptoms, I would discuss the case with interventional radiology for a possible diagnostic cerebral angiogram.  I will update further  recommendations as I get more information and after discussions with other specialists.  -- Amie Portland, MD [facility] Pager: [phone] If 7pm to 7am, please call on call as listed on AMION.

## 2020-01-03 NOTE — Progress Notes (Signed)
Went into patients room due to iv beeping; pt had tried to manipulate data on pump, pump on pause stuck in neonatal mode; educated patient not to touch buttons on the pump.  Pt sitting on side of bed with heart rate sustaining in the 160's.  RN tried to get patient to return to bed for safety issues.  Patient states she is praying.  Patient left on side of the bed with alarm set.

## 2020-01-03 NOTE — Progress Notes (Signed)
Patient called RN in to room to c/o dizziness and pressure bilateral ears.  On-call notified.

## 2020-01-03 NOTE — Progress Notes (Signed)
Patient had one episode of vomitting on the floor.

## 2020-01-03 NOTE — Progress Notes (Signed)
Chief Complaint: Patient was seen in consultation today for cerebral arteriogram  Referring Physician(s): *Dr. Amie Portland  Supervising Physician: Luanne Bras  Patient Status: [facility] - In-pt  History of Present Illness: Sylvia Best is a 39 y.o. female admitted with sudden onset of headache. Her workup is suspicious for subtle subarachnoid hemorrhage. NIR is asked to perform formal cerebral arteriogram to further eval. PMHx, meds, labs, imaging, allergies reviewed. Feeling some better. Family at bedside.   Past Medical History:  Diagnosis Date  . Asthma   . Diabetes mellitus    gestational  . GERD (gastroesophageal reflux disease)    no meds  . Gestational diabetes     Past Surgical History:  Procedure Laterality Date  . CESAREAN SECTION  04/26/2011   Procedure: CESAREAN SECTION;  Surgeon: Donnamae Jude, MD;  Location: [facility];  Service: Gynecology;  Laterality: N/A;  . CESAREAN SECTION N/A 04/05/2016   Procedure: CESAREAN SECTION;  Surgeon: Osborne Oman, MD;  Location: [facility];  Service: Obstetrics;  Laterality: N/A;  . DILATION AND CURETTAGE OF UTERUS    . TONSILLECTOMY     age 1    Allergies: Patient has no known allergies.  Medications:  Current Facility-Administered Medications:  .  acetaminophen (TYLENOL) tablet 500 mg, 500 mg, Oral, Q6H PRN, Arlan Organ, DO .  baclofen (LIORESAL) tablet 10 mg, 10 mg, Oral, TID, Amin, Ankit Chirag, MD, 10 mg at 01/03/20 1205 .  diphenhydrAMINE (BENADRYL) capsule 25 mg, 25 mg, Oral, Q6H PRN, Amin, Ankit Chirag, MD, 25 mg at 01/02/20 0844 .  diphenhydrAMINE (BENADRYL) injection 25 mg, 25 mg, Intravenous, Q12H, Laurey Morale N, NP, 25 mg at 01/03/20 1018 .  HYDROmorphone (DILAUDID) injection 0.5-1 mg, 0.5-1 mg, Intravenous, Q2H PRN, Darien Ramus N, DO, 1 mg at 01/03/20 1259 .  lactated ringers infusion, , Intravenous, Continuous, BellYevonne Aline, DO, Last Rate: 50 mL/hr at 01/02/20 2302, New Bag at  01/02/20 2302 .  loratadine (CLARITIN) tablet 10 mg, 10 mg, Oral, Daily, Bell, Thomas N, DO, 10 mg at 01/03/20 1018 .  meclizine (ANTIVERT) tablet 12.5 mg, 12.5 mg, Oral, TID PRN, Arlan Organ, DO, 12.5 mg at 01/03/20 0431 .  oxyCODONE (Oxy IR/ROXICODONE) immediate release tablet 5 mg, 5 mg, Oral, Q4H PRN, Arlan Organ, DO, 5 mg at 01/02/20 1236 .  polyethylene glycol (MIRALAX / GLYCOLAX) packet 17 g, 17 g, Oral, Daily PRN, Darien Ramus N, DO .  prochlorperazine (COMPAZINE) injection 10 mg, 10 mg, Intravenous, Q6H, Williams, Jessica N, NP, 10 mg at 01/03/20 1018 .  promethazine (PHENERGAN) tablet 12.5 mg, 12.5 mg, Oral, Q6H PRN, Bell, Thomas N, DO .  sodium chloride flush (NS) 0.9 % injection 3 mL, 3 mL, Intravenous, Once, Delo, Douglas, MD .  sodium chloride flush (NS) 0.9 % injection 3 mL, 3 mL, Intravenous, Q12H, Bell, Thomas N, DO, 3 mL at 01/03/20 1019    Family History  Problem Relation Age of Onset  . Hypertension Mother   . Heart disease Father   . Diabetes Father   . Diabetes Paternal Grandfather     Social History   Socioeconomic History  . Marital status: Married    Spouse name: Not on file  . Number of children: Not on file  . Years of education: Not on file  . Highest education level: Associate degree: occupational, Hotel manager, or vocational program  Occupational History  . Not on file  Tobacco Use  . Smoking status: Never Smoker  .  Smokeless tobacco: Never Used  Substance and Sexual Activity  . Alcohol use: No  . Drug use: No  . Sexual activity: Yes    Birth control/protection: Pill  Other Topics Concern  . Not on file  Social History Narrative  . Not on file   Social Determinants of Health   Financial Resource Strain:   . Difficulty of Paying Living Expenses:   Food Insecurity:   . Worried About Charity fundraiser in the Last Year:   . Arboriculturist in the Last Year:   Transportation Needs: No Transportation Needs  . Lack of Transportation  (Medical): No  . Lack of Transportation (Non-Medical): No  Physical Activity:   . Days of Exercise per Week:   . Minutes of Exercise per Session:   Stress:   . Feeling of Stress :   Social Connections:   . Frequency of Communication with Friends and Family:   . Frequency of Social Gatherings with Friends and Family:   . Attends Religious Services:   . Active Member of Clubs or Organizations:   . Attends Archivist Meetings:   Marland Kitchen Marital Status:     Review of Systems: A 12 point ROS discussed and pertinent positives are indicated in the HPI above.  All other systems are negative.  Review of Systems  Vital Signs: BP 135/81 (BP Location: Left Arm)   Pulse 96   Temp 98.7 F (37.1 C) (Oral)   Resp 18   Ht 5\' 4"  (1.626 m)   Wt 63.4 kg   SpO2 100%   BMI 23.99 kg/m   Physical Exam Constitutional:      General: She is not in acute distress.    Appearance: She is not ill-appearing.  HENT:     Head: Normocephalic.  Eyes:     Extraocular Movements: Extraocular movements intact.     Pupils: Pupils are equal, round, and reactive to light.  Cardiovascular:     Rate and Rhythm: Normal rate and regular rhythm.     Pulses: Normal pulses.     Heart sounds: Normal heart sounds.  Pulmonary:     Effort: Pulmonary effort is normal. No respiratory distress.     Breath sounds: Normal breath sounds.  Skin:    General: Skin is warm and dry.  Neurological:     General: No focal deficit present.     Mental Status: She is alert and oriented to person, place, and time.  Psychiatric:        Mood and Affect: Mood normal.        Thought Content: Thought content normal.        Judgment: Judgment normal.      Imaging: CT Angio Head W or Wo Contrast  Result Date: 01/01/2020 CLINICAL DATA:  Severe headache. Normal head CT. Bloody lumbar puncture. EXAM: CT ANGIOGRAPHY HEAD AND NECK TECHNIQUE: Multidetector CT imaging of the head and neck was performed using the standard protocol  during bolus administration of intravenous contrast. Multiplanar CT image reconstructions and MIPs were obtained to evaluate the vascular anatomy. Carotid stenosis measurements (when applicable) are obtained utilizing NASCET criteria, using the distal internal carotid diameter as the denominator. CONTRAST:  60mL OMNIPAQUE IOHEXOL 350 MG/ML SOLN COMPARISON:  CT head without contrast 01/01/2019 FINDINGS: CTA NECK FINDINGS Aortic arch: 3 vessel arch configuration is present. No significant atherosclerotic calcification are aneurysm is present. No stenosis is present. Right carotid system: Right common carotid artery is within. Bifurcation is within normal  limits. Cervical right ICA is normal. Left carotid system: The left common carotid artery is within normal limits. Bifurcation is unremarkable. Cervical left ICA is normal. Vertebral arteries: The left vertebral artery is the dominant vessel. Both vertebral arteries originate from the subclavian arteries. No significant stenosis is present. Skeleton: Atherosclerotic calcifications are present that vertebral body heights and alignment are normal. No focal lytic or blastic lesions are present. Other neck: Soft tissues the neck unremarkable. Upper chest: The lung apices are clear. Thoracic inlet is normal. Review of the MIP images confirms the above findings CTA HEAD FINDINGS Anterior circulation: The internal carotid artery within normal limits bilaterally. ICA termini are normal. The A1 and M1 segments are normal. MCA bifurcations are within normal limits. The A1 and M1 segments are normal. The ACA and MCA branch vessels are within normal limits. No aneurysm is present. Posterior circulation: The left vertebral artery is the dominant vessel. Vertebrobasilar junction is normal. AICA vessels are dominant. The right posterior cerebral artery originates from the basilar tip. The left posterior cerebral artery is of fetal type. PCA branch vessels are normal. No aneurysm is  present. Venous sinuses: Dural sinuses are patent. The straight sinus and deep cerebral veins are intact. Cortical veins are unremarkable. Anatomic variants: Fetal type left posterior cerebral artery. Review of the MIP images confirms the above findings IMPRESSION: 1. Negative CTA of the neck. 2. Normal variant CTA Circle of Willis without significant proximal stenosis, aneurysm, or branch vessel occlusion. Electronically Signed   By: San Morelle M.D.   On: 01/01/2020 19:59   CT HEAD WO CONTRAST  Addendum Date: 01/03/2020   ADDENDUM REPORT: 01/03/2020 11:25 ADDENDUM: This case was discussed by telephone with Dr. Amie Portland on 01/03/2020 at 10:54, and furthermore the repeat brain MRI today seems to demonstrate a trace left superior convexity subdural hematoma which was not present by MRI yesterday (please see that report). Case also was re-discussed with Dr. Malen Gauze at at 11:24 am. Electronically Signed   By: Genevie Ann M.D.   On: 01/03/2020 11:25   Result Date: 01/03/2020 CLINICAL DATA:  39 year old female with abrupt onset of thunderclap headache 12/31/2018. Appearance suspicious for basilar cistern subarachnoid hemorrhage versus artifact on brain MRI yesterday. EXAM: CT HEAD WITHOUT CONTRAST TECHNIQUE: Contiguous axial images were obtained from the base of the skull through the vertex without intravenous contrast. COMPARISON:  Head CT and CTA head and neck 01/01/2020. Brain MRI yesterday. FINDINGS: Brain: New since 01/01/2020 small but suspicious volume of hyperdensity in the interpeduncular cistern (series 3, image 12) and prepontine cistern. See also sagittal image 32. No intraventricular hemorrhage or ventriculomegaly. No other intracranial blood identified. No midline shift, mass effect, or evidence of intracranial mass lesion. Gray-white matter differentiation remains normal. No cortically based acute infarct identified. Vascular: No suspicious intracranial vascular hyperdensity. Skull: Stable and  negative. Sinuses/Orbits: There are scattered small sinus fluid levels and bubbly opacity, not significantly changed from 01/01/2020. Tympanic cavities and mastoids remain clear. Other: Visualized orbits and scalp soft tissues are within normal limits. IMPRESSION: Noncontrast CT appearance now suspicious for Trace Subarachnoid Hemorrhage in the basilar cisterns. No intracranial mass effect. No intraventricular hemorrhage. Electronically Signed: By: Genevie Ann M.D. On: 01/03/2020 10:48   CT HEAD WO CONTRAST  Result Date: 01/01/2020 CLINICAL DATA:  Headache. EXAM: CT HEAD WITHOUT CONTRAST TECHNIQUE: Contiguous axial images were obtained from the base of the skull through the vertex without intravenous contrast. COMPARISON:  None. FINDINGS: Brain: No evidence of acute infarction, hemorrhage,  hydrocephalus, extra-axial collection or mass lesion/mass effect. Vascular: No hyperdense vessel or unexpected calcification. Skull: Normal. Negative for fracture or focal lesion. Sinuses/Orbits: Mild bilateral ethmoid and maxillary sinusitis. Other: None. IMPRESSION: Mild bilateral ethmoid and maxillary sinusitis. No acute intracranial abnormality seen. Electronically Signed   By: Marijo Conception M.D.   On: 01/01/2020 11:43   CT Angio Neck W and/or Wo Contrast  Result Date: 01/01/2020 CLINICAL DATA:  Severe headache. Normal head CT. Bloody lumbar puncture. EXAM: CT ANGIOGRAPHY HEAD AND NECK TECHNIQUE: Multidetector CT imaging of the head and neck was performed using the standard protocol during bolus administration of intravenous contrast. Multiplanar CT image reconstructions and MIPs were obtained to evaluate the vascular anatomy. Carotid stenosis measurements (when applicable) are obtained utilizing NASCET criteria, using the distal internal carotid diameter as the denominator. CONTRAST:  56mL OMNIPAQUE IOHEXOL 350 MG/ML SOLN COMPARISON:  CT head without contrast 01/01/2019 FINDINGS: CTA NECK FINDINGS Aortic arch: 3 vessel  arch configuration is present. No significant atherosclerotic calcification are aneurysm is present. No stenosis is present. Right carotid system: Right common carotid artery is within. Bifurcation is within normal limits. Cervical right ICA is normal. Left carotid system: The left common carotid artery is within normal limits. Bifurcation is unremarkable. Cervical left ICA is normal. Vertebral arteries: The left vertebral artery is the dominant vessel. Both vertebral arteries originate from the subclavian arteries. No significant stenosis is present. Skeleton: Atherosclerotic calcifications are present that vertebral body heights and alignment are normal. No focal lytic or blastic lesions are present. Other neck: Soft tissues the neck unremarkable. Upper chest: The lung apices are clear. Thoracic inlet is normal. Review of the MIP images confirms the above findings CTA HEAD FINDINGS Anterior circulation: The internal carotid artery within normal limits bilaterally. ICA termini are normal. The A1 and M1 segments are normal. MCA bifurcations are within normal limits. The A1 and M1 segments are normal. The ACA and MCA branch vessels are within normal limits. No aneurysm is present. Posterior circulation: The left vertebral artery is the dominant vessel. Vertebrobasilar junction is normal. AICA vessels are dominant. The right posterior cerebral artery originates from the basilar tip. The left posterior cerebral artery is of fetal type. PCA branch vessels are normal. No aneurysm is present. Venous sinuses: Dural sinuses are patent. The straight sinus and deep cerebral veins are intact. Cortical veins are unremarkable. Anatomic variants: Fetal type left posterior cerebral artery. Review of the MIP images confirms the above findings IMPRESSION: 1. Negative CTA of the neck. 2. Normal variant CTA Circle of Willis without significant proximal stenosis, aneurysm, or branch vessel occlusion. Electronically Signed   By:  San Morelle M.D.   On: 01/01/2020 19:59   MR BRAIN WO CONTRAST  Result Date: 01/03/2020 CLINICAL DATA:  39 year old female with abrupt onset of thunderclap headache 12/31/2018. Appearance suspicious for basilar cistern subarachnoid hemorrhage versus artifact on brain MRI yesterday. Abnormal follow-up noncontrast head CT just now. EXAM: MRI HEAD WITHOUT CONTRAST TECHNIQUE: Multiplanar, multiecho pulse sequences of the brain and surrounding structures were obtained without intravenous contrast. COMPARISON:  Head CT today reported separately. Brain MRI 01/02/2020. FINDINGS: Brain: Trace but suspicious FLAIR hyperintensity in the interpeduncular cistern, prepontine cisterns. Suspicious appearance in the right prepontine cistern on susceptibility weighted imaging, and T1 weighted imaging also appears abnormally heterogeneous in the interpeduncular cistern (series 5, image 12), as well as layering on the pituitary in the suprasellar cistern. Furthermore, there is evidence of trace subdural hematoma over the left superior convexity on  axial FLAIR, SWI, sagittal T1 and coronal T2 (series 9, image 11 and series 5, image 17). This is new from the MRI yesterday. No other extra-axial blood identified. No intraventricular blood products identified. No restricted diffusion to suggest acute infarction. No midline shift, mass effect, evidence of mass lesion, ventriculomegaly. Cervicomedullary junction and pituitary are within normal limits. Parenchymal gray and white matter signal appears stable and within normal limits. No areas of cerebral edema identified. No definite chronic cerebral blood products. Vascular: Major intracranial vascular flow voids appear stable and within normal limits. Skull and upper cervical spine: Negative visible cervical spine. Visualized bone marrow signal is within normal limits. Sinuses/Orbits: Negative orbits. Paranasal sinus fluid levels again noted. Other: Mastoids remain clear. Visible  internal auditory structures appear normal. Scalp and face soft tissues appear negative. IMPRESSION: 1. Corroborating evidence of trace subarachnoid hemorrhage, but furthermore there appears to be also a trace left superior convexity Subdural Hematoma (series 9, image 11) which is new from the MRI yesterday. 2. This pattern of acute blood is unusual and of unclear etiology. No evidence of acute infarct or cerebral edema, brain parenchyma appears stable and negative. 3. This case was discussed by telephone with Dr. Amie Portland on 01/03/2020 at 10:54. Electronically Signed   By: Genevie Ann M.D.   On: 01/03/2020 11:06   MR BRAIN WO CONTRAST  Addendum Date: 01/02/2020   ADDENDUM REPORT: 01/02/2020 05:40 ADDENDUM: Case was discussed with Dr. Kerney Elbe at 5:30 a.m. on 01/02/2019. There is hyperintense signal on the axial FLAIR sequence within the interpeduncular cistern and right ambient cistern. This is favored to be secondary to a CSF pulsation artifact. There is no corresponding abnormality on any other sequence and this is a common location for failure of fluid signal suppression. Given the unusual clinical presentation, if there is continued clinical concern for subarachnoid hemorrhage, a 3D FLAIR acquisition should be able to eliminate this artifact. Alternatively, repeating the noncontrast head CT should also be able to exclude perimesencephalic hemorrhage. Electronically Signed   By: Ulyses Jarred M.D.   On: 01/02/2020 05:40   Result Date: 01/02/2020 CLINICAL DATA:  Thunderclap headache EXAM: MRI HEAD WITHOUT CONTRAST TECHNIQUE: Multiplanar, multiecho pulse sequences of the brain and surrounding structures were obtained without intravenous contrast. COMPARISON:  None. FINDINGS: BRAIN: No acute infarct, acute hemorrhage or extra-axial collection. Normal white matter signal for age. Normal volume of brain parenchyma and CSF spaces. Midline structures are normal. VASCULAR: Major flow voids are preserved.  Susceptibility-sensitive sequences show no chronic microhemorrhage or superficial siderosis. SKULL AND UPPER CERVICAL SPINE: Normal calvarium and skull base. Visualized upper cervical spine and soft tissues are normal. SINUSES/ORBITS: No paranasal sinus fluid levels or advanced mucosal thickening. No mastoid or middle ear effusion. Normal orbits. IMPRESSION: Normal brain MRI. Electronically Signed: By: Ulyses Jarred M.D. On: 01/02/2020 02:55   MR CERVICAL SPINE WO CONTRAST  Result Date: 01/02/2020 CLINICAL DATA:  Severe headache EXAM: MRI CERVICAL, THORACIC AND LUMBAR SPINE WITHOUT CONTRAST TECHNIQUE: Multiplanar and multiecho pulse sequences of the cervical spine, to include the craniocervical junction and cervicothoracic junction, and thoracic and lumbar spine, were obtained without intravenous contrast. COMPARISON:  None. FINDINGS: MRI CERVICAL SPINE FINDINGS Alignment: Physiologic. Vertebrae: No fracture, evidence of discitis, or bone lesion. Cord: Normal signal and morphology. Posterior Fossa, vertebral arteries, paraspinal tissues: Negative. Disc levels: No disc herniation or stenosis MRI THORACIC SPINE FINDINGS Alignment:  Physiologic. Vertebrae: No fracture, evidence of discitis, or bone lesion. Cord:  Normal signal and morphology.  Paraspinal and other soft tissues: Negative. Disc levels: No disc herniation or stenosis MRI LUMBAR SPINE FINDINGS Segmentation:  Standard. Alignment:  Physiologic. Vertebrae:  No fracture, evidence of discitis, or bone lesion. Conus medullaris and cauda equina: Conus extends to the L1 level. Conus and cauda equina appear normal. Paraspinal and other soft tissues: Negative Disc levels: Mild left asymmetric disc bulge at L4-5 with narrowing of the left lateral recess. The other disc levels are normal. IMPRESSION: 1. No acute abnormality. 2. Normal cervical and thoracic spine. 3. L4-5 left asymmetric disc bulge with narrowing of the left lateral recess. Correlate for L4  radiculopathy. Electronically Signed   By: Ulyses Jarred M.D.   On: 01/02/2020 03:15   MR THORACIC SPINE WO CONTRAST  Result Date: 01/02/2020 CLINICAL DATA:  Severe headache EXAM: MRI CERVICAL, THORACIC AND LUMBAR SPINE WITHOUT CONTRAST TECHNIQUE: Multiplanar and multiecho pulse sequences of the cervical spine, to include the craniocervical junction and cervicothoracic junction, and thoracic and lumbar spine, were obtained without intravenous contrast. COMPARISON:  None. FINDINGS: MRI CERVICAL SPINE FINDINGS Alignment: Physiologic. Vertebrae: No fracture, evidence of discitis, or bone lesion. Cord: Normal signal and morphology. Posterior Fossa, vertebral arteries, paraspinal tissues: Negative. Disc levels: No disc herniation or stenosis MRI THORACIC SPINE FINDINGS Alignment:  Physiologic. Vertebrae: No fracture, evidence of discitis, or bone lesion. Cord:  Normal signal and morphology. Paraspinal and other soft tissues: Negative. Disc levels: No disc herniation or stenosis MRI LUMBAR SPINE FINDINGS Segmentation:  Standard. Alignment:  Physiologic. Vertebrae:  No fracture, evidence of discitis, or bone lesion. Conus medullaris and cauda equina: Conus extends to the L1 level. Conus and cauda equina appear normal. Paraspinal and other soft tissues: Negative Disc levels: Mild left asymmetric disc bulge at L4-5 with narrowing of the left lateral recess. The other disc levels are normal. IMPRESSION: 1. No acute abnormality. 2. Normal cervical and thoracic spine. 3. L4-5 left asymmetric disc bulge with narrowing of the left lateral recess. Correlate for L4 radiculopathy. Electronically Signed   By: Ulyses Jarred M.D.   On: 01/02/2020 03:15   MR LUMBAR SPINE WO CONTRAST  Result Date: 01/02/2020 CLINICAL DATA:  Severe headache EXAM: MRI CERVICAL, THORACIC AND LUMBAR SPINE WITHOUT CONTRAST TECHNIQUE: Multiplanar and multiecho pulse sequences of the cervical spine, to include the craniocervical junction and  cervicothoracic junction, and thoracic and lumbar spine, were obtained without intravenous contrast. COMPARISON:  None. FINDINGS: MRI CERVICAL SPINE FINDINGS Alignment: Physiologic. Vertebrae: No fracture, evidence of discitis, or bone lesion. Cord: Normal signal and morphology. Posterior Fossa, vertebral arteries, paraspinal tissues: Negative. Disc levels: No disc herniation or stenosis MRI THORACIC SPINE FINDINGS Alignment:  Physiologic. Vertebrae: No fracture, evidence of discitis, or bone lesion. Cord:  Normal signal and morphology. Paraspinal and other soft tissues: Negative. Disc levels: No disc herniation or stenosis MRI LUMBAR SPINE FINDINGS Segmentation:  Standard. Alignment:  Physiologic. Vertebrae:  No fracture, evidence of discitis, or bone lesion. Conus medullaris and cauda equina: Conus extends to the L1 level. Conus and cauda equina appear normal. Paraspinal and other soft tissues: Negative Disc levels: Mild left asymmetric disc bulge at L4-5 with narrowing of the left lateral recess. The other disc levels are normal. IMPRESSION: 1. No acute abnormality. 2. Normal cervical and thoracic spine. 3. L4-5 left asymmetric disc bulge with narrowing of the left lateral recess. Correlate for L4 radiculopathy. Electronically Signed   By: Ulyses Jarred M.D.   On: 01/02/2020 03:15   DG Lumbar Puncture Fluoro Guide  Result Date: 01/01/2020 CLINICAL  DATA:  Headache, unsuccessful lumbar puncture in the emergency room department. EXAM: DIAGNOSTIC LUMBAR PUNCTURE UNDER FLUOROSCOPIC GUIDANCE FLUOROSCOPY TIME:  Fluoroscopy Time:  0.9 minutes Number of Acquired Spot Images: 2 PROCEDURE: Informed consent was obtained from the patient prior to the procedure, including potential complications of headache, allergy, and pain. With the patient prone, the lower back was prepped with Betadine. 1% Lidocaine was used for local anesthesia. Lumbar puncture was performed at the L4-5 level level using a 16 gauge needle with return  of reddish CSF. Twelve ml of CSF were obtained for laboratory studies. The patient tolerated the procedure well and there were no apparent complications. IMPRESSION: Successful fluoroscopic guided lumbar puncture with 12 cc of reddish CSF (suggesting bloody tap) obtained and sent to the lab. Electronically Signed   By: Franki Cabot M.D.   On: 01/01/2020 17:12    Labs:  CBC: Recent Labs    01/01/20 1106 01/02/20 0449  WBC 4.9 12.0*  HGB 13.5 13.1  HCT 39.9 38.8  PLT 255 300    COAGS: Recent Labs    01/01/20 1106  INR 1.1  APTT 28    BMP: Recent Labs    01/01/20 1106 01/02/20 0449 01/03/20 0427  NA 136 136 139  K 3.8 3.6 3.3*  CL 107 108 107  CO2 20* 15* 22  GLUCOSE 157* 194* 127*  BUN 8 8 7   CALCIUM 8.9 9.0 8.7*  CREATININE 0.62 0.67 0.57  GFRNONAA >60 >60 >60  GFRAA >60 >60 >60    LIVER FUNCTION TESTS: Recent Labs    01/01/20 1106 01/02/20 0449  BILITOT 0.5 0.4  AST 24 20  ALT 24 22  ALKPHOS 36* 34*  PROT 7.1 6.6  ALBUMIN 3.7 3.4*    TUMOR MARKERS: No results for input(s): AFPTM, CEA, CA199, CHROMGRNA in the last 8760 hours.  Assessment and Plan: Subarachnoid hemorrhage For cerebral arteriogram tomorrow. NPO p MN Risks and benefits of cerebral angio were discussed with the patient including, but not limited to bleeding, infection, vascular injury or contrast induced renal failure.  This interventional procedure involves the use of X-rays and because of the nature of the planned procedure, it is possible that we will have prolonged use of X-ray fluoroscopy.  Potential radiation risks to you include (but are not limited to) the following: - A slightly elevated risk for cancer  several years later in life. This risk is typically less than 0.5% percent. This risk is low in comparison to the normal incidence of human cancer, which is 33% for women and 50% for men according to the [place]. - Radiation induced injury can include skin  redness, resembling a rash, tissue breakdown / ulcers and hair loss (which can be temporary or permanent).   The likelihood of either of these occurring depends on the difficulty of the procedure and whether you are sensitive to radiation due to previous procedures, disease, or genetic conditions.   IF your procedure requires a prolonged use of radiation, you will be notified and given written instructions for further action.  It is your responsibility to monitor the irradiated area for the 2 weeks following the procedure and to notify your physician if you are concerned that you have suffered a radiation induced injury.    All of the patient's questions were answered, patient is agreeable to proceed.  Consent signed and in chart.   Thank you for this interesting consult.  I greatly enjoyed meeting Cicley Prestia and look forward to participating  in their care.  A copy of this report was sent to the requesting provider on this date.  Electronically Signed: Ascencion Dike, PA-C 01/03/2020, 3:55 PM   I spent a total of 20 minutes in face to face in clinical consultation, greater than 50% of which was counseling/coordinating care for cerebral angio

## 2020-01-04 ENCOUNTER — Inpatient Hospital Stay (HOSPITAL_COMMUNITY): Payer: BLUE CROSS/BLUE SHIELD

## 2020-01-04 DIAGNOSIS — R112 Nausea with vomiting, unspecified: Secondary | ICD-10-CM

## 2020-01-04 DIAGNOSIS — I609 Nontraumatic subarachnoid hemorrhage, unspecified: Secondary | ICD-10-CM

## 2020-01-04 HISTORY — PX: IR ANGIO VERTEBRAL SEL VERTEBRAL BILAT MOD SED: IMG5369

## 2020-01-04 HISTORY — PX: IR US GUIDE VASC ACCESS RIGHT: IMG2390

## 2020-01-04 HISTORY — PX: IR ANGIO INTRA EXTRACRAN SEL COM CAROTID INNOMINATE BILAT MOD SED: IMG5360

## 2020-01-04 LAB — CSF CULTURE W GRAM STAIN: Culture: NO GROWTH

## 2020-01-04 LAB — SURGICAL PCR SCREEN
MRSA, PCR: NEGATIVE
Staphylococcus aureus: NEGATIVE

## 2020-01-04 LAB — BASIC METABOLIC PANEL
Anion gap: 12 (ref 5–15)
BUN: 11 mg/dL (ref 6–20)
CO2: 24 mmol/L (ref 22–32)
Calcium: 8.9 mg/dL (ref 8.9–10.3)
Chloride: 101 mmol/L (ref 98–111)
Creatinine, Ser: 0.6 mg/dL (ref 0.44–1.00)
GFR calc Af Amer: >60 mL/min (ref >60)
GFR calc non Af Amer: >60 mL/min (ref >60)
Glucose, Bld: 129 mg/dL — ABNORMAL HIGH (ref 70–99)
Potassium: 3.6 mmol/L (ref 3.5–5.1)
Sodium: 137 mmol/L (ref 135–145)

## 2020-01-04 LAB — HEPATIC FUNCTION PANEL
ALT: 19 U/L (ref 0–44)
AST: 14 U/L — ABNORMAL LOW (ref 15–41)
Albumin: 3.5 g/dL (ref 3.5–5.0)
Alkaline Phosphatase: 31 U/L — ABNORMAL LOW (ref 38–126)
Bilirubin, Direct: 0.1 mg/dL (ref 0.0–0.2)
Indirect Bilirubin: 0.7 mg/dL (ref 0.3–0.9)
Total Bilirubin: 0.8 mg/dL (ref 0.3–1.2)
Total Protein: 6.7 g/dL (ref 6.5–8.1)

## 2020-01-04 LAB — C-REACTIVE PROTEIN: CRP: 0.6 mg/dL (ref <1.0)

## 2020-01-04 LAB — MAGNESIUM: Magnesium: 1.9 mg/dL (ref 1.7–2.4)

## 2020-01-04 LAB — LIPASE, BLOOD: Lipase: 19 U/L (ref 11–51)

## 2020-01-04 MED ORDER — NITROGLYCERIN 1 MG/10 ML FOR IR/CATH LAB
INTRA_ARTERIAL | Status: AC
  Filled 2020-01-04: qty 10

## 2020-01-04 MED ORDER — FENTANYL CITRATE (PF) 100 MCG/2ML IJ SOLN
INTRAMUSCULAR | Status: AC | PRN
  Administered 2020-01-04: 25 ug via INTRAVENOUS

## 2020-01-04 MED ORDER — NITROGLYCERIN 1 MG/10 ML FOR IR/CATH LAB
INTRA_ARTERIAL | Status: AC | PRN
  Administered 2020-01-04: 200 ug via INTRA_ARTERIAL

## 2020-01-04 MED ORDER — VERAPAMIL HCL 2.5 MG/ML IV SOLN: INTRA_ARTERIAL | Status: AC | PRN

## 2020-01-04 MED ORDER — FENTANYL CITRATE (PF) 100 MCG/2ML IJ SOLN
INTRAMUSCULAR | Status: AC
  Filled 2020-01-04: qty 2

## 2020-01-04 MED ORDER — LIDOCAINE HCL 1 % IJ SOLN
INTRAMUSCULAR | Status: AC
  Filled 2020-01-04: qty 20

## 2020-01-04 MED ORDER — LIDOCAINE HCL 2 % IJ SOLN
INTRAMUSCULAR | Status: AC | PRN
  Administered 2020-01-04: 5 mL

## 2020-01-04 MED ORDER — ONDANSETRON HCL 4 MG/2ML IJ SOLN: 4.0000 mg | Freq: Four times a day (QID) | INTRAMUSCULAR | Status: DC | PRN

## 2020-01-04 MED ORDER — LACTATED RINGERS IV SOLN: INTRAVENOUS | Status: DC

## 2020-01-04 MED ORDER — VERAPAMIL HCL 2.5 MG/ML IV SOLN
INTRAVENOUS | Status: AC | PRN
  Administered 2020-01-04: 2.5 mg via INTRAVENOUS

## 2020-01-04 MED ORDER — SODIUM CHLORIDE 0.9 % IV SOLN: INTRAVENOUS | Status: AC

## 2020-01-04 MED ORDER — VERAPAMIL HCL 2.5 MG/ML IV SOLN
INTRAVENOUS | Status: AC
  Filled 2020-01-04: qty 2

## 2020-01-04 MED ORDER — IOHEXOL 300 MG/ML  SOLN
150.0000 mL | Freq: Once | INTRAMUSCULAR | Status: AC | PRN
  Administered 2020-01-04: 75 mL via INTRA_ARTERIAL

## 2020-01-04 MED ORDER — MIDAZOLAM HCL 2 MG/2ML IJ SOLN
INTRAMUSCULAR | Status: AC
  Filled 2020-01-04: qty 2

## 2020-01-04 MED ORDER — HEPARIN SODIUM (PORCINE) 1000 UNIT/ML IJ SOLN
INTRAMUSCULAR | Status: AC
  Filled 2020-01-04: qty 1

## 2020-01-04 MED ORDER — PROMETHAZINE HCL 25 MG/ML IJ SOLN
12.5000 mg | Freq: Four times a day (QID) | INTRAMUSCULAR | Status: DC | PRN
  Administered 2020-01-04: 12.5 mg via INTRAVENOUS
  Filled 2020-01-04: qty 1

## 2020-01-04 MED ORDER — MIDAZOLAM HCL 2 MG/2ML IJ SOLN
INTRAMUSCULAR | Status: AC | PRN
  Administered 2020-01-04: 1 mg via INTRAVENOUS

## 2020-01-04 MED ORDER — HEPARIN SODIUM (PORCINE) 1000 UNIT/ML IJ SOLN
INTRAMUSCULAR | Status: AC | PRN
  Administered 2020-01-04: 2000 [IU] via INTRAVENOUS

## 2020-01-04 NOTE — Progress Notes (Signed)
Pt vomited while sitting on toilet using restroom. Green and clear in color. Pt denies feeling nauseous just increased headache. Does not want pain medication at this time. Dr. Reesa Chew at bedside. Ordered more fluids and x-ray of abdomen. No other concerns at this time. [place]

## 2020-01-04 NOTE — Sedation Documentation (Signed)
Per Dr. Estanislado Pandy request, nasal cannula removed and CO2 monitoring stopped.  Patient is maintaining O2 at 100% on room air.  No additional sedation drugs being administered at this time.

## 2020-01-04 NOTE — Progress Notes (Signed)
Pt ambulated to bathroom and vomited small green emesis in shower. Pt feeling very nauseous. VS WNL. Pt now resting back in bed. Dr. Reesa Chew paged and notified. Verbal orders to change Phenergan to IV since pt is NPO for procedure. [place]

## 2020-01-04 NOTE — Progress Notes (Addendum)
Neurology Progress Note   S:// Patient in bed, awake, alert, NAD. Still c/o neck pain. Flexion and extension of neck not stiff, but causes pain. Repeat head CT yesterday suspicious for trace SAH in basilar cisterns. MRI head: Corroborating evidence of trace subarachnoid hemorrhage, but furthermore there appears to be also a trace left superior convexitySubdural Hematoma (series 9, image 11) which is new from previous MRI   Headache is persistent but is waxing and waning.  At the time of this evaluation, reported some improvement from earlier this morning.   O:// Current vital signs: BP 133/89 (BP Location: Left Arm)   Pulse 88   Temp 98.3 F (36.8 C) (Oral)   Resp 16   Ht 5\' 4"  (1.626 m)   Wt 63.4 kg   SpO2 99%   BMI 23.99 kg/m  Vital signs in last 24 hours: Temp:  [98.3 F (36.8 C)-99.2 F (37.3 C)] 98.3 F (36.8 C) (04/20 0839) Pulse Rate:  [74-108] 88 (04/20 0839) Resp:  [12-33] 16 (04/20 0839) BP: (117-135)/(74-94) 133/89 (04/20 0839) SpO2:  [99 %-100 %] 99 % (04/20 0839) Unchanged examination General: Awake alert in no distress HEENT: Palpable tenderness on the neck but no neck stiffness. CVS: Regular rhythm Respiratory: Breathing well saturating normally on room air Neurological exam Awake alert oriented x3 Not aphasic No dysarthria Cranial nerves: Pupil equal round react light, extraocular was intact, visual field full, extraocular movements intact, facial sensation intact, face symmetric, tongue and palate midline. Motor exam: all 4 extremitates anti-gravity Sensory exam: Intact light touch all over without extinction Cerebellar: No ataxia  Medications  Current Facility-Administered Medications:  .  acetaminophen (TYLENOL) tablet 500 mg, 500 mg, Oral, Q6H PRN, Arlan Organ, DO .  baclofen (LIORESAL) tablet 10 mg, 10 mg, Oral, TID, Amin, Ankit Chirag, MD, 10 mg at 01/03/20 2137 .  diphenhydrAMINE (BENADRYL) capsule 25 mg, 25 mg, Oral, Q6H PRN, Amin, Ankit  Chirag, MD, 25 mg at 01/02/20 0844 .  diphenhydrAMINE (BENADRYL) injection 25 mg, 25 mg, Intravenous, Q12H, Vonzella Nipple, NP, 25 mg at 01/04/20 0840 .  HYDROmorphone (DILAUDID) injection 0.5-1 mg, 0.5-1 mg, Intravenous, Q2H PRN, Arlan Organ, DO, 1 mg at 01/03/20 2217 .  lactated ringers infusion, , Intravenous, Continuous, BellYevonne Aline, DO, Last Rate: 50 mL/hr at 01/02/20 2302, New Bag at 01/02/20 2302 .  loratadine (CLARITIN) tablet 10 mg, 10 mg, Oral, Daily, Bell, Thomas N, DO, 10 mg at 01/03/20 1018 .  meclizine (ANTIVERT) tablet 12.5 mg, 12.5 mg, Oral, TID PRN, Arlan Organ, DO, 12.5 mg at 01/03/20 0431 .  oxyCODONE (Oxy IR/ROXICODONE) immediate release tablet 5 mg, 5 mg, Oral, Q4H PRN, Arlan Organ, DO, 5 mg at 01/02/20 1236 .  polyethylene glycol (MIRALAX / GLYCOLAX) packet 17 g, 17 g, Oral, Daily PRN, Bell, Thomas N, DO .  prochlorperazine (COMPAZINE) injection 10 mg, 10 mg, Intravenous, Q6H, Vonzella Nipple, NP, 10 mg at 01/04/20 0546 .  promethazine (PHENERGAN) tablet 12.5 mg, 12.5 mg, Oral, Q6H PRN, Bell, Thomas N, DO .  sodium chloride flush (NS) 0.9 % injection 3 mL, 3 mL, Intravenous, Q12H, Bell, Thomas N, DO, 3 mL at 01/03/20 2219 Labs CBC    Component Value Date/Time   WBC 12.0 (H) 01/02/2020 0449   RBC 4.14 01/02/2020 0449   HGB 13.1 01/02/2020 0449   HGB 12.2 06/12/2018 1420   HCT 38.8 01/02/2020 0449   HCT 35.2 06/12/2018 1420   PLT 300 01/02/2020 0449   PLT  237 06/12/2018 1420   MCV 93.7 01/02/2020 0449   MCV 91 06/12/2018 1420   MCH 31.6 01/02/2020 0449   MCHC 33.8 01/02/2020 0449   RDW 11.9 01/02/2020 0449   RDW 12.4 06/12/2018 1420   LYMPHSABS 2.2 01/01/2020 1106   LYMPHSABS 2.1 06/12/2018 1420   MONOABS 0.3 01/01/2020 1106   EOSABS 0.3 01/01/2020 1106   EOSABS 0.2 06/12/2018 1420   BASOSABS 0.0 01/01/2020 1106   BASOSABS 0.0 06/12/2018 1420    CMP     Component Value Date/Time   NA 137 01/04/2020 0450   NA 138 06/12/2018 1420   K  3.6 01/04/2020 0450   CL 101 01/04/2020 0450   CO2 24 01/04/2020 0450   GLUCOSE 129 (H) 01/04/2020 0450   GLUCOSE 78 02/06/2016 1149   BUN 11 01/04/2020 0450   BUN 13 06/12/2018 1420   CREATININE 0.60 01/04/2020 0450   CREATININE 0.67 11/06/2016 1110   CALCIUM 8.9 01/04/2020 0450   PROT 6.6 01/02/2020 0449   PROT 6.7 06/12/2018 1420   ALBUMIN 3.4 (L) 01/02/2020 0449   ALBUMIN 4.4 06/12/2018 1420   AST 20 01/02/2020 0449   ALT 22 01/02/2020 0449   ALKPHOS 34 (L) 01/02/2020 0449   BILITOT 0.4 01/02/2020 0449   BILITOT 0.4 06/12/2018 1420   GFRNONAA >60 01/04/2020 0450   GFRAA >60 01/04/2020 0450    Imaging I have reviewed images in epic and the results pertinent to this consultation are: CT head-normal CTA-head and neck-no evidence of an aneurysm. MRI brain did not reveal an acute abnormality but on further discussion with the neurologist and neuroradiologist on call, there was some concern for increased flair signal in the interpeduncular cistern and right ambient cistern-although favored to be a CSF pulsation artifact, given the unusual clinical presentation, they recommended acquisition of a 3D FLAIR sequence to be able to limit that artifact and evaluate the basal cisterns more clearly.  Based on 3D FLAIR sequence obtained, subarachnoid hemorrhage confirmed.  Also noted subtle left superior convexity subdural hematoma.  Assessment: 39 year old with abrupt onset of a thunderclap headache on top of mild chronic headache with initial imaging negative but ensuing imaging concerning for trace subarachnoid hemorrhage without an obvious aneurysm. Discussed with neuro interventional radiology-scheduled for diagnostic angiogram today  Impression: Headache-severe, episodic Subarachnoid hemorrhage-no aneurysm identified on CTA  Recommendations: -Continue with migraine cocktail as needed -diagnostic angio today -We will follow   -- Amie Portland, MD [facility] Pager:  [phone] If 7pm to 7am, please call on call as listed on AMION.  Attending Neurohospitalist Addendum Patient seen and examined with APP/Resident. Agree with the history and physical as documented above. Agree with the plan as documented, which I helped formulate. I have independently reviewed the chart, obtained history, review of systems and examined the patient.I have personally reviewed pertinent head/neck/spine imaging (CT/MRI). Please feel free to call with any questions. --- Amie Portland, MD [facility] Pager: [phone]  If 7pm to 7am, please call on call as listed on AMION.

## 2020-01-04 NOTE — Progress Notes (Signed)
PROGRESS NOTE    Sylvia Best  TMH:[id number] DOB: [birth year] DOA: 01/01/2020 PCP: Lanae Boast, FNP   Brief Narrative:  39 year old with history of DM2, asthma, GERD presents to the hospital complaining of pretty severe frontal headache with neck stiffness.  CT of the head negative, LP was overall unremarkable.  MRI of the brain was normal.  Seen by neurology team started on typical headache cocktail.  Repeat MRI showed subarachnoid hemorrhage.  Neuro interventional team planning for cerebral angiogram today   Assessment & Plan:   Principal Problem:   Sudden onset of severe headache Active Problems:   History of diabetes mellitus   Tachycardia   Headache   Nausea & vomiting   Subarachnoid bleed (HCC)  Sudden severe headache mostly left-sided with neck stiffness and intermittent numbness of the fingers Subarachnoid hemorrhage -CT head, LP and MRI brain-negative for acute pathology.  MRI performed on 4/19 showed subarachnoid hemorrhage.  No obvious AV malformation/venous sinus abnormality seen on the CTA and MRI scans -Plans for cerebral angiogram today -Neurology team following -ESR-CRP-normal -Continue Phenergan, magnesium, Benadryl, steroids and fluids -Baclofen as needed  Nausea vomiting, nonbloody -Suspect secondary to ongoing intracranial process.  Will give IV fluid bolus, increase rate of LR at 200 cc/h -Check LFTs, lipase -Abdominal x-ray -Antiemetic as needed  Diet controlled diabetes mellitus type 2 likely supportive care   DVT prophylaxis: SCDs Code Status: Full code Family Communication: Family at bedside Disposition Plan:   Patient From= home  Patient Anticipated D/C place= home  Barriers= plans for cerebral angiogram today, unsafe for discharge  Subjective: Patient is now having nausea and nonbloody vomiting.  She has had about 2-3 episodes.  Still having headaches.  Review of Systems Otherwise negative except as per HPI, including: General: Denies  fever, chills, night sweats or unintended weight loss. Resp: Denies cough, wheezing, shortness of breath. Cardiac: Denies chest pain, palpitations, orthopnea, paroxysmal nocturnal dyspnea. GI: Denies abdominal pain, nausea, vomiting, diarrhea or constipation GU: Denies dysuria, frequency, hesitancy or incontinence MS: Denies muscle aches, joint pain or swelling Neuro: abnormal gait Psych: Denies anxiety, depression, SI/HI/AVH Skin: Denies new rashes or lesions ID: Denies sick contacts, exotic exposures, travel  Examination: Constitutional: Mild distress caused by headaches Respiratory: Clear to auscultation bilaterally Cardiovascular: Normal sinus rhythm, no rubs Abdomen: Abdomen is soft nontender nondistended.  Diminished bowel sounds. Musculoskeletal: No edema noted Skin: No rashes seen Neurologic: CN 2-12 grossly intact.  And nonfocal Psychiatric: Normal judgment and insight. Alert and oriented x 3. Normal mood. Objective: Vitals:   01/04/20 0000 01/04/20 0500 01/04/20 0839 01/04/20 1156  BP: 128/74 (!) 125/91 133/89 131/84  Pulse: 78 74 88 91  Resp: '16 12 16 16  ' Temp: 98.8 F (37.1 C) 98.7 F (37.1 C) 98.3 F (36.8 C) 99 F (37.2 C)  TempSrc: Oral Oral Oral Oral  SpO2: 100% 100% 99% 100%  Weight:      Height:       No intake or output data in the 24 hours ending 01/04/20 1201 Filed Weights   01/02/20 0413  Weight: 63.4 kg     Data Reviewed:   CBC: Recent Labs  Lab 01/01/20 1106 01/02/20 0449  WBC 4.9 12.0*  NEUTROABS 2.0  --   HGB 13.5 13.1  HCT 39.9 38.8  MCV 93.7 93.7  PLT 255 921   Basic Metabolic Panel: Recent Labs  Lab 01/01/20 1106 01/02/20 0449 01/03/20 0427 01/04/20 0450  NA 136 136 139 137  K 3.8 3.6 3.3* 3.6  CL 107 108 107 101  CO2 20* 15* 22 24  GLUCOSE 157* 194* 127* 129*  BUN '8 8 7 11  ' CREATININE 0.62 0.67 0.57 0.60  CALCIUM 8.9 9.0 8.7* 8.9  MG  --   --  1.9 1.9   GFR: Estimated Creatinine Clearance: 81.5 mL/min (by C-G  formula based on SCr of 0.6 mg/dL). Liver Function Tests: Recent Labs  Lab 01/01/20 1106 01/02/20 0449  AST 24 20  ALT 24 22  ALKPHOS 36* 34*  BILITOT 0.5 0.4  PROT 7.1 6.6  ALBUMIN 3.7 3.4*   No results for input(s): LIPASE, AMYLASE in the last 168 hours. No results for input(s): AMMONIA in the last 168 hours. Coagulation Profile: Recent Labs  Lab 01/01/20 1106  INR 1.1   Cardiac Enzymes: No results for input(s): CKTOTAL, CKMB, CKMBINDEX, TROPONINI in the last 168 hours. BNP (last 3 results) No results for input(s): PROBNP in the last 8760 hours. HbA1C: No results for input(s): HGBA1C in the last 72 hours. CBG: Recent Labs  Lab 01/02/20 0617  GLUCAP 147*   Lipid Profile: No results for input(s): CHOL, HDL, LDLCALC, TRIG, CHOLHDL, LDLDIRECT in the last 72 hours. Thyroid Function Tests: No results for input(s): TSH, T4TOTAL, FREET4, T3FREE, THYROIDAB in the last 72 hours. Anemia Panel: No results for input(s): VITAMINB12, FOLATE, FERRITIN, TIBC, IRON, RETICCTPCT in the last 72 hours. Sepsis Labs: No results for input(s): PROCALCITON, LATICACIDVEN in the last 168 hours.  Recent Results (from the past 240 hour(s))  CSF culture     Status: None   Collection Time: 01/01/20  4:58 PM   Specimen: PATH Cytology CSF; Cerebrospinal Fluid  Result Value Ref Range Status   Specimen Description CSF  Final   Special Requests NONE  Final   Gram Stain   Final    CYTOSPIN SMEAR WBC PRESENT, PREDOMINANTLY PMN NO ORGANISMS SEEN    Culture   Final    NO GROWTH 3 DAYS Performed at [facility], [address] [place], [place] [address]    Report Status 01/04/2020 FINAL  Final  SARS CORONAVIRUS 2 (TAT 6-24 HRS) Nasopharyngeal Nasopharyngeal Swab     Status: None   Collection Time: 01/02/20 12:07 AM   Specimen: Nasopharyngeal Swab  Result Value Ref Range Status   SARS Coronavirus 2 NEGATIVE NEGATIVE Final    Comment: (NOTE) SARS-CoV-2 target nucleic acids are NOT  DETECTED. The SARS-CoV-2 RNA is generally detectable in upper and lower respiratory specimens during the acute phase of infection. Negative results do not preclude SARS-CoV-2 infection, do not rule out co-infections with other pathogens, and should not be used as the sole basis for treatment or other patient management decisions. Negative results must be combined with clinical observations, patient history, and epidemiological information. The expected result is Negative. Fact Sheet for Patients: SugarRoll.be Fact Sheet for Healthcare Providers: https://www.woods-mathews.com/ This test is not yet approved or cleared by the [place] FDA and  has been authorized for detection and/or diagnosis of SARS-CoV-2 by FDA under an Emergency Use Authorization (EUA). This EUA will remain  in effect (meaning this test can be used) for the duration of the COVID-19 declaration under Section 56 4(b)(1) of the Act, 21 U.S.C. section 360bbb-3(b)(1), unless the authorization is terminated or revoked sooner. Performed at [facility], [place] [address], [place], [place] [address]   Surgical pcr screen     Status: None   Collection Time: 01/04/20  3:00 AM   Specimen: Nasal Mucosa; Nasal  Swab  Result Value Ref Range Status   MRSA, PCR NEGATIVE NEGATIVE Final   Staphylococcus aureus NEGATIVE NEGATIVE Final    Comment: (NOTE) The Xpert SA Assay (FDA approved for NASAL specimens in patients 61 years of age and older), is one component of a comprehensive surveillance program. It is not intended to diagnose infection nor to guide or monitor treatment. Performed at [facility], [place] [address] [place], [place] [address]          Radiology Studies: CT HEAD WO CONTRAST  Addendum Date: 01/03/2020   ADDENDUM REPORT: 01/03/2020 11:25 ADDENDUM: This case was discussed by telephone with Dr. Amie Portland on 01/03/2020 at 10:54, and furthermore the repeat  brain MRI today seems to demonstrate a trace left superior convexity subdural hematoma which was not present by MRI yesterday (please see that report). Case also was re-discussed with Dr. Malen Gauze at at 11:24 am. Electronically Signed   By: Genevie Ann M.D.   On: 01/03/2020 11:25   Result Date: 01/03/2020 CLINICAL DATA:  39 year old female with abrupt onset of thunderclap headache 12/31/2018. Appearance suspicious for basilar cistern subarachnoid hemorrhage versus artifact on brain MRI yesterday. EXAM: CT HEAD WITHOUT CONTRAST TECHNIQUE: Contiguous axial images were obtained from the base of the skull through the vertex without intravenous contrast. COMPARISON:  Head CT and CTA head and neck 01/01/2020. Brain MRI yesterday. FINDINGS: Brain: New since 01/01/2020 small but suspicious volume of hyperdensity in the interpeduncular cistern (series 3, image 12) and prepontine cistern. See also sagittal image 32. No intraventricular hemorrhage or ventriculomegaly. No other intracranial blood identified. No midline shift, mass effect, or evidence of intracranial mass lesion. Gray-white matter differentiation remains normal. No cortically based acute infarct identified. Vascular: No suspicious intracranial vascular hyperdensity. Skull: Stable and negative. Sinuses/Orbits: There are scattered small sinus fluid levels and bubbly opacity, not significantly changed from 01/01/2020. Tympanic cavities and mastoids remain clear. Other: Visualized orbits and scalp soft tissues are within normal limits. IMPRESSION: Noncontrast CT appearance now suspicious for Trace Subarachnoid Hemorrhage in the basilar cisterns. No intracranial mass effect. No intraventricular hemorrhage. Electronically Signed: By: Genevie Ann M.D. On: 01/03/2020 10:48   MR BRAIN WO CONTRAST  Result Date: 01/03/2020 CLINICAL DATA:  39 year old female with abrupt onset of thunderclap headache 12/31/2018. Appearance suspicious for basilar cistern subarachnoid hemorrhage  versus artifact on brain MRI yesterday. Abnormal follow-up noncontrast head CT just now. EXAM: MRI HEAD WITHOUT CONTRAST TECHNIQUE: Multiplanar, multiecho pulse sequences of the brain and surrounding structures were obtained without intravenous contrast. COMPARISON:  Head CT today reported separately. Brain MRI 01/02/2020. FINDINGS: Brain: Trace but suspicious FLAIR hyperintensity in the interpeduncular cistern, prepontine cisterns. Suspicious appearance in the right prepontine cistern on susceptibility weighted imaging, and T1 weighted imaging also appears abnormally heterogeneous in the interpeduncular cistern (series 5, image 12), as well as layering on the pituitary in the suprasellar cistern. Furthermore, there is evidence of trace subdural hematoma over the left superior convexity on axial FLAIR, SWI, sagittal T1 and coronal T2 (series 9, image 11 and series 5, image 17). This is new from the MRI yesterday. No other extra-axial blood identified. No intraventricular blood products identified. No restricted diffusion to suggest acute infarction. No midline shift, mass effect, evidence of mass lesion, ventriculomegaly. Cervicomedullary junction and pituitary are within normal limits. Parenchymal gray and white matter signal appears stable and within normal limits. No areas of cerebral edema identified. No definite chronic cerebral blood products. Vascular: Major intracranial vascular flow voids appear stable  and within normal limits. Skull and upper cervical spine: Negative visible cervical spine. Visualized bone marrow signal is within normal limits. Sinuses/Orbits: Negative orbits. Paranasal sinus fluid levels again noted. Other: Mastoids remain clear. Visible internal auditory structures appear normal. Scalp and face soft tissues appear negative. IMPRESSION: 1. Corroborating evidence of trace subarachnoid hemorrhage, but furthermore there appears to be also a trace left superior convexity Subdural Hematoma  (series 9, image 11) which is new from the MRI yesterday. 2. This pattern of acute blood is unusual and of unclear etiology. No evidence of acute infarct or cerebral edema, brain parenchyma appears stable and negative. 3. This case was discussed by telephone with Dr. Amie Portland on 01/03/2020 at 10:54. Electronically Signed   By: Genevie Ann M.D.   On: 01/03/2020 11:06        Scheduled Meds: . baclofen  10 mg Oral TID  . diphenhydrAMINE  25 mg Intravenous Q12H  . loratadine  10 mg Oral Daily  . prochlorperazine  10 mg Intravenous Q6H  . sodium chloride flush  3 mL Intravenous Q12H   Continuous Infusions: . lactated ringers 100 mL/hr at 01/04/20 1157     LOS: 2 days   Time spent= 35 mins    Kinzi Frediani Arsenio Loader, MD [facility]  If 7PM-7AM, please contact night-coverage  01/04/2020, 12:01 PM

## 2020-01-04 NOTE — Procedures (Signed)
S/P 4 vessel cerebral arteriogram RT rad approach. Findings. 1.No angioevidence of aneurysm,AVM,DAVF,dissections or AV shunting noted. 2.Venous outflow Rt dominant. 3.No evidence of vasospasm. S.Averill Pons MD

## 2020-01-05 LAB — BASIC METABOLIC PANEL
Anion gap: 11 (ref 5–15)
BUN: 7 mg/dL (ref 6–20)
CO2: 22 mmol/L (ref 22–32)
Calcium: 9.3 mg/dL (ref 8.9–10.3)
Chloride: 101 mmol/L (ref 98–111)
Creatinine, Ser: 0.55 mg/dL (ref 0.44–1.00)
GFR calc Af Amer: >60 mL/min (ref >60)
GFR calc non Af Amer: >60 mL/min (ref >60)
Glucose, Bld: 133 mg/dL — ABNORMAL HIGH (ref 70–99)
Potassium: 3.9 mmol/L (ref 3.5–5.1)
Sodium: 134 mmol/L — ABNORMAL LOW (ref 135–145)

## 2020-01-05 LAB — MAGNESIUM: Magnesium: 1.9 mg/dL (ref 1.7–2.4)

## 2020-01-05 LAB — C-REACTIVE PROTEIN: CRP: 0.6 mg/dL (ref <1.0)

## 2020-01-05 MED ORDER — PROCHLORPERAZINE MALEATE 10 MG PO TABS: 10.0000 mg | ORAL_TABLET | Freq: Four times a day (QID) | ORAL | Status: DC

## 2020-01-05 MED ORDER — OXYCODONE HCL 5 MG PO TABS: 5.0000 mg | ORAL_TABLET | ORAL | 0 refills | Status: DC | PRN

## 2020-01-05 MED ORDER — PROCHLORPERAZINE MALEATE 10 MG PO TABS
10.0000 mg | ORAL_TABLET | Freq: Four times a day (QID) | ORAL | Status: DC | PRN
  Filled 2020-01-05: qty 1

## 2020-01-05 MED ORDER — PROCHLORPERAZINE MALEATE 10 MG PO TABS: 10.0000 mg | ORAL_TABLET | Freq: Four times a day (QID) | ORAL | 0 refills | Status: DC | PRN

## 2020-01-05 MED ORDER — DIPHENHYDRAMINE HCL 25 MG PO CAPS: 25.0000 mg | ORAL_CAPSULE | Freq: Two times a day (BID) | ORAL | Status: DC

## 2020-01-05 NOTE — Discharge Instructions (Signed)
Discharge Instructions  -Symptomatic management of headache - avoid NSAIDs (DO NOT USE ASPIRIN, ADVIL, MOTRIN, ALEVE, or any similar OTC pain medication).  Can use Compazine, tylenol, and low dose oxycodone as needed for severe headache pain.   -Avoid heavy lifting/strenuous activities for at least the next 4 weeks.  -You should be followed up in the neurology clinic in the next 4 to 6 weeks with follow-up imaging in the form of an MRI to ensure hematoma resolution. If the Neurologist has not called you within the next week to arrange this, please call his office as listed below.    Subarachnoid Hemorrhage Subarachnoid hemorrhage is bleeding in the area between the brain and the membrane that covers the brain (subarachnoid space). This bleeding increases the pressure on the brain and decreases blood flow to certain areas of the brain. Subarachnoid hemorrhage is a medical emergency. If this condition is not treated, it may cause permanent brain damage, stroke, or even death. What are the causes? This condition may be caused by:  A burst blood vessel in the brain (ruptured brain aneurysm).  A head injury.  Bleeding from blood vessels that have developed abnormally (arteriovenous malformation).  A bleeding disorder.  Use of blood-thinning medicines (anticoagulants).  Use of certain drugs, such as cocaine. In some cases, the cause is not known. What increases the risk? You are more likely to develop this condition if:  You smoke.  You have high blood pressure (hypertension).  You abuse alcohol.  You are older than age 40.  You are female, especially if you have gone through menopause.  You have a family history of aneurysms.  You have a certain genetic syndrome that results in kidney disease (autosomal dominant polycystic kidney disease) or connective tissue disease. What are the signs or symptoms? Symptoms of this condition include:  A sudden, severe headache. The headache  is often described as the worst headache ever experienced.  Nausea or vomiting, especially when combined with other symptoms such as a headache.  Sudden weakness or numbness of the face, arm, or leg, especially on one side of the body.  Sudden trouble walking or difficulty moving an arm or leg.  Sudden confusion.  Trouble speaking (expressive aphasia) or understanding speech (receptive aphasia).  Difficulty swallowing.  Sudden trouble seeing out of one or both eyes.  Double vision.  Dizziness.  Loss of balance or coordination.  Sensitivity to light.  Stiff neck.  Drowsiness.  Loss of consciousness. How is this diagnosed? This condition is diagnosed based on a physical exam and your symptoms. You may have tests, such as:  CT scan.  MRI.  A procedure to examine the blood vessels in the brain and neck (cerebral angiogram). For this test, a dye is injected into your bloodstream before X-rays are taken to examine blood flow. Dye makes the blood vessels easier to see.  A procedure to remove and examine a small amount of the fluid that surrounds the brain and spinal cord (lumbar puncture).  Blood tests.  An ultrasound to monitor blood flow in the brain (transcranial Doppler). How is this treated? This condition often requires immediate treatment in the hospital to lower the risk of brain damage. Treatment depends on the cause, severity, and location of the bleeding and any damage that it has caused. Treatment goals are to stop bleeding, repair the cause of bleeding, relieve symptoms, and prevent problems. Treatment may include:  Medicines that: ? Reverse the effects of blood thinners, if you were taking blood  thinners before the subarachnoid hemorrhage. ? Lower the blood pressure (antihypertensives). ? Relieve pain (analgesics). ? Relieve nausea or vomiting. ? Prevent seizures.  Surgery to stop bleeding, repair the cause of the bleeding, or remove blood that has  collected. This may involve: ? A procedure that is done from inside the blood vessel, in which the aneurysm is filled with small, platinum coils (endovascular coiling). ? Opening the skull (craniotomy) to reach the aneurysm and put a clip at the base of the aneurysm (surgical clipping).  Surgery to relieve pressure on the brain by placing a tube (external ventricular drain, EVD) in the brain to drain blood.  Physical, occupational, or speech-language therapy to improve any mental (cognitive) and day-to-day functions that are affected by your condition. Other treatment depends on the cause and severity of symptoms, and how long the symptoms have lasted. Actions may be taken to prevent short-term and long-term problems, including lung infection (pneumonia) and blood clots in your legs. Follow these instructions at home: Medicines  Take over-the-counter and prescription medicines only as told by your health care provider.  Do not take any medicines that contain aspirin or NSAIDs unless your health care provider approves. Lifestyle  Do not use any products that contain nicotine or tobacco, such as cigarettes and e-cigarettes. If you need help quitting, ask your health care provider.  Limit alcohol intake to no more than 1 drink a day for nonpregnant women and 2 drinks a day for men. One drink equals 12 oz of beer, 5 oz of wine, or 1 oz of hard liquor. Eating and drinking  Follow instructions from your health care provider about whether eating and drinking are safe for you. You may need tests to make sure that you can swallow safely (swallow studies). Driving  Do not drive until your health care provider approves.  Do not drive or use heavy machinery while taking prescription pain medicine. General instructions  Do physical, occupational, and speech-language therapy as recommended.  Rest and limit activities as directed. Rest helps the brain to heal. Make sure you: ? Get plenty of  sleep. ? Avoid activities that cause physical or mental stress.  Check and write down your blood pressure as told by your health care provider.  Keep all follow-up visits as told by your health care providers. This is important. Contact a health care provider if:  You have a stiff neck.  You have a cough.  You have a fever. Get help right away if:  You have any symptoms of a stroke. "BE FAST" is an easy way to remember the main warning signs of a stroke: ? B - Balance. Signs are dizziness, sudden trouble walking, or loss of balance. ? E - Eyes. Signs are trouble seeing or a sudden change in vision. ? F - Face. Signs are sudden weakness or numbness of the face, or the face or eyelid drooping on one side. ? A - Arms. Signs are weakness or numbness in an arm. This happens suddenly and usually on one side of the body. ? S - Speech. Signs are sudden trouble speaking, slurred speech, or trouble understanding what people say. ? T - Time. Time to call emergency services. Write down what time symptoms started.  You have other signs of a stroke, such as: ? A sudden, severe headache with no known cause. ? Nausea or vomiting. ? Seizure. These symptoms may represent a serious problem that is an emergency. Do not wait to see if the symptoms will  go away. Get medical help right away. Call your local emergency services (911 in the [place] Do not drive yourself to the hospital. Summary  Subarachnoid hemorrhage is bleeding in the area between the brain and the membrane that covers the brain (subarachnoid space).  Subarachnoid hemorrhage is a medical emergency. Treatment may include procedures to stop bleeding or reduce pressure in the skull, and medicines to prevent complications.  Follow instructions from your health care provider about diet restrictions, activity restrictions, and medicines. This information is not intended to replace advice given to you by your health care provider. Make sure you  discuss any questions you have with your health care provider. Document Revised: 02/22/2019 Document Reviewed: 06/12/2017 Elsevier Patient Education  2020 [employer].

## 2020-01-05 NOTE — Plan of Care (Signed)

## 2020-01-05 NOTE — Progress Notes (Signed)
   01/05/20   To Whom it may concern,  Sylvia Best was admitted to [facility] on 01/01/2020  and remained under my care in the hospital through 01/05/2020.  She has been advised that he should not return to work until 02/02/2020 due to medical restrictions on her safe activity level.   Sincerely,  Cherene Altes, MD [facility] Office  [phone]

## 2020-01-05 NOTE — Discharge Summary (Signed)
DISCHARGE SUMMARY  Sylvia Best  MR#: [id number]  DOB:11-Jul-1981  Date of Admission: 01/01/2020 Date of Discharge: 01/05/2020  Attending Physician:Breelyn Icard Hennie Duos, MD  Patient's [id number], Sylvia Maples, FNP  Consults: Neurology   Disposition: D/C home  Follow-up Appts: Follow-up Information    Lanae Boast, FNP Follow up in 2 week(s).   Specialty: Family Medicine Contact information: [place] [address] [phone]           Discharge Diagnoses: Spontaneous subarachnoid hemorrhage (non-aneurysmal) Intractable thunderclap headache  DM2 -diet controlled Asthma GERD  Initial presentation: 39yo with a history of diet controlled DM 2, asthma, and GERD who presented to the hospital with acute onset of severe frontal headache with neck stiffness.  CT scan of the head was unrevealing.  An LP was accomplished and was also unremarkable.  Initial MRI of the brain was also felt to be unremarkable.  She was admitted to the hospital and neurology was consulted.  Hospital Course: The patient was admitted to the acute units.  Neurology was consulted.  As noted her initial CT head was unrevealing.  She underwent a lumbar puncture on 4/17 which was very bloody without xanthochromia.  CT angio of the head and neck on the date of admission was also unrevealing.  Subsequent CT and MRI imaging under direction of neurology was suggestive of a trace subarachnoid hemorrhage in the interpeduncular prepontine cisterns and possible right prepontine cistern as well.  She underwent diagnostic cerebral angiogram which was negative for aneurysm, dural AV fistula, or vascular malformation.  MRI was not felt to be consistent with PRES.  There was no evidence of our CVS.  Ultimately the patient was felt to have experienced a thunderclap headache related to her spontaneous subarachnoid hemorrhage with no clear underlying cause of this.  At neurology's recommendation she was cleared for  discharge 4/21.  She is to use Compazine and very low-dose opiates and of short duration as needed to help with her headaches.  She is to avoid heavy lifting/strenuous activity for 4 weeks.  She is to follow-up in the neurology clinic in 4-6 weeks.  Allergies as of 01/05/2020   No Known Allergies     Medication List    STOP taking these medications   meclizine 12.5 MG tablet Commonly known as: ANTIVERT   naproxen 500 MG tablet Commonly known as: Naprosyn     TAKE these medications   acetaminophen 500 MG tablet Commonly known as: TYLENOL Take 1 tablet (500 mg total) by mouth every 6 (six) hours as needed.   cetirizine 10 MG tablet Commonly known as: ZYRTEC Take 1 tablet (10 mg total) by mouth daily.   fluticasone 50 MCG/ACT nasal spray Commonly known as: FLONASE Place 2 sprays into both nostrils daily.   Nexplanon 68 MG Impl implant Generic drug: etonogestrel 1 each by Subdermal route continuous.   oxyCODONE 5 MG immediate release tablet Commonly known as: Oxy IR/ROXICODONE Take 1 tablet (5 mg total) by mouth every 4 (four) hours as needed for moderate pain.   prochlorperazine 10 MG tablet Commonly known as: COMPAZINE Take 1 tablet (10 mg total) by mouth every 6 (six) hours as needed for nausea or vomiting (or refractory headache).       Day of Discharge BP 119/80 (BP Location: Left Arm)    Pulse 88    Temp 97.6 F (36.4 C) (Oral)    Resp 18    Ht 5\' 4"  (1.626 m)    Wt 63.4 kg  SpO2 100%    BMI 23.99 kg/m   Physical Exam: General: No acute respiratory distress Lungs: Clear to auscultation bilaterally without wheezes or crackles Cardiovascular: Regular rate and rhythm without murmur gallop or rub normal S1 and S2 Abdomen: Nontender, nondistended, soft, bowel sounds positive, no rebound, no ascites, no appreciable mass Extremities: No significant cyanosis, clubbing, or edema bilateral lower extremities  Basic Metabolic Panel: Recent Labs  Lab 01/01/20 1106  01/02/20 0449 01/03/20 0427 01/04/20 0450 01/05/20 0451  NA 136 136 139 137 134*  K 3.8 3.6 3.3* 3.6 3.9  CL 107 108 107 101 101  CO2 20* 15* 22 24 22   GLUCOSE 157* 194* 127* 129* 133*  BUN 8 8 7 11 7   CREATININE 0.62 0.67 0.57 0.60 0.55  CALCIUM 8.9 9.0 8.7* 8.9 9.3  MG  --   --  1.9 1.9 1.9    Liver Function Tests: Recent Labs  Lab 01/01/20 1106 01/02/20 0449 01/04/20 1158  AST 24 20 14*  ALT 24 22 19   ALKPHOS 36* 34* 31*  BILITOT 0.5 0.4 0.8  PROT 7.1 6.6 6.7  ALBUMIN 3.7 3.4* 3.5   Recent Labs  Lab 01/04/20 1158  LIPASE 19    Coags: Recent Labs  Lab 01/01/20 1106  INR 1.1    CBC: Recent Labs  Lab 01/01/20 1106 01/02/20 0449  WBC 4.9 12.0*  NEUTROABS 2.0  --   HGB 13.5 13.1  HCT 39.9 38.8  MCV 93.7 93.7  PLT 255 300     CBG: Recent Labs  Lab 01/02/20 0617  GLUCAP 147*    Recent Results (from the past 240 hour(s))  CSF culture     Status: None   Collection Time: 01/01/20  4:58 PM   Specimen: PATH Cytology CSF; Cerebrospinal Fluid  Result Value Ref Range Status   Specimen Description CSF  Final   Special Requests NONE  Final   Gram Stain   Final    CYTOSPIN SMEAR WBC PRESENT, PREDOMINANTLY PMN NO ORGANISMS SEEN    Culture   Final    NO GROWTH 3 DAYS Performed at [facility], [place] [address] [place], [place] [address]    Report Status 01/04/2020 FINAL  Final  SARS CORONAVIRUS 2 (TAT 6-24 HRS) Nasopharyngeal Nasopharyngeal Swab     Status: None   Collection Time: 01/02/20 12:07 AM   Specimen: Nasopharyngeal Swab  Result Value Ref Range Status   SARS Coronavirus 2 NEGATIVE NEGATIVE Final    Comment: (NOTE) SARS-CoV-2 target nucleic acids are NOT DETECTED. The SARS-CoV-2 RNA is generally detectable in upper and lower respiratory specimens during the acute phase of infection. Negative results do not preclude SARS-CoV-2 infection, do not rule out co-infections with other pathogens, and should not be used as the sole basis  for treatment or other patient management decisions. Negative results must be combined with clinical observations, patient history, and epidemiological information. The expected result is Negative. Fact Sheet for Patients: SugarRoll.be Fact Sheet for Healthcare Providers: https://www.woods-mathews.com/ This test is not yet approved or cleared by the [place] FDA and  has been authorized for detection and/or diagnosis of SARS-CoV-2 by FDA under an Emergency Use Authorization (EUA). This EUA will remain  in effect (meaning this test can be used) for the duration of the COVID-19 declaration under Section 56 4(b)(1) of the Act, 21 U.S.C. section 360bbb-3(b)(1), unless the authorization is terminated or revoked sooner. Performed at [facility], [place] [address], [place], [place] [address]  Surgical pcr screen     Status: None   Collection Time: 01/04/20  3:00 AM   Specimen: Nasal Mucosa; Nasal Swab  Result Value Ref Range Status   MRSA, PCR NEGATIVE NEGATIVE Final   Staphylococcus aureus NEGATIVE NEGATIVE Final    Comment: (NOTE) The Xpert SA Assay (FDA approved for NASAL specimens in patients 1 years of age and older), is one component of a comprehensive surveillance program. It is not intended to diagnose infection nor to guide or monitor treatment. Performed at [facility], [place] [address] [place], [place] [address]       Time spent in discharge (includes decision making & examination of pt): 35 minutes  01/05/2020, 11:27 AM   Cherene Altes, MD [facility] Office  [phone]

## 2020-01-05 NOTE — TOC Transition Note (Signed)
Transition of Care [facility]) - CM/SW Discharge Note   Patient Details  Name: Sylvia Best MRN: [id number] Date of Birth: [birth year]  [facility] of [facility]) CM/SW Contact:  Pollie Friar, RN Phone Number: 01/05/2020, 12:41 PM   Clinical Narrative:    Pt discharging home with self care. Pt has insurance, hospital f/u and transportation home.    Final next level of care: Home/Self Care Barriers to Discharge: No Barriers Identified   Patient Goals and CMS Choice        Discharge Placement                       Discharge Plan and Services                                     Social Determinants of Health (SDOH) Interventions     Readmission Risk Interventions No flowsheet data found.

## 2020-01-05 NOTE — Progress Notes (Signed)
Neurology Progress Note   S:// Seen and examined this morning.  Patient's husband is in the room today as well. Reports considerable improvement in the headache compared to yesterday.  O:// Current vital signs: BP 125/87 (BP Location: Left Arm)   Pulse 84   Temp 99.1 F (37.3 C) (Oral)   Resp 18   Ht 5\' 4"  (1.626 m)   Wt 63.4 kg   SpO2 100%   BMI 23.99 kg/m  Vital signs in last 24 hours: Temp:  [98.5 F (36.9 C)-99.6 F (37.6 C)] 99.1 F (37.3 C) (04/21 0400) Pulse Rate:  [77-123] 84 (04/21 0400) Resp:  [12-23] 18 (04/21 0400) BP: (95-139)/(58-99) 125/87 (04/21 0400) SpO2:  [97 %-100 %] 100 % (04/21 0400) Awake alert in no distress Normocephalic atraumatic Lungs clear Regular rate rhythm Nondistended nontender abdomen Warm well perfused extremities Neurologic exam Awake alert oriented x3 No aphasia No dysarthria Cranial nerves II to XII grossly intact Motor exam with symmetric 5/5 strength all over Sensory exam intact light touch all over Coordination exam shows no dysmetria Gait testing was deferred at this time for patient comfort.   Medications  Current Facility-Administered Medications:  .  acetaminophen (TYLENOL) tablet 500 mg, 500 mg, Oral, Q6H PRN, Arlan Organ, DO .  baclofen (LIORESAL) tablet 10 mg, 10 mg, Oral, TID, Amin, Ankit Chirag, MD, 10 mg at 01/04/20 2330 .  diphenhydrAMINE (BENADRYL) capsule 25 mg, 25 mg, Oral, Q6H PRN, Amin, Ankit Chirag, MD, 25 mg at 01/02/20 0844 .  diphenhydrAMINE (BENADRYL) injection 25 mg, 25 mg, Intravenous, Q12H, Vonzella Nipple, NP, 25 mg at 01/05/20 0757 .  HYDROmorphone (DILAUDID) injection 0.5-1 mg, 0.5-1 mg, Intravenous, Q2H PRN, Darien Ramus N, DO, 1 mg at 01/04/20 1427 .  loratadine (CLARITIN) tablet 10 mg, 10 mg, Oral, Daily, Bell, Thomas N, DO, 10 mg at 01/04/20 1017 .  meclizine (ANTIVERT) tablet 12.5 mg, 12.5 mg, Oral, TID PRN, Arlan Organ, DO, 12.5 mg at 01/03/20 0431 .  ondansetron (ZOFRAN)  injection 4 mg, 4 mg, Intravenous, Q6H PRN, Amin, Ankit Chirag, MD .  oxyCODONE (Oxy IR/ROXICODONE) immediate release tablet 5 mg, 5 mg, Oral, Q4H PRN, Arlan Organ, DO, 5 mg at 01/02/20 1236 .  polyethylene glycol (MIRALAX / GLYCOLAX) packet 17 g, 17 g, Oral, Daily PRN, Bell, Thomas N, DO .  prochlorperazine (COMPAZINE) injection 10 mg, 10 mg, Intravenous, Q6H, Vonzella Nipple, NP, 10 mg at 01/05/20 0537 .  promethazine (PHENERGAN) injection 12.5 mg, 12.5 mg, Intravenous, Q6H PRN, Amin, Ankit Chirag, MD, 12.5 mg at 01/04/20 2032 .  promethazine (PHENERGAN) tablet 12.5 mg, 12.5 mg, Oral, Q6H PRN, Darien Ramus N, DO .  sodium chloride flush (NS) 0.9 % injection 3 mL, 3 mL, Intravenous, Q12H, BellYevonne Aline, DO, 3 mL at 01/04/20 2330 Labs CBC    Component Value Date/Time   WBC 12.0 (H) 01/02/2020 0449   RBC 4.14 01/02/2020 0449   HGB 13.1 01/02/2020 0449   HGB 12.2 06/12/2018 1420   HCT 38.8 01/02/2020 0449   HCT 35.2 06/12/2018 1420   PLT 300 01/02/2020 0449   PLT 237 06/12/2018 1420   MCV 93.7 01/02/2020 0449   MCV 91 06/12/2018 1420   MCH 31.6 01/02/2020 0449   MCHC 33.8 01/02/2020 0449   RDW 11.9 01/02/2020 0449   RDW 12.4 06/12/2018 1420   LYMPHSABS 2.2 01/01/2020 1106   LYMPHSABS 2.1 06/12/2018 1420   MONOABS 0.3 01/01/2020 1106   EOSABS 0.3 01/01/2020 1106  EOSABS 0.2 06/12/2018 1420   BASOSABS 0.0 01/01/2020 1106   BASOSABS 0.0 06/12/2018 1420    CMP     Component Value Date/Time   NA 134 (L) 01/05/2020 0451   NA 138 06/12/2018 1420   K 3.9 01/05/2020 0451   CL 101 01/05/2020 0451   CO2 22 01/05/2020 0451   GLUCOSE 133 (H) 01/05/2020 0451   GLUCOSE 78 02/06/2016 1149   BUN 7 01/05/2020 0451   BUN 13 06/12/2018 1420   CREATININE 0.55 01/05/2020 0451   CREATININE 0.67 11/06/2016 1110   CALCIUM 9.3 01/05/2020 0451   PROT 6.7 01/04/2020 1158   PROT 6.7 06/12/2018 1420   ALBUMIN 3.5 01/04/2020 1158   ALBUMIN 4.4 06/12/2018 1420   AST 14 (L) 01/04/2020 1158    ALT 19 01/04/2020 1158   ALKPHOS 31 (L) 01/04/2020 1158   BILITOT 0.8 01/04/2020 1158   BILITOT 0.4 06/12/2018 1420   GFRNONAA >60 01/05/2020 0451   GFRAA >60 01/05/2020 0451    Imaging I have reviewed images in epic and the results pertinent to this consultation are: MRI brain and CT brain shows trace subarachnoid hemorrhage in the interpedicular prepontine cisterns and possible right prepontine cistern as well.  CTA head and neck unremarkable for any aneurysm or vascular malformation.  Diagnostic cerebral angiogram unremarkable for underlying vascular anomaly or aneurysm.  Assessment: 39 year old with sudden onset of a thunderclap headache, with spinal tap which was very bloody without xanthochromia and initial imaging negative for subarachnoid hemorrhage but ensuing imaging with CT and MRI suggestive of trace subarachnoid hemorrhage in the interpeduncular prepontine cisterns and possible right prepontine cistern as well. She underwent diagnostic cerebral angiogram which was negative for any aneurysm, dural AV fistula or vascular malformation. MRI not suggestive of posterior reversible encephalopathy syndrome.  There is no evidence of RCVS.  Impression: Spontaneous subarachnoid hemorrhage-not aneurysmal.  Recommendations: -Symptomatic management of headache-avoid NSAIDs.  Can use Compazine, very low-dose opiates for short duration as well. -I do not see need for nimodipine.  There was no evidence of vasospasm on CTA or diagnostic cerebral angiogram. -I would recommend avoiding heavy lifting/strenuous activities for at least the next 2 to 4 weeks. 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 -She should be followed up in the neurology clinic in the next 4 to 6 weeks with follow-up imaging in the form of an MRI to ensure hematoma resolution  Discussed my plan in detail with the patient and husband.  Will relay  my plan to the primary team.  -- Amie Portland, MD [facility] Pager: [phone] If 7pm to 7am, please call on call as listed on AMION.

## 2020-01-05 NOTE — Progress Notes (Signed)
Pt for discharge home today.  Still having headhache despite pain medicine.  Iv removed earlier,  Telemetry discontinued and prescription and the work note given to pt.  Pt requested to stay till 7 to 8pm this evening for her husband to take her home.

## 2020-01-17 ENCOUNTER — Ambulatory Visit: Payer: BLUE CROSS/BLUE SHIELD | Admitting: Neurology

## 2020-01-17 ENCOUNTER — Encounter: Payer: Self-pay | Admitting: Neurology

## 2020-01-17 ENCOUNTER — Other Ambulatory Visit: Payer: Self-pay

## 2020-01-17 VITALS — BP 106/74 | HR 96 | Temp 97.6°F | Ht 64.0 in | Wt 131.0 lb

## 2020-01-17 DIAGNOSIS — Z8679 Personal history of other diseases of the circulatory system: Secondary | ICD-10-CM

## 2020-01-17 DIAGNOSIS — R519 Headache, unspecified: Secondary | ICD-10-CM

## 2020-01-17 MED ORDER — AMITRIPTYLINE HCL 25 MG PO TABS: ORAL_TABLET | ORAL | 3 refills | Status: DC

## 2020-01-17 NOTE — Patient Instructions (Addendum)
Your neurological exam is normal thankfully.  I am glad to hear that you are feeling better.  Nevertheless, since you still have nearly daily headaches, we can try you on medication for headache prevention, called Elavil (generic name: amitriptyline) 25 mg: Take half a pill daily at bedtime for one week, then one pill daily at bedtime for one week, then one and a half pills daily at bedtime for one week, then 2 pills daily at bedtime thereafter. Common side effects reported are: mouth dryness, drowsiness, confusion, dizziness.   Please note, that this medication is not considered safe during pregnancy or breast-feeding.  I would like for you to make an appointment to see your eye doctor.  If you need to have a referral, I would be happy to put a referral in.  Here is a list of some of the optometrist and ophthalmologist we referred to typically: [facility], [facility], [facility], [facility], [facility].  Please monitor the swelling and redness of your right forearm, it looks fairly good today, if it gets swollen or red, please get it checked out immediately, you may have to go to the emergency room.  I can also make a referral to a vascular surgeon if the need arises.

## 2020-01-17 NOTE — Progress Notes (Signed)
Subjective:    Patient ID: Sylvia Best is a 39 y.o. female.  HPI     Sylvia Age, MD, PhD [facility] [address], [address] [place], [place] [address]  I saw patient, Sylvia Best, as a referral from the hospital for evaluation of her headaches and SAH. The patient is accompanied by interpreter, Sylvia Best, today. She is a 39 year old right-handed woman with an underlying medical history of asthma, reflux disease, diabetes, who was recently admitted to the hospital with severe headache.  She was admitted on 01/01/2020 and discharged on 01/05/2020, she was diagnosed with spontaneous subarachnoid hemorrhage and thunderclap headache.  Upon discharge, she was advised not to do any heavy lifting for 4 weeks.  She reports that she is going back to work in about 2 weeks.  She is asking about the ability to fast during the month of Ramadan.  I advised her that this is up to her, a personal decision.  From the neurological standpoint, she has no one-sided weakness or numbness or tingling.  She had left facial numbness before.  She still has recurrent headaches which are milder, certainly not as severe as before, she typically only takes Tylenol extra strength, 2 pills/day on average.  She tries to hydrate well with water.  She has no one-sided or binocular visual symptoms such as blurry vision or double vision.  She had an eye examination about 2 years ago.  She reports a headache that starts at the base of her head and radiates forward.  She denies any nausea at this time but had nausea before.  She had a prescription upon discharge of Compazine which she did not use and oxycodone which she has not used in about a week.  She had multiple imaging tests, and I reviewed the results:  [place] wo contrast on 01/01/20 showed:   IMPRESSION: Mild bilateral ethmoid and maxillary sinusitis. No acute intracranial abnormality seen.   She had LP on 01/01/20: IMPRESSION: Successful fluoroscopic  guided lumbar puncture with 12 cc of reddish CSF (suggesting bloody tap) obtained and sent to the lab.   CTA head and neck w/wo contrast on 01/01/20 showed: IMPRESSION: 1. Negative CTA of the neck. 2. Normal variant CTA Circle of Willis without significant proximal stenosis, aneurysm, or branch vessel occlusion.   MRI br wo contrast on 01/02/20 showed: IMPRESSION: Normal brain MRI.  Her Past Medical History Is Significant For: Past Medical History:  Diagnosis Date  . Asthma   . Diabetes mellitus    gestational  . GERD (gastroesophageal reflux disease)    no meds  . Gestational diabetes     Her Past Surgical History Is Significant For: Past Surgical History:  Procedure Laterality Date  . CESAREAN SECTION  04/26/2011   Procedure: CESAREAN SECTION;  Surgeon: Donnamae Jude, MD;  Location: [facility];  Service: Gynecology;  Laterality: N/A;  . CESAREAN SECTION N/A 04/05/2016   Procedure: CESAREAN SECTION;  Surgeon: Osborne Oman, MD;  Location: [facility];  Service: Obstetrics;  Laterality: N/A;  . DILATION AND CURETTAGE OF UTERUS    . IR ANGIO INTRA EXTRACRAN SEL COM CAROTID INNOMINATE BILAT MOD SED  01/04/2020  . IR ANGIO VERTEBRAL SEL VERTEBRAL BILAT MOD SED  01/04/2020  . IR US GUIDE VASC ACCESS RIGHT  01/04/2020  . TONSILLECTOMY     Best 39    Her Family History Is Significant For: Family History  Problem Relation Best of Onset  . Hypertension Mother   .  Heart disease Father   . Diabetes Father   . Diabetes Paternal Grandfather     Her Social History Is Significant For: Social History   Socioeconomic History  . Marital status: Married    Spouse name: Not on file  . Number of children: Not on file  . Years of education: Not on file  . Highest education level: Associate degree: occupational, Hotel manager, or vocational program  Occupational History  . Not on file  Tobacco Use  . Smoking status: Never Smoker  . Smokeless tobacco: Never Used  Substance and Sexual  Activity  . Alcohol use: No  . Drug use: No  . Sexual activity: Yes    Birth control/protection: Pill  Other Topics Concern  . Not on file  Social History Narrative  . Not on file   Social Determinants of Health   Financial Resource Strain:   . Difficulty of Paying Living Expenses:   Food Insecurity:   . Worried About Charity fundraiser in the Last Year:   . Arboriculturist in the Last Year:   Transportation Needs: No Transportation Needs  . Lack of Transportation (Medical): No  . Lack of Transportation (Non-Medical): No  Physical Activity:   . Days of Exercise per Week:   . Minutes of Exercise per Session:   Stress:   . Feeling of Stress :   Social Connections:   . Frequency of Communication with Friends and Family:   . Frequency of Social Gatherings with Friends and Family:   . Attends Religious Services:   . Active Member of Clubs or Organizations:   . Attends Archivist Meetings:   Marland Kitchen Marital Status:     Her Allergies Are:  No Known Allergies:   Her Current Medications Are:  Outpatient Encounter Medications as of 01/17/2020  Medication Sig  . acetaminophen (TYLENOL) 500 MG tablet Take 1 tablet (500 mg total) by mouth every 6 (six) hours as needed.  . etonogestrel (NEXPLANON) 68 MG IMPL implant 1 each by Subdermal route continuous.   . fluticasone (FLONASE) 50 MCG/ACT nasal spray Place into both nostrils daily.  . OXYCODONE-ACETAMINOPHEN PO Take by mouth.  . prochlorperazine (COMPAZINE) 5 MG tablet Take 5 mg by mouth every 6 (six) hours as needed for nausea or vomiting.  . [DISCONTINUED] cetirizine (ZYRTEC) 10 MG tablet Take 1 tablet (10 mg total) by mouth daily. (Patient not taking: Reported on 11/23/2018)  . [DISCONTINUED] fluticasone (FLONASE) 50 MCG/ACT nasal spray Place 2 sprays into both nostrils daily. (Patient not taking: Reported on 01/17/2020)  . [DISCONTINUED] oxyCODONE (OXY IR/ROXICODONE) 5 MG immediate release tablet Take 1 tablet (5 mg total) by  mouth every 4 (four) hours as needed for moderate pain.  . [DISCONTINUED] prochlorperazine (COMPAZINE) 10 MG tablet Take 1 tablet (10 mg total) by mouth every 6 (six) hours as needed for nausea or vomiting (or refractory headache).   No facility-administered encounter medications on file as of 01/17/2020.  :   Review of Systems:  Out of a complete 14 point review of systems, all are reviewed and negative with the exception of these symptoms as listed below:  Review of Systems  Neurological:       Here for consultation for h/a. Worsening over the last two weeks.     Objective:  Neurological Exam  Physical Exam Physical Examination:   Vitals:   01/17/20 1349  BP: 106/74  Pulse: 96  Temp: 97.6 F (36.4 C)  SpO2: 96%  General Examination: The patient is a very pleasant 39 y.o. female in no acute distress. She appears well-developed and well-nourished and well groomed.   HEENT: Normocephalic, atraumatic, pupils are equal, round and reactive to light and accommodation. Not significantly photophobic. Funduscopic exam is normal with sharp disc margins noted. Extraocular tracking is good without limitation to gaze excursion or nystagmus noted. Normal smooth pursuit is noted. Hearing is grossly intact. Face is symmetric with normal facial animation and normal facial sensation. Speech is clear with no dysarthria noted. There is no hypophonia. There is no lip, neck/head, jaw or voice tremor. Neck is supple with full range of passive and active motion. There are no carotid bruits on auscultation. Oropharynx exam reveals: no sigfinf. mouth dryness, good dental hygien. Tongue protrudes centrally and palate elevates symmetrically.   Chest: Clear to auscultation without wheezing, rhonchi or crackles noted.  Heart: S1+S2+0, regular and normal without murmurs, rubs or gallops noted.   Abdomen: Soft, non-tender and non-distended with normal bowel sounds appreciated on auscultation.  Extremities:  There is no pitting edema in the distal lower extremities bilaterally. Pedal pulses are intact.  She reports some swelling and discomfort in the right forearm where she had her angiogram done.  She has slight blotchy redness, no swelling, no induration noted.  Skin: Warm and dry without trophic changes noted.  Musculoskeletal: exam reveals no obvious joint deformities, tenderness or joint swelling or erythema.   Neurologically:  Mental status: The patient is awake, alert and oriented in all 4 spheres. Her immediate and remote memory, attention, language skills and fund of knowledge are appropriate. There is no evidence of aphasia, agnosia, apraxia or anomia. Speech is clear with normal prosody and enunciation. Thought process is linear. Mood is normal and affect is normal.  Cranial nerves II - XII are as described above under HEENT exam. In addition: shoulder shrug is normal with equal shoulder height noted. Motor exam: Normal bulk, strength and tone is noted. There is no drift, tremor or rebound. Romberg is negative. Reflexes are 2+ throughout. Babinski: Toes are flexor bilaterally. Fine motor skills and coordination: intact with normal finger taps, normal hand movements, normal rapid alternating patting, normal foot taps and normal foot agility.  Cerebellar testing: No dysmetria or intention tremor on finger to nose testing. Heel to shin is unremarkable bilaterally. There is no truncal or gait ataxia.  Sensory exam: intact to light touch, vibration, temperature sense in the upper and lower extremities.  Gait, station and balance: She stands easily. No veering to one side is noted. No leaning to one side is noted. Posture is Best-appropriate and stance is narrow based. Gait shows normal stride length and normal pace. No problems turning are noted. Tandem walk is unremarkable.    Assessment and Plan:   In summary, Eather Gundry is a very pleasant 39 y.o.-year old female with an underlying medical  history of asthma, reflux disease, diabetes, who presents as a referral from the hospital from a recent admission in April for severe headache and spontaneous subarachnoid hemorrhage.  She has had multiple imaging tests during her hospital stay and lumbar puncture testing.  She did not have any evidence of meningitis or encephalitis.  Neurological exam now is nonfocal and she feels better thankfully.  She has no nuchal rigidity.  She feels that her headache is better and nausea has resolved.  She takes Tylenol as needed but has a nearly daily headache which starts at the base of her head and radiates forward.  She is advised to start daily prevention with amitriptyline with gradual titration.  We talked about expectations and potential side effects including sedation, mouth dryness and blurry vision.  She has no obvious other related symptoms but has not had an eye exam in over 2 years.  She is encouraged to schedule an eye exam with an optometrist or ophthalmologist of her choosing.  She declined a referral today but would like to have a list of doctors that she can call.  She was given several names for optometrist and ophthalmologist offices in this area.  She is advised to follow all discharge recommendations from her hospitalization and from my end of things she is advised to start amitriptyline.  We will also do a follow-up brain MRI without contrast to compare with findings from last month.  She is advised to monitor for swelling and redness in the right forearm, she has no obvious signs of inflammation or induration currently.  If there is any sudden pain and swelling, she may have to go to the emergency room, she is advised of this.  She is advised to follow-up in our office routinely in 3 months to see one of our nurse practitioners.  We will call in the interim with her MRI results.  If need be, we can consult with neurosurgery if there are any acute changes on the brain scan.  I answered all her  questions today and the patient was in agreement with the plan.  Sylvia Age, MD, PhD

## 2020-01-20 ENCOUNTER — Telehealth: Payer: Self-pay | Admitting: Neurology

## 2020-01-20 NOTE — Telephone Encounter (Signed)
[employer] Auth: [id number] (exp. 01/20/20 to 07/17/20) order faxed to [facility] bc that is who is in her network they will reach out to pt to schedule. Ph # [phone] & fax # [phone].

## 2020-01-25 ENCOUNTER — Ambulatory Visit: Payer: BLUE CROSS/BLUE SHIELD | Admitting: Adult Health Nurse Practitioner

## 2020-02-17 ENCOUNTER — Telehealth: Payer: Self-pay | Admitting: Neurology

## 2020-02-17 NOTE — Telephone Encounter (Signed)
I received patient's brain MRI report.  She had a brain MRI without contrast at outpatient imaging in [place] through [facility].  Exam date 01/28/2020: Conclusion: No acute intracranial abnormality.  Inflammatory paranasal sinus mucosal thickening.  Please call patient and advise her that her recent brain MRI did not show any acute findings or any residual evidence of bleeding.  She did have chronic sinus changes, left more than right.

## 2020-02-21 NOTE — Telephone Encounter (Signed)
I reached out to the pt and spoke with her husband (ok per dpr) and advised of the results. He verbalized understanding and will call back if the pt has any questions after he discusses with her.

## 2020-05-08 ENCOUNTER — Ambulatory Visit: Payer: BLUE CROSS/BLUE SHIELD | Admitting: Family Medicine

## 2020-05-08 ENCOUNTER — Encounter: Payer: Self-pay | Admitting: Family Medicine

## 2020-05-08 NOTE — Progress Notes (Deleted)
PATIENT: Sylvia Best DOB: [birth year]  REASON FOR VISIT: follow up HISTORY FROM: patient  No chief complaint on file.    HISTORY OF PRESENT ILLNESS: Today 05/08/20 Sylvia Best is a 39 y.o. female here today for follow up s/p SAH with recurrent headaches. She was started on amitriptyline 25mg  at bedtime.   She was seen by Dr Everette Rank, [facility], for a second opinion. No changes were made in care plan and she was advised to consider PT for suspected muscular involvement.   HISTORY: (copied from Dr Guadelupe Sabin note on 01/17/2020)  I saw patient, Sylvia Best, as a referral from the hospital for evaluation of her headaches and SAH. The patient is accompanied by interpreter, Judson Roch, today. She is a 39 year old right-handed woman with an underlying medical history of asthma, reflux disease, diabetes, who was recently admitted to the hospital with severe headache.  She was admitted on 01/01/2020 and discharged on 01/05/2020, she was diagnosed with spontaneous subarachnoid hemorrhage and thunderclap headache.  Upon discharge, she was advised not to do any heavy lifting for 4 weeks.  She reports that she is going back to work in about 2 weeks.  She is asking about the ability to fast during the month of Ramadan.  I advised her that this is up to her, a personal decision.  From the neurological standpoint, she has no one-sided weakness or numbness or tingling.  She had left facial numbness before.  She still has recurrent headaches which are milder, certainly not as severe as before, she typically only takes Tylenol extra strength, 2 pills/day on average.  She tries to hydrate well with water.  She has no one-sided or binocular visual symptoms such as blurry vision or double vision.  She had an eye examination about 2 years ago.  She reports a headache that starts at the base of her head and radiates forward.  She denies any nausea at this time but had nausea before.  She had a prescription upon  discharge of Compazine which she did not use and oxycodone which she has not used in about a week.  She had multiple imaging tests, and I reviewed the results:  [place] wo contrast on 01/01/20 showed:  IMPRESSION: Mild bilateral ethmoid and maxillary sinusitis. No acute intracranial abnormality seen.  She had LP on 01/01/20: IMPRESSION: Successful fluoroscopic guided lumbar puncture with 12 cc of reddish CSF (suggesting bloody tap) obtained and sent to the lab.  CTA head and neck w/wo contrast on 01/01/20 showed: IMPRESSION: 1. Negative CTA of the neck. 2. Normal variant CTA Circle of Willis without significant proximal stenosis, aneurysm, or branch vessel occlusion.  MRI br wo contrast on 01/02/20 showed: IMPRESSION: Normal brain MRI.    REVIEW OF SYSTEMS: Out of a complete 14 system review of symptoms, the patient complains only of the following symptoms, and all other reviewed systems are negative.  ALLERGIES: No Known Allergies  HOME MEDICATIONS: Outpatient Medications Prior to Visit  Medication Sig Dispense Refill  . acetaminophen (TYLENOL) 500 MG tablet Take 1 tablet (500 mg total) by mouth every 6 (six) hours as needed. 30 tablet 0  . amitriptyline (ELAVIL) 25 MG tablet 1/2 pill each bedtime x 1 week, then 1 pill nightly x 1 week, then 1 1/2 pills nightly x 1 week, then 2 pills nightly thereafter. 60 tablet 3  . etonogestrel (NEXPLANON) 68 MG IMPL implant 1 each by Subdermal route continuous.     . fluticasone (FLONASE) 50 MCG/ACT nasal spray  Place into both nostrils daily.    . OXYCODONE-ACETAMINOPHEN PO Take by mouth.    . prochlorperazine (COMPAZINE) 5 MG tablet Take 5 mg by mouth every 6 (six) hours as needed for nausea or vomiting.     No facility-administered medications prior to visit.    PAST MEDICAL HISTORY: Past Medical History:  Diagnosis Date  . Asthma   . Diabetes mellitus    gestational  . GERD (gastroesophageal reflux disease)    no meds  .  Gestational diabetes     PAST SURGICAL HISTORY: Past Surgical History:  Procedure Laterality Date  . CESAREAN SECTION  04/26/2011   Procedure: CESAREAN SECTION;  Surgeon: Donnamae Jude, MD;  Location: [facility];  Service: Gynecology;  Laterality: N/A;  . CESAREAN SECTION N/A 04/05/2016   Procedure: CESAREAN SECTION;  Surgeon: Osborne Oman, MD;  Location: [facility];  Service: Obstetrics;  Laterality: N/A;  . DILATION AND CURETTAGE OF UTERUS    . IR ANGIO INTRA EXTRACRAN SEL COM CAROTID INNOMINATE BILAT MOD SED  01/04/2020  . IR ANGIO VERTEBRAL SEL VERTEBRAL BILAT MOD SED  01/04/2020  . IR US GUIDE VASC ACCESS RIGHT  01/04/2020  . TONSILLECTOMY     age 39    FAMILY HISTORY: Family History  Problem Relation Age of Onset  . Hypertension Mother   . Heart disease Father   . Diabetes Father   . Diabetes Paternal Grandfather     SOCIAL HISTORY: Social History   Socioeconomic History  . Marital status: Married    Spouse name: Not on file  . Number of children: Not on file  . Years of education: Not on file  . Highest education level: Associate degree: occupational, Hotel manager, or vocational program  Occupational History  . Not on file  Tobacco Use  . Smoking status: Never Smoker  . Smokeless tobacco: Never Used  Vaping Use  . Vaping Use: Never used  Substance and Sexual Activity  . Alcohol use: No  . Drug use: No  . Sexual activity: Yes    Birth control/protection: Pill  Other Topics Concern  . Not on file  Social History Narrative  . Not on file   Social Determinants of Health   Financial Resource Strain:   . Difficulty of Paying Living Expenses: Not on file  Food Insecurity:   . Worried About Charity fundraiser in the Last Year: Not on file  . Ran Out of Food in the Last Year: Not on file  Transportation Needs: No Transportation Needs  . Lack of Transportation (Medical): No  . Lack of Transportation (Non-Medical): No  Physical Activity:   . Days of  Exercise per Week: Not on file  . Minutes of Exercise per Session: Not on file  Stress:   . Feeling of Stress : Not on file  Social Connections:   . Frequency of Communication with Friends and Family: Not on file  . Frequency of Social Gatherings with Friends and Family: Not on file  . Attends Religious Services: Not on file  . Active Member of Clubs or Organizations: Not on file  . Attends Archivist Meetings: Not on file  . Marital Status: Not on file  Intimate Partner Violence:   . Fear of Current or Ex-Partner: Not on file  . Emotionally Abused: Not on file  . Physically Abused: Not on file  . Sexually Abused: Not on file      PHYSICAL EXAM  There were no vitals filed  for this visit. There is no height or weight on file to calculate BMI.  Generalized: Well developed, in no acute distress  Cardiology: normal rate and rhythm, no murmur noted Respiratory: clear to auscultation bilaterally  Neurological examination  Mentation: Alert oriented to time, place, history taking. Follows all commands speech and language fluent Cranial nerve II-XII: Pupils were equal round reactive to light. Extraocular movements were full, visual field were full on confrontational test. Facial sensation and strength were normal. Uvula tongue midline. Head turning and shoulder shrug  were normal and symmetric. Motor: The motor testing reveals 5 over 5 strength of all 4 extremities. Good symmetric motor tone is noted throughout.  Sensory: Sensory testing is intact to soft touch on all 4 extremities. No evidence of extinction is noted.  Coordination: Cerebellar testing reveals good finger-nose-finger and heel-to-shin bilaterally.  Gait and station: Gait is normal. Tandem gait is normal. Romberg is negative. No drift is seen.  Reflexes: Deep tendon reflexes are symmetric and normal bilaterally.   DIAGNOSTIC DATA (LABS, IMAGING, TESTING) - I reviewed patient records, labs, notes, testing and  imaging myself where available.  No flowsheet data found.   Lab Results  Component Value Date   WBC 12.0 (H) 01/02/2020   HGB 13.1 01/02/2020   HCT 38.8 01/02/2020   MCV 93.7 01/02/2020   PLT 300 01/02/2020      Component Value Date/Time   NA 134 (L) 01/05/2020 0451   NA 138 06/12/2018 1420   K 3.9 01/05/2020 0451   CL 101 01/05/2020 0451   CO2 22 01/05/2020 0451   GLUCOSE 133 (H) 01/05/2020 0451   GLUCOSE 78 02/06/2016 1149   BUN 7 01/05/2020 0451   BUN 13 06/12/2018 1420   CREATININE 0.55 01/05/2020 0451   CREATININE 0.67 11/06/2016 1110   CALCIUM 9.3 01/05/2020 0451   PROT 6.7 01/04/2020 1158   PROT 6.7 06/12/2018 1420   ALBUMIN 3.5 01/04/2020 1158   ALBUMIN 4.4 06/12/2018 1420   AST 14 (L) 01/04/2020 1158   ALT 19 01/04/2020 1158   ALKPHOS 31 (L) 01/04/2020 1158   BILITOT 0.8 01/04/2020 1158   BILITOT 0.4 06/12/2018 1420   GFRNONAA >60 01/05/2020 0451   GFRAA >60 01/05/2020 0451   No results found for: CHOL, HDL, LDLCALC, LDLDIRECT, TRIG, CHOLHDL Lab Results  Component Value Date   HGBA1C 5.6 06/12/2018   No results found for: VITAMINB12 Lab Results  Component Value Date   TSH 0.735 06/12/2018       ASSESSMENT AND PLAN 39 y.o. year old female  has a past medical history of Asthma, Diabetes mellitus, GERD (gastroesophageal reflux disease), and Gestational diabetes. here with ***    ICD-10-CM   1. History of subarachnoid hemorrhage  Z86.79   2. Recurrent headache  R51.9        No orders of the defined types were placed in this encounter.    No orders of the defined types were placed in this encounter.     I spent 15 minutes with the patient. 50% of this time was spent counseling and educating patient on plan of care and medications.    Debbora Presto, FNP-C 05/08/2020, 10:19 AM [facility] [address] [place], [place] [address] [phone]

## 2020-05-26 ENCOUNTER — Telehealth: Payer: Self-pay | Admitting: Family Medicine

## 2020-05-26 NOTE — Telephone Encounter (Signed)
Copied from [place] [phone]. Topic: General - Other >> May 26, 2020  8:41 AM Lennox Solders wrote: Reason for CRM: Pt is calling and would like to apply for orange card

## 2020-05-26 NOTE — Telephone Encounter (Signed)
Pt was call with the interpreter and was inform that she need to be see by the PCP since last time she saw the pcp was over 10 month, after she see the PCP she can apply for the programs that [facility] offer

## 2020-06-28 ENCOUNTER — Other Ambulatory Visit: Payer: Self-pay

## 2020-06-28 ENCOUNTER — Encounter: Payer: Self-pay | Admitting: Nurse Practitioner

## 2020-06-28 ENCOUNTER — Ambulatory Visit (INDEPENDENT_AMBULATORY_CARE_PROVIDER_SITE_OTHER): Payer: BLUE CROSS/BLUE SHIELD | Admitting: Nurse Practitioner

## 2020-06-28 VITALS — BP 121/73 | HR 91 | Temp 97.9°F | Resp 20 | Ht 64.0 in | Wt 136.8 lb

## 2020-06-28 DIAGNOSIS — H9312 Tinnitus, left ear: Secondary | ICD-10-CM

## 2020-06-28 DIAGNOSIS — R7303 Prediabetes: Secondary | ICD-10-CM | POA: Diagnosis not present

## 2020-06-28 DIAGNOSIS — Z8639 Personal history of other endocrine, nutritional and metabolic disease: Secondary | ICD-10-CM | POA: Diagnosis not present

## 2020-06-28 LAB — POCT GLYCOSYLATED HEMOGLOBIN (HGB A1C): Hemoglobin A1C: 6.1 % — AB (ref 4.0–5.6)

## 2020-06-28 NOTE — Patient Instructions (Signed)
Tinnitus Tinnitus refers to hearing a sound when there is no actual source for that sound. This is often described as ringing in the ears. However, people with this condition may hear a variety of noises, in one ear or in both ears. The sounds of tinnitus can be soft, loud, or somewhere in between. Tinnitus can last for a few seconds or can be constant for days. It may go away without treatment and come back at various times. When tinnitus is constant or happens often, it can lead to other problems, such as trouble sleeping and trouble concentrating. Almost everyone experiences tinnitus at some point. Tinnitus that is long-lasting (chronic) or comes back often (recurs) may require medical attention. What are the causes? The cause of tinnitus is often not known. In some cases, it can result from:  Exposure to loud noises from machinery, music, or other sources.  An object (foreign body) stuck in the ear.  Earwax buildup.  Drinking alcohol or caffeine.  Taking certain medicines.  Age-related hearing loss. It may also be caused by medical conditions such as:  Ear or sinus infections.  High blood pressure.  Heart diseases.  Anemia.  Allergies.  Meniere's disease.  Thyroid problems.  Tumors.  A weak, bulging blood vessel (aneurysm) near the ear. What are the signs or symptoms? The main symptom of tinnitus is hearing a sound when there is no source for that sound. It may sound like:  Buzzing.  Roaring.  Ringing.  Blowing air.  Hissing.  Whistling.  Sizzling.  Humming.  Running water.  A musical note.  Tapping. Symptoms may affect only one ear (unilateral) or both ears (bilateral). How is this diagnosed? Tinnitus is diagnosed based on your symptoms, your medical history, and a physical exam. Your health care provider may do a thorough hearing test (audiologic exam) if your tinnitus:  Is unilateral.  Causes hearing difficulties.  Lasts 6 months or  longer. You may work with a health care provider who specializes in hearing disorders (audiologist). You may be asked questions about your symptoms and how they affect your daily life. You may have other tests done, such as:  CT scan.  MRI.  An imaging test of how blood flows through your blood vessels (angiogram). How is this treated? Treating an underlying medical condition can sometimes make tinnitus go away. If your tinnitus continues, other treatments may include:  Medicines.  Therapy and counseling to help you manage the stress of living with tinnitus.  Sound generators to mask the tinnitus. These include: ? Tabletop sound machines that play relaxing sounds to help you fall asleep. ? Wearable devices that fit in your ear and play sounds or music. ? Acoustic neural stimulation. This involves using headphones to listen to music that contains an auditory signal. Over time, listening to this signal may change some pathways in your brain and make you less sensitive to tinnitus. This treatment is used for very severe cases when no other treatment is working.  Using hearing aids or cochlear implants if your tinnitus is related to hearing loss. Hearing aids are worn in the outer ear. Cochlear implants are surgically placed in the inner ear. Follow these instructions at home: Managing symptoms      When possible, avoid being in loud places and being exposed to loud sounds.  Wear hearing protection, such as earplugs, when you are exposed to loud noises.  Use a white noise machine, a humidifier, or other devices to mask the sound of tinnitus.    Practice techniques for reducing stress, such as meditation, yoga, or deep breathing. Work with your health care provider if you need help with managing stress.  Sleep with your head slightly raised. This may reduce the impact of tinnitus. General instructions  Do not use stimulants, such as nicotine, alcohol, or caffeine. Talk with your health  care provider about other stimulants to avoid. Stimulants are substances that can make you feel alert and attentive by increasing certain activities in the body (such as heart rate and blood pressure). These substances may make tinnitus worse.  Take over-the-counter and prescription medicines only as told by your health care provider.  Try to get plenty of sleep each night.  Keep all follow-up visits as told by your health care provider. This is important. Contact a health care provider if:  Your tinnitus continues for 3 weeks or longer without stopping.  You develop sudden hearing loss.  Your symptoms get worse or do not get better with home care.  You feel you are not able to manage the stress of living with tinnitus. Get help right away if:  You develop tinnitus after a head injury.  You have tinnitus along with any of the following: ? Dizziness. ? Loss of balance. ? Nausea and vomiting. ? Sudden, severe headache. These symptoms may represent a serious problem that is an emergency. Do not wait to see if the symptoms will go away. Get medical help right away. Call your local emergency services (911 in the [place] Do not drive yourself to the hospital. Summary  Tinnitus refers to hearing a sound when there is no actual source for that sound. This is often described as ringing in the ears.  Symptoms may affect only one ear (unilateral) or both ears (bilateral).  Use a white noise machine, a humidifier, or other devices to mask the sound of tinnitus.  Do not use stimulants, such as nicotine, alcohol, or caffeine. Talk with your health care provider about other stimulants to avoid. These substances may make tinnitus worse. This information is not intended to replace advice given to you by your health care provider. Make sure you discuss any questions you have with your health care provider. Document Revised: 03/17/2019 Document Reviewed: 06/12/2017 Elsevier Patient Education  [place]  Dental caries are spots of decay (cavities) in teeth. They are in the outer layer of your tooth (enamel). Treat them as soon as you can. If they are not treated, they can spread decay and lead to painful infection. Follow these instructions at home: General instructions  Take good care of your mouth and teeth. This keeps them healthy. ? Brush your teeth 2 times a day. Use toothpaste with fluoride in it. ? Floss your teeth once a day.  If your dentist prescribed an antibiotic medicine to treat an infection, take it as told. Do not stop taking the antibiotic even if your condition gets better.  Keep all follow-up visits as told by your dentist. This is important. This includes all cleanings. Preventing dental caries   Brush your teeth every morning and night. Use fluoride toothpaste.  Get regular dental cleanings.  If you are at risk of dental caries. ? Wash your mouth with prescription mouthwash (chlorhexidine). ? Put topical fluoride on your teeth.  Drink water with fluoride in it.  Drink water instead of sugary drinks.  Eat healthy meals and snacks. Contact a doctor if:  You have symptoms of tooth decay. Summary  Dental caries  are spots of decay (cavities) in teeth. They are in the outer layer of your tooth.  Take an antibiotic to treat an infection, if told by your dentist. Do not stop taking the antibiotic even if your condition gets better.  Regular dental cleanings and brushing can help prevent dental caries. This information is not intended to replace advice given to you by your health care provider. Make sure you discuss any questions you have with your health care provider. Document Revised: 08/15/2017 Document Reviewed: 05/19/2016 Elsevier Patient Education  2020 [employer].

## 2020-06-28 NOTE — Progress Notes (Signed)
Sylvia Best, [place]  [id number] Phone:  [phone]   Fax:  [phone]   New Patient Office Visit  Subjective:  Patient ID: Sylvia Best, female    DOB: 1981-08-08  Age: 39 y.o. MRN: [id number]  CC:  Chief Complaint  Patient presents with  . Establish Care    HPI Sylvia Best presents to establish care. She has been a patient of this office in the past. She has recently been treated at [facility].   has a past medical history of Asthma, Diabetes mellitus, GERD (gastroesophageal reflux disease), and Gestational diabetes.   Tinnitus Patient presents with tinnitus. Onset of symptoms was gradual starting several months ago ago with gradually improving course since that time. Patient describes the tinnitus as fluctuating located in the left ear. The quality is described as "whooshing sound"a sounds like air that sounds like seashore/waves. The pattern is pulsatile with an intensity that is medium. Patient describes her level of annoyance as quite annoying, always aware. Associated symptoms include dizziness. She has a history of recurrent otitis  Family history is negative family history for tinnitus Patient has had a prior evaluation for tinnitus by [facility]. Patient does not have hearing aids at this time. Previous treatments include antidepressants. She has a history of Subarachnoids hemorraghic. She feels like this was related to the COV-19 vaccintation  She  Past Medical History:  Diagnosis Date  . Asthma   . Diabetes mellitus    gestational  . GERD (gastroesophageal reflux disease)    no meds  . Gestational diabetes     Past Surgical History:  Procedure Laterality Date  . CESAREAN SECTION  04/26/2011   Procedure: CESAREAN SECTION;  Surgeon: Donnamae Jude, MD;  Location: [facility];  Service: Gynecology;  Laterality: N/A;  . CESAREAN SECTION N/A 04/05/2016   Procedure: CESAREAN SECTION;  Surgeon: Osborne Oman, MD;  Location: [facility];   Service: Obstetrics;  Laterality: N/A;  . DILATION AND CURETTAGE OF UTERUS    . IR ANGIO INTRA EXTRACRAN SEL COM CAROTID INNOMINATE BILAT MOD SED  01/04/2020  . IR ANGIO VERTEBRAL SEL VERTEBRAL BILAT MOD SED  01/04/2020  . IR US GUIDE VASC ACCESS RIGHT  01/04/2020  . TONSILLECTOMY     age 25    Family History  Problem Relation Age of Onset  . Hypertension Mother   . Heart disease Father   . Diabetes Father   . Diabetes Paternal Grandfather     Social History   Socioeconomic History  . Marital status: Married    Spouse name: Not on file  . Number of children: Not on file  . Years of education: Not on file  . Highest education level: Associate degree: occupational, Hotel manager, or vocational program  Occupational History  . Not on file  Tobacco Use  . Smoking status: Never Smoker  . Smokeless tobacco: Never Used  Vaping Use  . Vaping Use: Never used  Substance and Sexual Activity  . Alcohol use: No  . Drug use: No  . Sexual activity: Yes    Birth control/protection: Pill  Other Topics Concern  . Not on file  Social History Narrative  . Not on file   Social Determinants of Health   Financial Resource Strain:   . Difficulty of Paying Living Expenses: Not on file  Food Insecurity:   . Worried About Charity fundraiser in the Last Year: Not on file  . Ran Out of  Food in the Last Year: Not on file  Transportation Needs: No Transportation Needs  . Lack of Transportation (Medical): No  . Lack of Transportation (Non-Medical): No  Physical Activity:   . Days of Exercise per Week: Not on file  . Minutes of Exercise per Session: Not on file  Stress:   . Feeling of Stress : Not on file  Social Connections:   . Frequency of Communication with Friends and Family: Not on file  . Frequency of Social Gatherings with Friends and Family: Not on file  . Attends Religious Services: Not on file  . Active Member of Clubs or Organizations: Not on file  . Attends Archivist  Meetings: Not on file  . Marital Status: Not on file  Intimate Partner Violence:   . Fear of Current or Ex-Partner: Not on file  . Emotionally Abused: Not on file  . Physically Abused: Not on file  . Sexually Abused: Not on file    ROS Review of Systems  Constitutional: Positive for unexpected weight change (5-6 pounds in the last April to now).  HENT:       Dental caries  Eyes: Positive for visual disturbance (blurred vision).  Respiratory: Negative for shortness of breath.   Cardiovascular: Negative for chest pain.  Gastrointestinal: Negative for constipation, diarrhea and nausea.  Endocrine: Negative.   Genitourinary: Negative.   Musculoskeletal: Positive for arthralgias and myalgias.  Skin: Negative.        Nail changes  Allergic/Immunologic: Negative.   Neurological: Positive for dizziness (occasional) and headaches.  Hematological: Negative.     Objective:   Today's Vitals: BP 121/73 (BP Location: Left Arm, Patient Position: Sitting, Cuff Size: Normal)   Pulse 91   Temp 97.9 F (36.6 C)   Resp 20   Ht 5\' 4"  (1.626 m)   Wt 136 lb 12.8 oz (62.1 kg)   SpO2 100%   BMI 23.48 kg/m   Physical Exam Constitutional:      Appearance: She is normal weight.  HENT:     Head: Normocephalic and atraumatic.     Right Ear: Tympanic membrane, ear canal and external ear normal.     Left Ear: Tympanic membrane, ear canal and external ear normal.     Nose: Nose normal.     Mouth/Throat:     Mouth: Mucous membranes are moist.     Comments: Dental caries Eyes:     Pupils: Pupils are equal, round, and reactive to light.  Cardiovascular:     Rate and Rhythm: Normal rate and regular rhythm.     Pulses: Normal pulses.     Heart sounds: Normal heart sounds.  Pulmonary:     Effort: Pulmonary effort is normal.     Breath sounds: Normal breath sounds.  Abdominal:     General: Bowel sounds are normal.     Palpations: Abdomen is soft.  Musculoskeletal:        General: Normal  range of motion.     Cervical back: Normal range of motion.  Skin:    General: Skin is warm and dry.     Capillary Refill: Capillary refill takes less than 2 seconds.  Neurological:     General: No focal deficit present.     Mental Status: She is alert and oriented to person, place, and time.  Psychiatric:        Mood and Affect: Mood normal.        Behavior: Behavior normal.  Thought Content: Thought content normal.        Judgment: Judgment normal.     Assessment & Plan:   Problem List Items Addressed This Visit      Other   History of diabetes mellitus   Relevant Orders   POCT HgB A1C (Completed)    Other Visit Diagnoses    Tinnitus of left ear    -  Primary   Relevant Orders   Ambulatory referral to ENT   Pre-diabetes     Consider home glucose monitoring Lifestyle modification dietary changes and regular daily exercise Encourage blood pressure control goal <120/80 and maintaining total cholesterol <200 Follow-up every 3 to 6 months for reevaluation       Outpatient Encounter Medications as of 06/28/2020  Medication Sig  . cetirizine (ZYRTEC) 10 MG tablet Take by mouth.  Marland Kitchen acetaminophen (TYLENOL) 500 MG tablet Take 1 tablet (500 mg total) by mouth every 6 (six) hours as needed.  . etonogestrel (NEXPLANON) 68 MG IMPL implant 1 each by Subdermal route continuous.   . fluticasone (FLONASE) 50 MCG/ACT nasal spray Place into both nostrils daily.  . meloxicam (MOBIC) 7.5 MG tablet Take 7.5 mg by mouth daily.  . [DISCONTINUED] amitriptyline (ELAVIL) 25 MG tablet 1/2 pill each bedtime x 1 week, then 1 pill nightly x 1 week, then 1 1/2 pills nightly x 1 week, then 2 pills nightly thereafter. (Patient not taking: Reported on 06/28/2020)  . [DISCONTINUED] OXYCODONE-ACETAMINOPHEN PO Take by mouth. (Patient not taking: Reported on 06/28/2020)  . [DISCONTINUED] prochlorperazine (COMPAZINE) 5 MG tablet Take 5 mg by mouth every 6 (six) hours as needed for nausea or vomiting.  (Patient not taking: Reported on 06/28/2020)   No facility-administered encounter medications on file as of 06/28/2020.    Follow-up: Return in about 6 months (around 12/27/2020).   Vevelyn Francois, NP

## 2021-08-02 ENCOUNTER — Ambulatory Visit: Payer: Medicaid Other | Admitting: Cardiology

## 2022-05-28 ENCOUNTER — Ambulatory Visit (HOSPITAL_BASED_OUTPATIENT_CLINIC_OR_DEPARTMENT_OTHER): Payer: BLUE CROSS/BLUE SHIELD | Admitting: Family Medicine

## 2022-06-12 ENCOUNTER — Ambulatory Visit (INDEPENDENT_AMBULATORY_CARE_PROVIDER_SITE_OTHER): Payer: Commercial Managed Care - HMO | Admitting: Family Medicine

## 2022-06-12 ENCOUNTER — Encounter: Payer: Self-pay | Admitting: Family Medicine

## 2022-06-12 VITALS — BP 114/72 | HR 81 | Temp 97.8°F | Ht 64.0 in | Wt 136.8 lb

## 2022-06-12 DIAGNOSIS — H538 Other visual disturbances: Secondary | ICD-10-CM | POA: Diagnosis not present

## 2022-06-12 DIAGNOSIS — M25561 Pain in right knee: Secondary | ICD-10-CM | POA: Diagnosis not present

## 2022-06-12 DIAGNOSIS — G8929 Other chronic pain: Secondary | ICD-10-CM

## 2022-06-12 DIAGNOSIS — Z0000 Encounter for general adult medical examination without abnormal findings: Secondary | ICD-10-CM | POA: Diagnosis not present

## 2022-06-12 DIAGNOSIS — Z8632 Personal history of gestational diabetes: Secondary | ICD-10-CM

## 2022-06-12 DIAGNOSIS — G43409 Hemiplegic migraine, not intractable, without status migrainosus: Secondary | ICD-10-CM

## 2022-06-12 LAB — COMPREHENSIVE METABOLIC PANEL
ALT: 15 U/L (ref 0–35)
AST: 14 U/L (ref 0–37)
Albumin: 4.4 g/dL (ref 3.5–5.2)
Alkaline Phosphatase: 44 U/L (ref 39–117)
BUN: 10 mg/dL (ref 6–23)
CO2: 25 mEq/L (ref 19–32)
Calcium: 9.7 mg/dL (ref 8.4–10.5)
Chloride: 103 mEq/L (ref 96–112)
Creatinine, Ser: 0.59 mg/dL (ref 0.40–1.20)
GFR: 111.51 mL/min (ref >60.00)
Glucose, Bld: 147 mg/dL — ABNORMAL HIGH (ref 70–99)
Potassium: 3.6 mEq/L (ref 3.5–5.1)
Sodium: 135 mEq/L (ref 135–145)
Total Bilirubin: 0.7 mg/dL (ref 0.2–1.2)
Total Protein: 7.2 g/dL (ref 6.0–8.3)

## 2022-06-12 LAB — CBC
HCT: 37.9 % (ref 36.0–46.0)
Hemoglobin: 13.2 g/dL (ref 12.0–15.0)
MCHC: 34.8 g/dL (ref 30.0–36.0)
MCV: 92 fl (ref 78.0–100.0)
Platelets: 215 10*3/uL (ref 150.0–400.0)
RBC: 4.12 Mil/uL (ref 3.87–5.11)
RDW: 12.6 % (ref 11.5–15.5)
WBC: 5.1 10*3/uL (ref 4.0–10.5)

## 2022-06-12 LAB — MICROALBUMIN / CREATININE URINE RATIO
Creatinine,U: 192.5 mg/dL
Microalb Creat Ratio: 0.4 mg/g (ref 0.0–30.0)
Microalb, Ur: <0.7 mg/dL (ref 0.0–1.9)

## 2022-06-12 LAB — URINALYSIS, ROUTINE W REFLEX MICROSCOPIC
Hgb urine dipstick: NEGATIVE
Ketones, ur: NEGATIVE
Leukocytes,Ua: NEGATIVE
Nitrite: NEGATIVE
Specific Gravity, Urine: 1.025 (ref 1.000–1.030)
Total Protein, Urine: NEGATIVE
Urine Glucose: NEGATIVE
Urobilinogen, UA: 0.2 (ref 0.0–1.0)
pH: 6 (ref 5.0–8.0)

## 2022-06-12 LAB — LIPID PANEL
Cholesterol: 179 mg/dL (ref 0–200)
HDL: 43.2 mg/dL (ref >39.00)
LDL Cholesterol: 113 mg/dL — ABNORMAL HIGH (ref 0–99)
NonHDL: 135.46
Total CHOL/HDL Ratio: 4
Triglycerides: 111 mg/dL (ref 0.0–149.0)
VLDL: 22.2 mg/dL (ref 0.0–40.0)

## 2022-06-12 LAB — HEMOGLOBIN A1C: Hgb A1c MFr Bld: 6.4 % (ref 4.6–6.5)

## 2022-06-12 MED ORDER — PREDNISONE 10 MG PO TABS: 10.0000 mg | ORAL_TABLET | Freq: Two times a day (BID) | ORAL | 0 refills | Status: AC

## 2022-06-12 MED ORDER — ZOLMITRIPTAN 2.5 MG PO TABS: 2.5000 mg | ORAL_TABLET | Freq: Once | ORAL | 0 refills | Status: DC

## 2022-06-12 NOTE — Progress Notes (Signed)
New Patient Office Visit  Subjective    Patient ID: Sylvia Best, female    DOB: [birth year]  Age: 41 y.o. MRN: [id number]  CC:  Chief Complaint  Patient presents with  . Establish Care    NP/establish care, no concerns. Patient fasting.     HPI Sylvia Best presents to establish care For establishment of care and health check.  She is here with an interpreter today.  She is fasting today.  She has a history of gestational diabetes with a pregnancy 6 years ago.  She believes that the diabetes may have resolved.  She had been treated in the past with metformin and did not tolerate it.  Another medication was not started she tells me.  She has been having troubles with intermittent blurred vision.  She denies diplopia.  She has a history of migraine headaches.  She denies prodromal symptoms.  There is associated nausea with occasional vomiting.  There is light and sound sensitivity.  They have been coming quite regularly up to 2-3 times weekly.  They are nonprogressive.  She has been treating them prophylactically with meloxicam.  Ongoing issues with her right knee.  She has had multiple injections.  She mentions a problem with her right wrist.  She is seeing GYN for regular female care.  Outpatient Encounter Medications as of 06/12/2022  Medication Sig  . acetaminophen (TYLENOL) 500 MG tablet Take 1 tablet (500 mg total) by mouth every 6 (six) hours as needed.  . cetirizine (ZYRTEC) 10 MG tablet Take 10 mg by mouth daily.  Marland Kitchen etonogestrel (NEXPLANON) 68 MG IMPL implant 1 each by Subdermal route once.  . fluticasone (FLONASE) 50 MCG/ACT nasal spray Place into both nostrils daily.  . meloxicam (MOBIC) 7.5 MG tablet Take 7.5 mg by mouth daily.  . predniSONE (DELTASONE) 10 MG tablet Take 1 tablet (10 mg total) by mouth 2 (two) times daily with a meal for 5 days.  Marland Kitchen ZOLMitriptan (ZOMIG) 2.5 MG tablet Take 1 tablet (2.5 mg total) by mouth once for 1 dose. May repeat once in 2 hours if headache  persists or recurs.  . [DISCONTINUED] etonogestrel (NEXPLANON) 68 MG IMPL implant 1 each by Subdermal route continuous.    No facility-administered encounter medications on file as of 06/12/2022.    Past Medical History:  Diagnosis Date  . Asthma   . Diabetes mellitus    gestational  . GERD (gastroesophageal reflux disease)    no meds  . Gestational diabetes     Past Surgical History:  Procedure Laterality Date  . CESAREAN SECTION  04/26/2011   Procedure: CESAREAN SECTION;  Surgeon: Donnamae Jude, MD;  Location: [facility];  Service: Gynecology;  Laterality: N/A;  . CESAREAN SECTION N/A 04/05/2016   Procedure: CESAREAN SECTION;  Surgeon: Osborne Oman, MD;  Location: [facility];  Service: Obstetrics;  Laterality: N/A;  . DILATION AND CURETTAGE OF UTERUS    . IR ANGIO INTRA EXTRACRAN SEL COM CAROTID INNOMINATE BILAT MOD SED  01/04/2020  . IR ANGIO VERTEBRAL SEL VERTEBRAL BILAT MOD SED  01/04/2020  . IR US GUIDE VASC ACCESS RIGHT  01/04/2020  . TONSILLECTOMY     age 42    Family History  Problem Relation Age of Onset  . Hypertension Mother   . Heart disease Father   . Diabetes Father   . Diabetes Paternal Grandfather     Social History   Socioeconomic History  . Marital status: Married    Spouse name:  Not on file  . Number of children: Not on file  . Years of education: Not on file  . Highest education level: Associate degree: occupational, Hotel manager, or vocational program  Occupational History  . Not on file  Tobacco Use  . Smoking status: Never  . Smokeless tobacco: Never  Vaping Use  . Vaping Use: Never used  Substance and Sexual Activity  . Alcohol use: No  . Drug use: No  . Sexual activity: Yes    Birth control/protection: Implant  Other Topics Concern  . Not on file  Social History Narrative  . Not on file   Social Determinants of Health   Financial Resource Strain: Not on file  Food Insecurity: Not on file  Transportation Needs: No  Transportation Needs (07/15/2019)   PRAPARE - Transportation   . Lack of Transportation (Medical): No   . Lack of Transportation (Non-Medical): No  Physical Activity: Not on file  Stress: Not on file  Social Connections: Not on file  Intimate Partner Violence: Not on file    Review of Systems  Constitutional: Negative.   HENT: Negative.    Eyes:  Positive for blurred vision. Negative for discharge and redness.  Respiratory: Negative.    Cardiovascular: Negative.   Gastrointestinal:  Positive for nausea and vomiting. Negative for abdominal pain.  Genitourinary: Negative.   Musculoskeletal:  Positive for joint pain. Negative for myalgias.  Skin:  Negative for rash.  Neurological:  Positive for headaches. Negative for tingling, loss of consciousness and weakness.  Endo/Heme/Allergies:  Negative for polydipsia.      06/12/2022   11:06 AM 06/12/2022   10:31 AM 11/23/2018    1:16 PM  Depression screen PHQ 2/9  Decreased Interest 0 0 0  Down, Depressed, Hopeless 0 0 0  PHQ - 2 Score 0 0 0  Altered sleeping 0    Tired, decreased energy 0    Change in appetite 0    Feeling bad or failure about yourself  0    Trouble concentrating 0    Moving slowly or fidgety/restless 0    Suicidal thoughts 0    PHQ-9 Score 0    Difficult doing work/chores Not difficult at all           Objective    BP 114/72 (BP Location: Right Arm, Patient Position: Sitting, Cuff Size: Normal)   Pulse 81   Temp 97.8 F (36.6 C) (Temporal)   Ht '5\' 4"'$  (1.626 m)   Wt 136 lb 12.8 oz (62.1 kg)   SpO2 97%   BMI 23.48 kg/m   Physical Exam Constitutional:      General: She is not in acute distress.    Appearance: Normal appearance. She is not ill-appearing, toxic-appearing or diaphoretic.  HENT:     Head: Normocephalic and atraumatic.     Right Ear: External ear normal.     Left Ear: External ear normal.     Mouth/Throat:     Mouth: Mucous membranes are moist.     Pharynx: Oropharynx is clear. No  oropharyngeal exudate or posterior oropharyngeal erythema.  Eyes:     General: No scleral icterus.       Right eye: No discharge.        Left eye: No discharge.     Extraocular Movements: Extraocular movements intact.     Conjunctiva/sclera: Conjunctivae normal.     Pupils: Pupils are equal, round, and reactive to light.  Cardiovascular:     Rate and Rhythm: Normal rate  and regular rhythm.  Pulmonary:     Effort: Pulmonary effort is normal. No respiratory distress.     Breath sounds: Normal breath sounds.  Abdominal:     General: Bowel sounds are normal.     Tenderness: There is no abdominal tenderness. There is no guarding.  Musculoskeletal:     Cervical back: No rigidity or tenderness.  Skin:    General: Skin is warm and dry.  Neurological:     Mental Status: She is alert and oriented to person, place, and time.  Psychiatric:        Mood and Affect: Mood normal.        Behavior: Behavior normal.   Diabetic Foot Exam - Simple   Simple Foot Form Diabetic Foot exam was performed with the following findings: Yes 06/12/2022 11:12 AM  Visual Inspection No deformities, no ulcerations, no other skin breakdown bilaterally: Yes Sensation Testing Intact to touch and monofilament testing bilaterally: Yes Pulse Check Posterior Tibialis and Dorsalis pulse intact bilaterally: Yes Comments Feet are cavus bilaterally.         Assessment & Plan:   Problem List Items Addressed This Visit       Cardiovascular and Mediastinum   Hemiplegic migraine without status migrainosus, not intractable   Relevant Medications   predniSONE (DELTASONE) 10 MG tablet   ZOLMitriptan (ZOMIG) 2.5 MG tablet     Other   History of gestational diabetes - Primary   Relevant Orders   Comprehensive metabolic panel   Hemoglobin A1c   Microalbumin / creatinine urine ratio   Chronic pain of right knee   Relevant Orders   Ambulatory referral to Sports Medicine   Healthcare maintenance   Relevant Orders    CBC   Lipid panel   Urinalysis, Routine w reflex microscopic   Blurred vision, bilateral    Return in about 4 weeks (around 07/10/2022), or hold meloxicam.   With the frequency of her migraines treated with meloxicam believe that she is having rebound phenomenon.  Have asked her to hold the meloxicam.  And take a brief course of prednisone at low-dose out of respect for probable migraine.  We will try Zomig.  May wind up needing a maintenance medicine.  Believe that blurred vision may be secondary to untreated diabetes.  We will probably be starting a medication for diabetes pending results of today's blood work.  Ambulatory referral to sports medicine for chronic right knee pain.  She also mentioned ongoing right wrist pain.  Declines flu vaccine today.  Libby Maw, MD

## 2022-06-14 ENCOUNTER — Telehealth: Payer: Self-pay

## 2022-06-14 NOTE — Telephone Encounter (Signed)
PA for Zolmitriptan 2.5 mg tabs submitted through cover my meds.   Awaiting response.  Dm/cma   Key: [id number]

## 2022-06-17 NOTE — Telephone Encounter (Signed)
Per Christella Scheuermann, they will cover 6 tablets a month.  Called [facility] and RX went through at no cost for 6 tablets.  They will notify patient. Dm/cma

## 2022-06-18 ENCOUNTER — Ambulatory Visit: Payer: Commercial Managed Care - HMO | Admitting: Sports Medicine

## 2022-06-18 DIAGNOSIS — M25531 Pain in right wrist: Secondary | ICD-10-CM | POA: Diagnosis not present

## 2022-06-18 MED ORDER — DICLOFENAC SODIUM 75 MG PO TBEC: DELAYED_RELEASE_TABLET | ORAL | 0 refills | Status: DC

## 2022-06-18 NOTE — Progress Notes (Signed)
   Subjective:    Patient ID: Sylvia Best, female    DOB: [birth year], 41 y.o.   MRN: [id number]  HPI chief complaint: Right forearm and right knee pain  Patient is a 41 year old left-hand-dominant female that presents today with a couple of different complaints.  Main complaint is right forearm and wrist pain that is worse when lifting heavy objects.  She works at [employer] and this is a part of her job.  She endorses diffuse pain to the volar aspect of the forearm with radiating pain into the wrist with lifting.  She does note some discoloration at times as well.  She denies swelling.  She gets similar symptoms in the left arm but not as severe.  No prior surgeries here.  She was given a prescription for prednisone which was helpful.  Also a prescription for a muscle relaxer which tends to make her sleepy.  She is also complaining of anterior right knee pain.  This is a chronic issue for her.  X-rays done a few years ago were fairly unremarkable.  She localizes her pain around the patella.  She does endorse intermittent swelling.  No known trauma.  No prior knee surgeries.    Review of Systems As above    Objective:   Physical Exam  Well-developed, well-nourished.  No acute distress  Right elbow: Full range of motion.  No effusion.  No tenderness to palpation.  Examination of the right forearm shows tenderness diffusely along the volar forearm but there is no obvious swelling.  Compartments are soft.  No skin color changes.  She has reproducible wrist and forearm pain with resisted wrist flexion and extension.  Good pulses distally.  No atrophy.  Right knee: Full range of motion.  No effusion.  Positive J sign.  Knees are stable to ligamentous exam grossly.  Neurovascular intact distally.      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  I would also like to try Voltaren 75 mg twice daily with food.  This may also help her knee.  We will give her VMO exercises to work on knee strengthening and she will follow-up in 3 to 4 weeks.  This note was dictated using Dragon naturally speaking software and may contain errors in syntax, spelling, or content which have not been identified prior to signing this note.

## 2022-06-20 ENCOUNTER — Telehealth: Payer: Self-pay | Admitting: Family Medicine

## 2022-06-20 NOTE — Telephone Encounter (Signed)
Caller Name: Blimy Napoleon Call back phone #: [phone]  Reason for Call: Please call pt to speak about recent lab results

## 2022-06-25 ENCOUNTER — Encounter: Payer: Self-pay | Admitting: Family Medicine

## 2022-06-25 ENCOUNTER — Ambulatory Visit (INDEPENDENT_AMBULATORY_CARE_PROVIDER_SITE_OTHER): Payer: Commercial Managed Care - HMO | Admitting: Family Medicine

## 2022-06-25 VITALS — BP 112/70 | HR 100 | Temp 98.2°F | Ht 65.0 in | Wt 135.6 lb

## 2022-06-25 DIAGNOSIS — G43409 Hemiplegic migraine, not intractable, without status migrainosus: Secondary | ICD-10-CM

## 2022-06-25 DIAGNOSIS — B3731 Acute candidiasis of vulva and vagina: Secondary | ICD-10-CM

## 2022-06-25 DIAGNOSIS — R7303 Prediabetes: Secondary | ICD-10-CM

## 2022-06-25 MED ORDER — TERCONAZOLE 0.8 % VA CREA: 1.0000 | TOPICAL_CREAM | Freq: Every day | VAGINAL | 0 refills | Status: DC

## 2022-06-25 MED ORDER — FLUCONAZOLE 150 MG PO TABS: 150.0000 mg | ORAL_TABLET | Freq: Once | ORAL | 0 refills | Status: AC

## 2022-06-25 MED ORDER — ZOLMITRIPTAN 2.5 MG PO TABS: 2.5000 mg | ORAL_TABLET | Freq: Once | ORAL | 0 refills | Status: DC

## 2022-06-25 MED ORDER — METFORMIN HCL ER 500 MG PO TB24: 500.0000 mg | ORAL_TABLET | Freq: Every day | ORAL | 2 refills | Status: DC

## 2022-06-25 NOTE — Progress Notes (Signed)
Established Patient Office Visit  Subjective   Patient ID: Sylvia Best, female    DOB: [birth year]  Age: 41 y.o. MRN: [id number]  Chief Complaint  Patient presents with   Vaginal Itching    Continued yeast infection, very itchy sometimes burning vagina x 1 week.     Vaginal Itching Pertinent negatives include no abdominal pain, dysuria, frequency, rash or urgency.   1 week history of vaginal pruritus associated with irritation and whitish discharge.  She is monogamous with her husband.  She has regular female care through GYN.  She is prediabetic.  She had had some trouble in the past while taking metformin.  She was not taking the extended release form.    Review of Systems  Constitutional: Negative.   HENT: Negative.    Eyes:  Negative for blurred vision, discharge and redness.  Respiratory: Negative.    Cardiovascular: Negative.   Gastrointestinal:  Negative for abdominal pain.  Genitourinary: Negative.  Negative for dysuria, frequency and urgency.  Musculoskeletal: Negative.  Negative for myalgias.  Skin:  Negative for rash.  Neurological:  Negative for tingling, loss of consciousness and weakness.  Endo/Heme/Allergies:  Negative for polydipsia.      Objective:     BP 112/70 (BP Location: Right Arm, Patient Position: Sitting, Cuff Size: Normal)   Pulse 100   Temp 98.2 F (36.8 C) (Temporal)   Ht '5\' 5"'$  (1.651 m)   Wt 135 lb 9.6 oz (61.5 kg)   SpO2 97%   BMI 22.57 kg/m    Physical Exam Constitutional:      General: She is not in acute distress.    Appearance: Normal appearance. She is not ill-appearing, toxic-appearing or diaphoretic.  HENT:     Head: Normocephalic and atraumatic.     Right Ear: External ear normal.     Left Ear: External ear normal.  Eyes:     General: No scleral icterus.       Right eye: No discharge.        Left eye: No discharge.     Extraocular Movements: Extraocular movements intact.     Conjunctiva/sclera: Conjunctivae normal.   Cardiovascular:     Rate and Rhythm: Normal rate and regular rhythm.  Pulmonary:     Effort: Pulmonary effort is normal. No respiratory distress.     Breath sounds: Normal breath sounds.  Musculoskeletal:     Cervical back: No rigidity or tenderness.  Skin:    General: Skin is warm and dry.  Neurological:     Mental Status: She is alert and oriented to person, place, and time.  Psychiatric:        Mood and Affect: Mood normal.        Behavior: Behavior normal.      No results found for any visits on 06/25/22.    The ASCVD Risk score (Arnett DK, et al., 2019) failed to calculate for the following reasons:   Unable to determine if patient is Non-Hispanic African American    Assessment & Plan:   Problem List Items Addressed This Visit       Cardiovascular and Mediastinum   Hemiplegic migraine without status migrainosus, not intractable   Relevant Medications   ZOLMitriptan (ZOMIG) 2.5 MG tablet   Other Visit Diagnoses     Yeast vaginitis    -  Primary   Relevant Medications   terconazole (TERAZOL 3) 0.8 % vaginal cream   fluconazole (DIFLUCAN) 150 MG tablet   Prediabetes  Relevant Medications   metFORMIN (GLUCOPHAGE-XR) 500 MG 24 hr tablet       Return Has scheduled appointment in 2 weeks..  She requests both cream and pill.  She understands that the yeast infection could be related to her prediabetes.  Agrees to get metformin XL try.  She understands that the usual side effects of cramping and loose stools goes away after a week or so.  If she has any problems she will let me know and we can try a different medication.  Libby Maw, MD

## 2022-06-25 NOTE — Telephone Encounter (Signed)
Returned patients call to go over labs, no answer LMTCB. Patient called back appointment scheduled.

## 2022-06-27 ENCOUNTER — Telehealth: Payer: Self-pay | Admitting: Family Medicine

## 2022-06-27 ENCOUNTER — Encounter: Payer: Self-pay | Admitting: Family Medicine

## 2022-06-27 NOTE — Telephone Encounter (Signed)
Please advise message below patient states that she can not take this medication

## 2022-06-27 NOTE — Telephone Encounter (Signed)
Pt called in complaining of having an an adverse reaction to being put on metFORMIN (GLUCOPHAGE-XR) 500 MG 24 hr tablet [[id number]], she is having extreme diarrhea ever since she was put on this med. I transferred her over to nurse triage.

## 2022-06-28 ENCOUNTER — Other Ambulatory Visit: Payer: Self-pay

## 2022-06-28 DIAGNOSIS — R7303 Prediabetes: Secondary | ICD-10-CM

## 2022-06-28 NOTE — Addendum Note (Signed)
Addended by: Jon Billings on: 06/28/2022 08:05 AM   Modules accepted: Orders

## 2022-06-28 NOTE — Telephone Encounter (Signed)
Spoke with patient regarding Metformin per patient she will continue on meds she seems to be feeling better today does not wan to schedule virtual states she will address at visit on 07/09/22 if any issues.

## 2022-07-09 ENCOUNTER — Ambulatory Visit: Payer: Commercial Managed Care - HMO | Admitting: Family Medicine

## 2022-07-11 ENCOUNTER — Ambulatory Visit: Payer: Commercial Managed Care - HMO | Admitting: Sports Medicine

## 2022-07-11 ENCOUNTER — Ambulatory Visit
Admission: RE | Admit: 2022-07-11 | Discharge: 2022-07-11 | Disposition: A | Discharge status: Home / Self Care | Payer: Commercial Managed Care - HMO | Source: Ambulatory Visit | Attending: Sports Medicine | Admitting: Sports Medicine

## 2022-07-11 VITALS — BP 90/60 | Ht 65.0 in | Wt 137.0 lb

## 2022-07-11 DIAGNOSIS — M79631 Pain in right forearm: Secondary | ICD-10-CM | POA: Diagnosis not present

## 2022-07-11 DIAGNOSIS — M25531 Pain in right wrist: Secondary | ICD-10-CM | POA: Diagnosis not present

## 2022-07-11 NOTE — Progress Notes (Signed)
   Subjective:    Patient ID: Sylvia Best, female    DOB: May 08, 1981, 41 y.o.   MRN: [id number]  HPI  Patient presents today for follow-up on right forearm pain.  This is a chronic issue for her.  She has found the cock-up wrist brace to be helpful but it has not been curative.  Voltaren has also been helpful but she is experiencing some GI upset with it.  Her symptoms all began in April 2021 after IV contrast was administered in this arm for a CT angiography to evaluate for possible subarachnoid hemorrhage.  Since then she has had intermittent pain as well as skin color changes.  It seems to be activity related.  She denies any numbness or tingling but does endorse intermittent swelling.   Review of Systems As above    Objective:   Physical Exam  Well-developed, well-nourished.  No acute distress  Right forearm: No obvious swelling.  There is some slight discoloration in the volar forearm compared to the left.  It is not warm to touch.  She is tender to palpation in the proximal volar musculature.  No palpable defect.      Assessment & Plan:   Chronic right forearm pain and discoloration  Symptoms all began after IV contrast was administered in April 2021 for a CT angiography.  Would like to order an x-ray and an MRI of the forearm to rule out obvious musculoskeletal reasons for her chronic pain.  I also think an EMG/nerve conduction study is warranted to rule out neurological injury.  I will message her through MyChart with these results when available.  If all of the studies are unremarkable then consider referral to vascular surgery.  This note was dictated using Dragon naturally speaking software and may contain errors in syntax, spelling, or content which have not been identified prior to signing this note.

## 2022-07-12 ENCOUNTER — Other Ambulatory Visit: Payer: Self-pay

## 2022-07-12 DIAGNOSIS — M79631 Pain in right forearm: Secondary | ICD-10-CM

## 2022-07-15 ENCOUNTER — Other Ambulatory Visit: Payer: Self-pay | Admitting: Sports Medicine

## 2022-07-16 ENCOUNTER — Other Ambulatory Visit (HOSPITAL_COMMUNITY)
Admission: RE | Admit: 2022-07-16 | Discharge: 2022-07-16 | Disposition: A | Discharge status: Home / Self Care | Payer: Commercial Managed Care - HMO | Source: Ambulatory Visit | Attending: Family Medicine | Admitting: Family Medicine

## 2022-07-16 ENCOUNTER — Ambulatory Visit (INDEPENDENT_AMBULATORY_CARE_PROVIDER_SITE_OTHER): Payer: Commercial Managed Care - HMO | Admitting: Family Medicine

## 2022-07-16 ENCOUNTER — Encounter: Payer: Self-pay | Admitting: Family Medicine

## 2022-07-16 VITALS — BP 108/68 | HR 91 | Temp 98.4°F | Ht 65.0 in | Wt 135.6 lb

## 2022-07-16 DIAGNOSIS — R7303 Prediabetes: Secondary | ICD-10-CM | POA: Diagnosis not present

## 2022-07-16 DIAGNOSIS — L709 Acne, unspecified: Secondary | ICD-10-CM | POA: Diagnosis not present

## 2022-07-16 DIAGNOSIS — N898 Other specified noninflammatory disorders of vagina: Secondary | ICD-10-CM | POA: Diagnosis present

## 2022-07-16 DIAGNOSIS — H538 Other visual disturbances: Secondary | ICD-10-CM | POA: Diagnosis not present

## 2022-07-16 MED ORDER — METFORMIN HCL 500 MG PO TABS: 500.0000 mg | ORAL_TABLET | Freq: Two times a day (BID) | ORAL | 1 refills | Status: DC

## 2022-07-16 MED ORDER — FLUCONAZOLE 150 MG PO TABS: 150.0000 mg | ORAL_TABLET | Freq: Once | ORAL | 0 refills | Status: AC

## 2022-07-16 NOTE — Progress Notes (Signed)
Established Patient Office Visit  Subjective   Patient ID: Sylvia Best, female    DOB: 20-Sep-1980  Age: 41 y.o. MRN: [id number]  Chief Complaint  Patient presents with   Follow-up    4 week follow up, would like to switch to Metformin to Jardiance also need referral to dermatology.     HPI for follow-up of prediabetes.  Metformin XR upset stomach.  It turns out that she is able to tolerate metformin.  She asked about using Jardiance.  Ongoing vaginal pruritus.  Needs referral to ophthalmology for eye check and referral to dermatology for facial acne.    Review of Systems  Constitutional: Negative.   HENT: Negative.    Eyes:  Positive for blurred vision. Negative for discharge and redness.  Respiratory: Negative.    Cardiovascular: Negative.   Gastrointestinal:  Negative for abdominal pain.  Genitourinary: Negative.   Musculoskeletal: Negative.  Negative for myalgias.  Skin:  Positive for rash.  Neurological:  Negative for tingling, loss of consciousness and weakness.  Endo/Heme/Allergies:  Negative for polydipsia.      Objective:     BP 108/68 (BP Location: Left Arm, Patient Position: Sitting, Cuff Size: Normal)   Pulse 91   Temp 98.4 F (36.9 C) (Temporal)   Ht '5\' 5"'$  (1.651 m)   Wt 135 lb 9.6 oz (61.5 kg)   SpO2 97%   BMI 22.57 kg/m    Physical Exam Constitutional:      General: She is not in acute distress.    Appearance: Normal appearance. She is not ill-appearing, toxic-appearing or diaphoretic.  HENT:     Head: Normocephalic and atraumatic.     Right Ear: External ear normal.     Left Ear: External ear normal.  Eyes:     General: No scleral icterus.       Right eye: No discharge.        Left eye: No discharge.     Extraocular Movements: Extraocular movements intact.     Conjunctiva/sclera: Conjunctivae normal.  Cardiovascular:     Rate and Rhythm: Normal rate.  Pulmonary:     Effort: Pulmonary effort is normal. No respiratory distress.  Skin:     General: Skin is warm and dry.       Neurological:     Mental Status: She is alert and oriented to person, place, and time.  Psychiatric:        Mood and Affect: Mood normal.        Behavior: Behavior normal.      No results found for any visits on 07/16/22.    The ASCVD Risk score (Arnett DK, et al., 2019) failed to calculate for the following reasons:   Unable to determine if patient is Non-Hispanic African American    Assessment & Plan:   Problem List Items Addressed This Visit       Musculoskeletal and Integument   Acne   Relevant Orders   Ambulatory referral to Dermatology     Genitourinary   Vaginal pruritus   Relevant Medications   fluconazole (DIFLUCAN) 150 MG tablet   Other Relevant Orders   Urine cytology ancillary only     Other   Blurred vision, bilateral   Relevant Orders   Ambulatory referral to Ophthalmology   Prediabetes - Primary   Relevant Medications   metFORMIN (GLUCOPHAGE) 500 MG tablet    No follow-ups on file.  Patient must follow-up with OB/GYN provider for further treatment if needed for vaginitis.  Gwyndolyn Saxon  Krystal Clark, MD

## 2022-07-17 ENCOUNTER — Telehealth: Payer: Self-pay | Admitting: Family Medicine

## 2022-07-17 ENCOUNTER — Other Ambulatory Visit: Payer: Self-pay

## 2022-07-17 DIAGNOSIS — N898 Other specified noninflammatory disorders of vagina: Secondary | ICD-10-CM

## 2022-07-17 LAB — URINE CYTOLOGY ANCILLARY ONLY: Comment: NEGATIVE

## 2022-07-17 NOTE — Telephone Encounter (Signed)
Caller Name: Stanton Kidney w/[facility] Call back phone #: [phone]  Reason for Call: received 2 specimens, 1 urine specimen has to be rejected, the other is a swab and didn't receive an order. Please call for clarification.

## 2022-07-17 NOTE — Telephone Encounter (Signed)
Spoke with [facility] order added for swab.

## 2022-07-18 LAB — CERVICOVAGINAL ANCILLARY ONLY
Bacterial Vaginitis (gardnerella): NEGATIVE
Candida Glabrata: NEGATIVE
Candida Vaginitis: NEGATIVE
Chlamydia: NEGATIVE
Comment: NEGATIVE
Comment: NEGATIVE
Comment: NEGATIVE
Comment: NEGATIVE
Comment: NEGATIVE
Comment: NORMAL
Neisseria Gonorrhea: NEGATIVE
Trichomonas: NEGATIVE

## 2022-07-23 ENCOUNTER — Telehealth: Payer: Self-pay | Admitting: Family Medicine

## 2022-07-23 NOTE — Telephone Encounter (Signed)
Called pharmacy asking about pt migraine medication. Was told that insurance only covered 6 tablets and advice pt to call insurance to request more and that she had 4 more tablets that she can come and pick up from the pharmancy. I informed pt the pharmacy information and she verbalized understanding.

## 2022-07-23 NOTE — Telephone Encounter (Signed)
Pt stated she never received migraine medication at the pharmacy . Can you give her a call

## 2022-08-01 ENCOUNTER — Ambulatory Visit (INDEPENDENT_AMBULATORY_CARE_PROVIDER_SITE_OTHER): Payer: Commercial Managed Care - HMO | Admitting: Sports Medicine

## 2022-08-01 VITALS — BP 102/68 | Ht 65.0 in | Wt 134.0 lb

## 2022-08-01 DIAGNOSIS — M79631 Pain in right forearm: Secondary | ICD-10-CM

## 2022-08-01 NOTE — Patient Instructions (Addendum)
We will send them a referral for you to get the nerve conduction study done. Let us know if you don't hear from them within a week or two. We will re-order your MRI to hopefully get it approved. You can keep your scheduled MRI appt on 11/28 and we'll let you know if anything changes. Please call us with any questions.  [facility] [address], [place], [place] [address] Phone: [phone]

## 2022-08-01 NOTE — Progress Notes (Signed)
   Subjective:    Patient ID: Sylvia Best, female    DOB: Aug 05, 1981, 41 y.o.   MRN: [id number]  HPI  Patient presents today for with chronic right forearm pain and swelling.  X-rays of the forearm are unremarkable.  I recommended proceeding with an MRI at her last visit but this was denied by her insurance.  She is also still waiting her appointment for an EMG/nerve conduction study.  Review of Systems As above    Objective:   Physical Exam   Well-developed, well-nourished.  No acute distress  Right forearm: There is an area of focal swelling in the volar forearm.  It is nonerythematous but is tender to patient.  Good pulses distally.     Assessment & Plan:   Chronic forearm pain and swelling of unknown etiology  We will attempt to reorder the MRI.  She has failed conservative treatment to date and the MRI is necessary to evaluate for possible soft tissue mass.  We also discovered that her EMG/nerve conduction study has not been done because she is already established with a neurology group and the recommendation was that she see them for this study.  Therefore, we will reorder it with that group.  Once I have the results of both of the studies I will follow-up with the patient via MyChart.  This note was dictated using Dragon naturally speaking software and may contain errors in syntax, spelling, or content which have not been identified prior to signing this note.

## 2022-08-12 ENCOUNTER — Other Ambulatory Visit: Payer: Self-pay | Admitting: Family Medicine

## 2022-08-12 DIAGNOSIS — G43409 Hemiplegic migraine, not intractable, without status migrainosus: Secondary | ICD-10-CM

## 2022-08-12 NOTE — Telephone Encounter (Signed)
Pt would like you to give her a call about one of her medicine

## 2022-08-13 ENCOUNTER — Other Ambulatory Visit: Payer: Commercial Managed Care - HMO

## 2022-08-13 NOTE — Telephone Encounter (Signed)
Unable to reach pt, lvm for pt to call back.  Pt did not leave name of medication called for clarification.

## 2022-08-14 ENCOUNTER — Other Ambulatory Visit: Payer: Self-pay

## 2022-08-14 DIAGNOSIS — G43409 Hemiplegic migraine, not intractable, without status migrainosus: Secondary | ICD-10-CM

## 2022-08-14 MED ORDER — ZOLMITRIPTAN 2.5 MG PO TABS: 2.5000 mg | ORAL_TABLET | Freq: Once | ORAL | 1 refills | Status: DC

## 2022-08-14 NOTE — Telephone Encounter (Signed)
Spoke with patient who sates that she needs a refill on pending Rx. Please advise

## 2022-08-14 NOTE — Telephone Encounter (Signed)
Patient aware Rx sent in

## 2022-08-14 NOTE — Telephone Encounter (Signed)
Refill request for pending Rx. Please advise

## 2022-08-29 LAB — HM DIABETES EYE EXAM

## 2022-08-30 ENCOUNTER — Encounter: Payer: Self-pay | Admitting: Family Medicine

## 2022-09-20 ENCOUNTER — Telehealth: Payer: Self-pay | Admitting: Physician Assistant

## 2022-09-20 ENCOUNTER — Other Ambulatory Visit: Payer: Self-pay | Admitting: Family Medicine

## 2022-09-20 DIAGNOSIS — J111 Influenza due to unidentified influenza virus with other respiratory manifestations: Secondary | ICD-10-CM

## 2022-09-20 DIAGNOSIS — R7303 Prediabetes: Secondary | ICD-10-CM

## 2022-09-20 MED ORDER — PROMETHAZINE-DM 6.25-15 MG/5ML PO SYRP: 5.0000 mL | ORAL_SOLUTION | Freq: Four times a day (QID) | ORAL | 0 refills | Status: DC | PRN

## 2022-09-20 MED ORDER — OSELTAMIVIR PHOSPHATE 75 MG PO CAPS: 75.0000 mg | ORAL_CAPSULE | Freq: Two times a day (BID) | ORAL | 0 refills | Status: DC

## 2022-09-20 NOTE — Progress Notes (Signed)
Virtual Visit Consent   Sylvia Best, you are scheduled for a virtual visit with a [employer] provider today. Just as with appointments in the office, your consent must be obtained to participate. Your consent will be active for this visit and any virtual visit you may have with one of our providers in the next 365 days. If you have a MyChart account, a copy of this consent can be sent to you electronically.  As this is a virtual visit, video technology does not allow for your provider to perform a traditional examination. This may limit your provider's ability to fully assess your condition. If your provider identifies any concerns that need to be evaluated in person or the need to arrange testing (such as labs, EKG, etc.), we will make arrangements to do so. Although advances in technology are sophisticated, we cannot ensure that it will always work on either your end or our end. If the connection with a video visit is poor, the visit may have to be switched to a telephone visit. With either a video or telephone visit, we are not always able to ensure that we have a secure connection.  By engaging in this virtual visit, you consent to the provision of healthcare and authorize for your insurance to be billed (if applicable) for the services provided during this visit. Depending on your insurance coverage, you may receive a charge related to this service.  I need to obtain your verbal consent now. Are you willing to proceed with your visit today? [name] has provided verbal consent on 09/20/2022 for a virtual visit (video or telephone). Mar Daring, PA-C  Date: 09/20/2022 3:52 PM  Virtual Visit via Video Note   I, Mar Daring, connected with  Sylvia Best  ([id number], 02-Sep-1981) on 09/20/22 at  3:45 PM EST by a video-enabled telemedicine application and verified that I am speaking with the correct person using two identifiers.  Location: Patient: Virtual Visit Location Patient:  Home Provider: Virtual Visit Location Provider: Home Office   I discussed the limitations of evaluation and management by telemedicine and the availability of in person appointments. The patient expressed understanding and agreed to proceed.    History of Present Illness: Sylvia Best is a 42 y.o. who identifies as a female who was assigned female at birth, and is being seen today for flu-like symptoms.  HPI: URI  This is a new problem. The current episode started yesterday. The problem has been gradually worsening. The maximum temperature recorded prior to her arrival was 101 - 101.9 F. The fever has been present for Less than 1 day. Associated symptoms include congestion, coughing, headaches, rhinorrhea and sinus pain. Pertinent negatives include no diarrhea, ear pain, nausea, plugged ear sensation, sore throat, vomiting or wheezing. She has tried inhaler use, acetaminophen and decongestant (flonase) for the symptoms. The treatment provided no relief.    Son had tested positive for influenza.  Problems:  Patient Active Problem List   Diagnosis Date Noted   Prediabetes 07/16/2022   Acne 07/16/2022   Vaginal pruritus 07/16/2022   Right wrist pain 06/18/2022   Blurred vision, bilateral 06/12/2022   Hemiplegic migraine without status migrainosus, not intractable 06/12/2022   Subarachnoid bleed ([place]) 01/04/2020   Headache 01/02/2020   Sudden onset of severe headache 01/01/2020   Healthcare maintenance 07/15/2019   Breast lump on left side at 1 o'clock position 07/15/2019   Chronic pain of right knee 12/30/2017   Abnormal x-ray of lower extremity 12/30/2017  Allergic rhinitis 10/13/2017   Pelvic pain 11/06/2016   Dyspareunia in female 11/06/2016   S/P cesarean section 04/05/2016   Orthostatic dizziness 32/95/1884   Umbilical pain 16/60/6301   Language barrier, cultural differences 10/31/2015   History of gestational diabetes 10/03/2015   AMA (advanced maternal age) multigravida 35+  10/03/2015    Allergies: No Known Allergies Medications:  Current Outpatient Medications:    oseltamivir (TAMIFLU) 75 MG capsule, Take 1 capsule (75 mg total) by mouth 2 (two) times daily., Disp: 10 capsule, Rfl: 0   promethazine-dextromethorphan (PROMETHAZINE-DM) 6.25-15 MG/5ML syrup, Take 5 mLs by mouth 4 (four) times daily as needed., Disp: 118 mL, Rfl: 0   cetirizine (ZYRTEC) 10 MG tablet, Take 10 mg by mouth daily., Disp: , Rfl:    diclofenac (VOLTAREN) 75 MG EC tablet, Take one tab twice daily with food, Disp: 60 tablet, Rfl: 0   etonogestrel (NEXPLANON) 68 MG IMPL implant, 1 each by Subdermal route once., Disp: , Rfl:    fluticasone (FLONASE) 50 MCG/ACT nasal spray, Place into both nostrils daily., Disp: , Rfl:    meloxicam (MOBIC) 7.5 MG tablet, Take 7.5 mg by mouth daily. (Patient not taking: Reported on 06/25/2022), Disp: , Rfl:    metFORMIN (GLUCOPHAGE) 500 MG tablet, Take 1 tablet (500 mg total) by mouth 2 (two) times daily with a meal., Disp: 180 tablet, Rfl: 1   metFORMIN (GLUCOPHAGE-XR) 500 MG 24 hr tablet, TAKE 1 TABLET(500 MG) BY MOUTH DAILY WITH BREAKFAST, Disp: 30 tablet, Rfl: 2   terconazole (TERAZOL 3) 0.8 % vaginal cream, Place 1 applicator vaginally at bedtime. Until finished, Disp: 20 g, Rfl: 0   ZOLMitriptan (ZOMIG) 2.5 MG tablet, Take 1 tablet (2.5 mg total) by mouth once for 1 dose. May repeat once in 2 hours if headache persists or recurs., Disp: 10 tablet, Rfl: 1  Observations/Objective: Patient is well-developed, well-nourished in no acute distress.  Resting comfortably Head is normocephalic, atraumatic.  No labored breathing.  Speech is clear and coherent with logical content.  Patient is alert and oriented at baseline.    Assessment and Plan: 1. Influenza - oseltamivir (TAMIFLU) 75 MG capsule; Take 1 capsule (75 mg total) by mouth 2 (two) times daily.  Dispense: 10 capsule; Refill: 0 - promethazine-dextromethorphan (PROMETHAZINE-DM) 6.25-15 MG/5ML syrup;  Take 5 mLs by mouth 4 (four) times daily as needed.  Dispense: 118 mL; Refill: 0  - Suspect influenza due to symptoms and positive exposure - Tamiflu prescribed - Promethazine DM for cough - Continue OTC medication of choice for symptomatic management - Push fluids - Rest - Seek in person evaluation if symptoms worsen or fail to improve   Follow Up Instructions: I discussed the assessment and treatment plan with the patient. The patient was provided an opportunity to ask questions and all were answered. The patient agreed with the plan and demonstrated an understanding of the instructions.  A copy of instructions were sent to the patient via MyChart unless otherwise noted below.    The patient was advised to call back or seek an in-person evaluation if the symptoms worsen or if the condition fails to improve as anticipated.  Time:  I spent 10 minutes with the patient via telehealth technology discussing the above problems/concerns.    Mar Daring, PA-C

## 2022-09-20 NOTE — Patient Instructions (Signed)
Leighton Ruff, thank you for joining Mar Daring, PA-C for today's virtual visit.  While this provider is not your primary care provider (PCP), if your PCP is located in our provider database this encounter information will be shared with them immediately following your visit.   [facility] account gives you access to today's visit and all your visits, tests, and labs performed at [facility] " click here if you don't have a [facility] account or go to [email or web address]  Consent: (Patient) Sylvia Best provided verbal consent for this virtual visit at the beginning of the encounter.  Current Medications:  Current Outpatient Medications:    oseltamivir (TAMIFLU) 75 MG capsule, Take 1 capsule (75 mg total) by mouth 2 (two) times daily., Disp: 10 capsule, Rfl: 0   promethazine-dextromethorphan (PROMETHAZINE-DM) 6.25-15 MG/5ML syrup, Take 5 mLs by mouth 4 (four) times daily as needed., Disp: 118 mL, Rfl: 0   cetirizine (ZYRTEC) 10 MG tablet, Take 10 mg by mouth daily., Disp: , Rfl:    diclofenac (VOLTAREN) 75 MG EC tablet, Take one tab twice daily with food, Disp: 60 tablet, Rfl: 0   etonogestrel (NEXPLANON) 68 MG IMPL implant, 1 each by Subdermal route once., Disp: , Rfl:    fluticasone (FLONASE) 50 MCG/ACT nasal spray, Place into both nostrils daily., Disp: , Rfl:    meloxicam (MOBIC) 7.5 MG tablet, Take 7.5 mg by mouth daily. (Patient not taking: Reported on 06/25/2022), Disp: , Rfl:    metFORMIN (GLUCOPHAGE) 500 MG tablet, Take 1 tablet (500 mg total) by mouth 2 (two) times daily with a meal., Disp: 180 tablet, Rfl: 1   metFORMIN (GLUCOPHAGE-XR) 500 MG 24 hr tablet, TAKE 1 TABLET(500 MG) BY MOUTH DAILY WITH BREAKFAST, Disp: 30 tablet, Rfl: 2   terconazole (TERAZOL 3) 0.8 % vaginal cream, Place 1 applicator vaginally at bedtime. Until finished, Disp: 20 g, Rfl: 0   ZOLMitriptan (ZOMIG) 2.5 MG tablet, Take 1 tablet (2.5 mg total) by mouth once for 1  dose. May repeat once in 2 hours if headache persists or recurs., Disp: 10 tablet, Rfl: 1   Medications ordered in this encounter:  Meds ordered this encounter  Medications   oseltamivir (TAMIFLU) 75 MG capsule    Sig: Take 1 capsule (75 mg total) by mouth 2 (two) times daily.    Dispense:  10 capsule    Refill:  0    Order Specific Question:   Supervising Provider    Answer:   Chase Picket [id number]   promethazine-dextromethorphan (PROMETHAZINE-DM) 6.25-15 MG/5ML syrup    Sig: Take 5 mLs by mouth 4 (four) times daily as needed.    Dispense:  118 mL    Refill:  0    Order Specific Question:   Supervising Provider    Answer:   Chase Picket [[id number]]     *If you need refills on other medications prior to your next appointment, please contact your pharmacy*  Follow-Up: Call back or seek an in-person evaluation if the symptoms worsen or if the condition fails to improve as anticipated.  [facility] [phone]  Other Instructions  Influenza, Adult Influenza, also called "the flu," is a viral infection that mainly affects the respiratory tract. This includes the lungs, nose, and throat. The flu spreads easily from person to person (is contagious). It causes common cold symptoms, along with high fever and body aches. What are the causes? This condition is caused by the influenza virus. You can  get the virus by: Breathing in droplets that are in the air from an infected person's cough or sneeze. Touching something that has the virus on it (has been contaminated) and then touching your mouth, nose, or eyes. What increases the risk? The following factors may make you more likely to get the flu: Not washing or sanitizing your hands often. Having close contact with many people during cold and flu season. Touching your mouth, eyes, or nose without first washing or sanitizing your hands. Not getting an annual flu shot. You may have a higher risk for the flu,  including serious problems, such as a lung infection (pneumonia), if you: Are older than 65. Are pregnant. Have a weakened disease-fighting system (immune system). This includes people who have HIV or AIDS, are on chemotherapy, or are taking medicines that reduce (suppress) the immune system. Have a long-term (chronic) illness, such as heart disease, kidney disease, diabetes, or lung disease. Have a liver disorder. Are severely overweight (morbidly obese). Have anemia. Have asthma. What are the signs or symptoms? Symptoms of this condition usually begin suddenly and last 4-14 days. These may include: Fever and chills. Headaches, body aches, or muscle aches. Sore throat. Cough. Runny or stuffy (congested) nose. Chest discomfort. Poor appetite. Weakness or fatigue. Dizziness. Nausea or vomiting. How is this diagnosed? This condition may be diagnosed based on: Your symptoms and medical history. A physical exam. Swabbing your nose or throat and testing the fluid for the influenza virus. How is this treated? If the flu is diagnosed early, you can be treated with antiviral medicine that is given by mouth (orally) or through an IV. This can help reduce how severe the illness is and how long it lasts. Taking care of yourself at home can help relieve symptoms. Your health care provider may recommend: Taking over-the-counter medicines. Drinking plenty of fluids. In many cases, the flu goes away on its own. If you have severe symptoms or complications, you may be treated in a hospital. Follow these instructions at home: Activity Rest as needed and get plenty of sleep. Stay home from work or school as told by your health care provider. Unless you are visiting your health care provider, avoid leaving home until your fever has been gone for 24 hours without taking medicine. Eating and drinking Take an oral rehydration solution (ORS). This is a drink that is sold at pharmacies and retail  stores. Drink enough fluid to keep your urine pale yellow. Drink clear fluids in small amounts as you are able. Clear fluids include water, ice chips, fruit juice mixed with water, and low-calorie sports drinks. Eat bland, easy-to-digest foods in small amounts as you are able. These foods include bananas, applesauce, rice, lean meats, toast, and crackers. Avoid drinking fluids that contain a lot of sugar or caffeine, such as energy drinks, regular sports drinks, and soda. Avoid alcohol. Avoid spicy or fatty foods. General instructions     Take over-the-counter and prescription medicines only as told by your health care provider. Use a cool mist humidifier to add humidity to the air in your home. This can make it easier to breathe. When using a cool mist humidifier, clean it daily. Empty the water and replace it with clean water. Cover your mouth and nose when you cough or sneeze. Wash your hands with soap and water often and for at least 20 seconds, especially after you cough or sneeze. If soap and water are not available, use alcohol-based hand sanitizer. Keep all follow-up visits.  This is important. How is this prevented?  Get an annual flu shot. This is usually available in late summer, fall, or winter. Ask your health care provider when you should get your flu shot. Avoid contact with people who are sick during cold and flu season. This is generally fall and winter. Contact a health care provider if: You develop new symptoms. You have: Chest pain. Diarrhea. A fever. Your cough gets worse. You produce more mucus. You feel nauseous or you vomit. Get help right away if you: Develop shortness of breath or have difficulty breathing. Have skin or nails that turn a bluish color. Have severe pain or stiffness in your neck. Develop a sudden headache or sudden pain in your face or ear. Cannot eat or drink without vomiting. These symptoms may represent a serious problem that is an  emergency. Do not wait to see if the symptoms will go away. Get medical help right away. Call your local emergency services (911 in the [place] Do not drive yourself to the hospital. Summary Influenza, also called "the flu," is a viral infection that primarily affects your respiratory tract. Symptoms of the flu usually begin suddenly and last 4-14 days. Getting an annual flu shot is the best way to prevent getting the flu. Stay home from work or school as told by your health care provider. Unless you are visiting your health care provider, avoid leaving home until your fever has been gone for 24 hours without taking medicine. Keep all follow-up visits. This is important. This information is not intended to replace advice given to you by your health care provider. Make sure you discuss any questions you have with your health care provider. Document Revised: 04/21/2020 Document Reviewed: 04/21/2020 Elsevier Patient Education  [employer].    If you have been instructed to have an in-person evaluation today at a local Urgent Care facility, please use the link below. It will take you to a list of all of our available [facility], including address, phone number and hours of operation. Please do not delay care.  [facility]  If you or a family member do not have a primary care provider, use the link below to schedule a visit and establish care. When you choose a [facility] primary care physician or advanced practice provider, you gain a long-term partner in health. Find a Primary Care Provider  Learn more about 's in-office and virtual care options: [facility]

## 2022-10-21 ENCOUNTER — Ambulatory Visit: Payer: Medicaid Other

## 2022-10-22 ENCOUNTER — Encounter: Payer: Self-pay | Admitting: Physician Assistant

## 2022-10-22 ENCOUNTER — Telehealth: Payer: Self-pay | Admitting: Physician Assistant

## 2022-10-22 DIAGNOSIS — J208 Acute bronchitis due to other specified organisms: Secondary | ICD-10-CM

## 2022-10-22 DIAGNOSIS — B9689 Other specified bacterial agents as the cause of diseases classified elsewhere: Secondary | ICD-10-CM

## 2022-10-22 MED ORDER — PROMETHAZINE-DM 6.25-15 MG/5ML PO SYRP: 5.0000 mL | ORAL_SOLUTION | Freq: Four times a day (QID) | ORAL | 0 refills | Status: DC | PRN

## 2022-10-22 MED ORDER — AZITHROMYCIN 250 MG PO TABS: ORAL_TABLET | ORAL | 0 refills | Status: AC

## 2022-10-22 MED ORDER — PREDNISONE 20 MG PO TABS: 20.0000 mg | ORAL_TABLET | Freq: Every day | ORAL | 0 refills | Status: DC

## 2022-10-22 MED ORDER — IBUPROFEN 600 MG PO TABS: 600.0000 mg | ORAL_TABLET | Freq: Three times a day (TID) | ORAL | 0 refills | Status: DC | PRN

## 2022-10-22 NOTE — Progress Notes (Signed)
Virtual Visit Consent   Sylvia Best, you are scheduled for a virtual visit with a [facility] provider today. Just as with appointments in the office, your consent must be obtained to participate. Your consent will be active for this visit and any virtual visit you may have with one of our providers in the next 365 days. If you have a MyChart account, a copy of this consent can be sent to you electronically.  As this is a virtual visit, video technology does not allow for your provider to perform a traditional examination. This may limit your provider's ability to fully assess your condition. If your provider identifies any concerns that need to be evaluated in person or the need to arrange testing (such as labs, EKG, etc.), we will make arrangements to do so. Although advances in technology are sophisticated, we cannot ensure that it will always work on either your end or our end. If the connection with a video visit is poor, the visit may have to be switched to a telephone visit. With either a video or telephone visit, we are not always able to ensure that we have a secure connection.  By engaging in this virtual visit, you consent to the provision of healthcare and authorize for your insurance to be billed (if applicable) for the services provided during this visit. Depending on your insurance coverage, you may receive a charge related to this service.  I need to obtain your verbal consent now. Are you willing to proceed with your visit today? Sylvia Best has provided verbal consent on 10/22/2022 for a virtual visit (video or telephone). Mar Daring, PA-C  Date: 10/22/2022 8:39 AM  Virtual Visit via Video Note   I, Mar Daring, connected with  Sylvia Best  ([id number], [birth year]) on 10/22/22 at  8:30 AM EST by a video-enabled telemedicine application and verified that I am speaking with the correct person using two identifiers.  Location: Patient: Virtual Visit Location Patient:  Home Provider: Virtual Visit Location Provider: Home Office   I discussed the limitations of evaluation and management by telemedicine and the availability of in person appointments. The patient expressed understanding and agreed to proceed.    History of Present Illness: Sylvia Best is a 42 y.o. who identifies as a female who was assigned female at birth, and is being seen today for URI symptoms with fevers. She was seen virtually on 09/20/22 and diagnosed with influenza. Treated with Tamiflu and had symptom improvement. Then over the last week or so she has started to develop cold symptoms again including, coughing (productive, green mucus), nasal congestion, headaches, itchy eyes.  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   Subarachnoid bleed ([place]) 01/04/2020   Headache 01/02/2020   Sudden onset of severe headache 01/01/2020   Healthcare maintenance 07/15/2019   Breast lump on left side at 1 o'clock position 07/15/2019   Chronic pain of right knee 12/30/2017   Abnormal x-ray of lower extremity 12/30/2017   Allergic rhinitis 10/13/2017   Pelvic pain 11/06/2016   Dyspareunia in female 11/06/2016   S/P cesarean section 04/05/2016   Orthostatic dizziness 59/56/3875   Umbilical pain 64/33/2951   Language barrier, cultural differences 10/31/2015   History of gestational diabetes 10/03/2015   AMA (  advanced maternal age) multigravida 35+ 10/03/2015    Allergies: No Known Allergies Medications:  Current Outpatient Medications:    azithromycin (ZITHROMAX) 250 MG tablet, Take 2 tablets on day 1, then 1 tablet daily on days 2 through 5, Disp: 6 tablet, Rfl: 0   ibuprofen (ADVIL) 600 MG tablet, Take 1 tablet (600 mg total) by mouth every  8 (eight) hours as needed., Disp: 30 tablet, Rfl: 0   predniSONE (DELTASONE) 20 MG tablet, Take 1 tablet (20 mg total) by mouth daily with breakfast., Disp: 5 tablet, Rfl: 0   cetirizine (ZYRTEC) 10 MG tablet, Take 10 mg by mouth daily., Disp: , Rfl:    diclofenac (VOLTAREN) 75 MG EC tablet, Take one tab twice daily with food, Disp: 60 tablet, Rfl: 0   etonogestrel (NEXPLANON) 68 MG IMPL implant, 1 each by Subdermal route once., Disp: , Rfl:    fluticasone (FLONASE) 50 MCG/ACT nasal spray, Place into both nostrils daily., Disp: , Rfl:    meloxicam (MOBIC) 7.5 MG tablet, Take 7.5 mg by mouth daily. (Patient not taking: Reported on 06/25/2022), Disp: , Rfl:    metFORMIN (GLUCOPHAGE) 500 MG tablet, Take 1 tablet (500 mg total) by mouth 2 (two) times daily with a meal., Disp: 180 tablet, Rfl: 1   metFORMIN (GLUCOPHAGE-XR) 500 MG 24 hr tablet, TAKE 1 TABLET(500 MG) BY MOUTH DAILY WITH BREAKFAST, Disp: 30 tablet, Rfl: 2   promethazine-dextromethorphan (PROMETHAZINE-DM) 6.25-15 MG/5ML syrup, Take 5 mLs by mouth 4 (four) times daily as needed., Disp: 118 mL, Rfl: 0   terconazole (TERAZOL 3) 0.8 % vaginal cream, Place 1 applicator vaginally at bedtime. Until finished, Disp: 20 g, Rfl: 0   ZOLMitriptan (ZOMIG) 2.5 MG tablet, Take 1 tablet (2.5 mg total) by mouth once for 1 dose. May repeat once in 2 hours if headache persists or recurs., Disp: 10 tablet, Rfl: 1  Observations/Objective: Patient is well-developed, well-nourished in no acute distress.  Resting comfortably at home.  Head is normocephalic, atraumatic.  No labored breathing.  Speech is clear and coherent with logical content.  Patient is alert and oriented at baseline.    Assessment and Plan: 1. Acute bacterial bronchitis - azithromycin (ZITHROMAX) 250 MG tablet; Take 2 tablets on day 1, then 1 tablet daily on days 2 through 5  Dispense: 6 tablet; Refill: 0 - promethazine-dextromethorphan (PROMETHAZINE-DM) 6.25-15 MG/5ML syrup; Take 5 mLs  by mouth 4 (four) times daily as needed.  Dispense: 118 mL; Refill: 0 - predniSONE (DELTASONE) 20 MG tablet; Take 1 tablet (20 mg total) by mouth daily with breakfast.  Dispense: 5 tablet; Refill: 0 - ibuprofen (ADVIL) 600 MG tablet; Take 1 tablet (600 mg total) by mouth every 8 (eight) hours as needed.  Dispense: 30 tablet; Refill: 0  - Worsening over a week despite OTC medications - Will treat with Z-pack, Prednisone, and Promethazine DM - Can continue Mucinex  - Push fluids.  - Rest.  - Steam and humidifier can help - Seek in person evaluation if worsening or symptoms fail to improve    Follow Up Instructions: I discussed the assessment and treatment plan with the patient. The patient was provided an opportunity to ask questions and all were answered. The patient agreed with the plan and demonstrated an understanding of the instructions.  A copy of instructions were sent to the patient via MyChart unless otherwise noted below.   Patient has requested to receive PHI (AVS, Work Notes, etc) pertaining to this video visit through e-mail as they are  currently without active MyChart. They have voiced understand that email is not considered secure and their health information could be viewed by someone other than the patient.   The patient was advised to call back or seek an in-person evaluation if the symptoms worsen or if the condition fails to improve as anticipated.  Time:  I spent 12 minutes with the patient via telehealth technology discussing the above problems/concerns.    Mar Daring, PA-C

## 2022-10-22 NOTE — Patient Instructions (Signed)
Leighton Ruff, thank you for joining Mar Daring, PA-C for today's virtual visit.  While this provider is not your primary care provider (PCP), if your PCP is located in our provider database this encounter information will be shared with them immediately following your visit.   [facility] account gives you access to today's visit and all your visits, tests, and labs performed at [facility] " click here if you don't have a [facility] account or go to [email or web address]  Consent: (Patient) Sylvia Best provided verbal consent for this virtual visit at the beginning of the encounter.  Current Medications:  Current Outpatient Medications:    azithromycin (ZITHROMAX) 250 MG tablet, Take 2 tablets on day 1, then 1 tablet daily on days 2 through 5, Disp: 6 tablet, Rfl: 0   ibuprofen (ADVIL) 600 MG tablet, Take 1 tablet (600 mg total) by mouth every 8 (eight) hours as needed., Disp: 30 tablet, Rfl: 0   predniSONE (DELTASONE) 20 MG tablet, Take 1 tablet (20 mg total) by mouth daily with breakfast., Disp: 5 tablet, Rfl: 0   cetirizine (ZYRTEC) 10 MG tablet, Take 10 mg by mouth daily., Disp: , Rfl:    diclofenac (VOLTAREN) 75 MG EC tablet, Take one tab twice daily with food, Disp: 60 tablet, Rfl: 0   etonogestrel (NEXPLANON) 68 MG IMPL implant, 1 each by Subdermal route once., Disp: , Rfl:    fluticasone (FLONASE) 50 MCG/ACT nasal spray, Place into both nostrils daily., Disp: , Rfl:    meloxicam (MOBIC) 7.5 MG tablet, Take 7.5 mg by mouth daily. (Patient not taking: Reported on 06/25/2022), Disp: , Rfl:    metFORMIN (GLUCOPHAGE) 500 MG tablet, Take 1 tablet (500 mg total) by mouth 2 (two) times daily with a meal., Disp: 180 tablet, Rfl: 1   metFORMIN (GLUCOPHAGE-XR) 500 MG 24 hr tablet, TAKE 1 TABLET(500 MG) BY MOUTH DAILY WITH BREAKFAST, Disp: 30 tablet, Rfl: 2   promethazine-dextromethorphan (PROMETHAZINE-DM) 6.25-15 MG/5ML syrup, Take 5 mLs by mouth 4  (four) times daily as needed., Disp: 118 mL, Rfl: 0   terconazole (TERAZOL 3) 0.8 % vaginal cream, Place 1 applicator vaginally at bedtime. Until finished, Disp: 20 g, Rfl: 0   ZOLMitriptan (ZOMIG) 2.5 MG tablet, Take 1 tablet (2.5 mg total) by mouth once for 1 dose. May repeat once in 2 hours if headache persists or recurs., Disp: 10 tablet, Rfl: 1   Medications ordered in this encounter:  Meds ordered this encounter  Medications   azithromycin (ZITHROMAX) 250 MG tablet    Sig: Take 2 tablets on day 1, then 1 tablet daily on days 2 through 5    Dispense:  6 tablet    Refill:  0    Order Specific Question:   Supervising Provider    Answer:   Chase Picket [[id number]]   promethazine-dextromethorphan (PROMETHAZINE-DM) 6.25-15 MG/5ML syrup    Sig: Take 5 mLs by mouth 4 (four) times daily as needed.    Dispense:  118 mL    Refill:  0    Order Specific Question:   Supervising Provider    Answer:   Chase Picket [[id number]]   predniSONE (DELTASONE) 20 MG tablet    Sig: Take 1 tablet (20 mg total) by mouth daily with breakfast.    Dispense:  5 tablet    Refill:  0    Order Specific Question:   Supervising Provider    Answer:   Chase Picket [id number]   ibuprofen (  ADVIL) 600 MG tablet    Sig: Take 1 tablet (600 mg total) by mouth every 8 (eight) hours as needed.    Dispense:  30 tablet    Refill:  0    Order Specific Question:   Supervising Provider    Answer:   Chase Picket [id number]     *If you need refills on other medications prior to your next appointment, please contact your pharmacy*  Follow-Up: Call back or seek an in-person evaluation if the symptoms worsen or if the condition fails to improve as anticipated.  [place] [phone]  Other Instructions  Acute Bronchitis, Adult  Acute bronchitis is sudden inflammation of the main airways (bronchi) that come off the windpipe (trachea) in the lungs. The swelling causes the airways to get smaller  and make more mucus than normal. This can make it hard to breathe and can cause coughing or noisy breathing (wheezing). Acute bronchitis may last several weeks. The cough may last longer. Allergies, asthma, and exposure to smoke may make the condition worse. What are the causes? This condition can be caused by germs and by substances that irritate the lungs, including: Cold and flu viruses. The most common cause of this condition is the virus that causes the common cold. Bacteria. This is less common. Breathing in substances that irritate the lungs, including: Smoke from cigarettes and other forms of tobacco. Dust and pollen. Fumes from household cleaning products, gases, or burned fuel. Indoor or outdoor air pollution. What increases the risk? The following factors may make you more likely to develop this condition: A weak body's defense system, also called the immune system. A condition that affects your lungs and breathing, such as asthma. What are the signs or symptoms? Common symptoms of this condition include: Coughing. This may bring up clear, yellow, or green mucus from your lungs (sputum). Wheezing. Runny or stuffy nose. Having too much mucus in your lungs (chest congestion). Shortness of breath. Aches and pains, including sore throat or chest. How is this diagnosed? This condition is usually diagnosed based on: Your symptoms and medical history. A physical exam. You may also have other tests, including tests to rule out other conditions, such as pneumonia. These tests include: A test of lung function. Test of a mucus sample to look for the presence of bacteria. Tests to check the oxygen level in your blood. Blood tests. Chest X-ray. How is this treated? Most cases of acute bronchitis clear up over time without treatment. Your health care provider may recommend: Drinking more fluids to help thin your mucus so it is easier to cough up. Taking inhaled medicine (inhaler) to  improve air flow in and out of your lungs. Using a vaporizer or a humidifier. These are machines that add water to the air to help you breathe better. Taking a medicine that thins mucus and clears congestion (expectorant). Taking a medicine that prevents or stops coughing (cough suppressant). It is not common to take an antibiotic medicine for this condition. Follow these instructions at home:  Take over-the-counter and prescription medicines only as told by your health care provider. Use an inhaler, vaporizer, or humidifier as told by your health care provider. Take two teaspoons (10 mL) of honey at bedtime to lessen coughing at night. Drink enough fluid to keep your urine pale yellow. Do not use any products that contain nicotine or tobacco. These products include cigarettes, chewing tobacco, and vaping devices, such as e-cigarettes. If you need help quitting,  ask your health care provider. Get plenty of rest. Return to your normal activities as told by your health care provider. Ask your health care provider what activities are safe for you. Keep all follow-up visits. This is important. How is this prevented? To lower your risk of getting this condition again: Wash your hands often with soap and water for at least 20 seconds. If soap and water are not available, use hand sanitizer. Avoid contact with people who have cold symptoms. Try not to touch your mouth, nose, or eyes with your hands. Avoid breathing in smoke or chemical fumes. Breathing smoke or chemical fumes will make your condition worse. Get the flu shot every year. Contact a health care provider if: Your symptoms do not improve after 2 weeks. You have trouble coughing up the mucus. Your cough keeps you awake at night. You have a fever. Get help right away if you: Cough up blood. Feel pain in your chest. Have severe shortness of breath. Faint or keep feeling like you are going to faint. Have a severe headache. Have a  fever or chills that get worse. These symptoms may represent a serious problem that is an emergency. Do not wait to see if the symptoms will go away. Get medical help right away. Call your local emergency services (911 in the [place] Do not drive yourself to the hospital. Summary Acute bronchitis is inflammation of the main airways (bronchi) that come off the windpipe (trachea) in the lungs. The swelling causes the airways to get smaller and make more mucus than normal. Drinking more fluids can help thin your mucus so it is easier to cough up. Take over-the-counter and prescription medicines only as told by your health care provider. Do not use any products that contain nicotine or tobacco. These products include cigarettes, chewing tobacco, and vaping devices, such as e-cigarettes. If you need help quitting, ask your health care provider. Contact a health care provider if your symptoms do not improve after 2 weeks. This information is not intended to replace advice given to you by your health care provider. Make sure you discuss any questions you have with your health care provider. Document Revised: 12/13/2021 Document Reviewed: 01/03/2021 Elsevier Patient Education  [place].    If you have been instructed to have an in-person evaluation today at a local Urgent Care facility, please use the link below. It will take you to a list of all of our available [facility], including address, phone number and hours of operation. Please do not delay care.  [facility]  If you or a family member do not have a primary care provider, use the link below to schedule a visit and establish care. When you choose a [facility] primary care physician or advanced practice provider, you gain a long-term partner in health. Find a Primary Care Provider  Learn more about [place] [facility]'s in-office and virtual care options: [facility] Now

## 2022-11-29 ENCOUNTER — Telehealth: Payer: Self-pay | Admitting: Physician Assistant

## 2022-11-29 ENCOUNTER — Other Ambulatory Visit: Payer: Self-pay

## 2022-11-29 ENCOUNTER — Emergency Department (HOSPITAL_COMMUNITY): Payer: 59

## 2022-11-29 ENCOUNTER — Emergency Department (HOSPITAL_COMMUNITY)
Admission: EM | Admit: 2022-11-29 | Discharge: 2022-11-29 | Disposition: A | Discharge status: Home / Self Care | Payer: 59 | Attending: Emergency Medicine | Admitting: Emergency Medicine

## 2022-11-29 ENCOUNTER — Encounter (HOSPITAL_COMMUNITY): Payer: Self-pay

## 2022-11-29 DIAGNOSIS — J45909 Unspecified asthma, uncomplicated: Secondary | ICD-10-CM | POA: Insufficient documentation

## 2022-11-29 DIAGNOSIS — Z1152 Encounter for screening for COVID-19: Secondary | ICD-10-CM | POA: Diagnosis not present

## 2022-11-29 DIAGNOSIS — E119 Type 2 diabetes mellitus without complications: Secondary | ICD-10-CM | POA: Insufficient documentation

## 2022-11-29 DIAGNOSIS — R079 Chest pain, unspecified: Secondary | ICD-10-CM | POA: Diagnosis present

## 2022-11-29 DIAGNOSIS — M2559 Pain in other specified joint: Secondary | ICD-10-CM | POA: Diagnosis not present

## 2022-11-29 DIAGNOSIS — Z7984 Long term (current) use of oral hypoglycemic drugs: Secondary | ICD-10-CM | POA: Diagnosis not present

## 2022-11-29 DIAGNOSIS — R0789 Other chest pain: Secondary | ICD-10-CM

## 2022-11-29 DIAGNOSIS — M25511 Pain in right shoulder: Secondary | ICD-10-CM

## 2022-11-29 DIAGNOSIS — M5441 Lumbago with sciatica, right side: Secondary | ICD-10-CM

## 2022-11-29 DIAGNOSIS — R52 Pain, unspecified: Secondary | ICD-10-CM

## 2022-11-29 LAB — CBC
HCT: 40.5 % (ref 36.0–46.0)
Hemoglobin: 13.8 g/dL (ref 12.0–15.0)
MCH: 31.7 pg (ref 26.0–34.0)
MCHC: 34.1 g/dL (ref 30.0–36.0)
MCV: 92.9 fL (ref 80.0–100.0)
Platelets: 243 10*3/uL (ref 150–400)
RBC: 4.36 MIL/uL (ref 3.87–5.11)
RDW: 12.3 % (ref 11.5–15.5)
WBC: 5.5 10*3/uL (ref 4.0–10.5)
nRBC: 0 % (ref 0.0–0.2)

## 2022-11-29 LAB — RESP PANEL BY RT-PCR (RSV, FLU A&B, COVID)  RVPGX2
Influenza A by PCR: NEGATIVE
Influenza B by PCR: NEGATIVE
Resp Syncytial Virus by PCR: NEGATIVE
SARS Coronavirus 2 by RT PCR: NEGATIVE

## 2022-11-29 LAB — HEPATIC FUNCTION PANEL
ALT: 15 U/L (ref 0–44)
AST: 14 U/L — ABNORMAL LOW (ref 15–41)
Albumin: 4.1 g/dL (ref 3.5–5.0)
Alkaline Phosphatase: 49 U/L (ref 38–126)
Bilirubin, Direct: 0.1 mg/dL (ref 0.0–0.2)
Indirect Bilirubin: 0.8 mg/dL (ref 0.3–0.9)
Total Bilirubin: 0.9 mg/dL (ref 0.3–1.2)
Total Protein: 7.2 g/dL (ref 6.5–8.1)

## 2022-11-29 LAB — BASIC METABOLIC PANEL
Anion gap: 5 (ref 5–15)
BUN: 5 mg/dL — ABNORMAL LOW (ref 6–20)
CO2: 25 mmol/L (ref 22–32)
Calcium: 8.9 mg/dL (ref 8.9–10.3)
Chloride: 104 mmol/L (ref 98–111)
Creatinine, Ser: 0.61 mg/dL (ref 0.44–1.00)
GFR, Estimated: >60 mL/min (ref >60)
Glucose, Bld: 97 mg/dL (ref 70–99)
Potassium: 3.2 mmol/L — ABNORMAL LOW (ref 3.5–5.1)
Sodium: 134 mmol/L — ABNORMAL LOW (ref 135–145)

## 2022-11-29 LAB — TROPONIN I (HIGH SENSITIVITY)
Troponin I (High Sensitivity): <2 ng/L (ref <18)
Troponin I (High Sensitivity): <2 ng/L (ref <18)

## 2022-11-29 LAB — MAGNESIUM: Magnesium: 2 mg/dL (ref 1.7–2.4)

## 2022-11-29 LAB — CK: Total CK: 40 U/L (ref 38–234)

## 2022-11-29 LAB — PROTIME-INR
INR: 1.1 (ref 0.8–1.2)
Prothrombin Time: 13.7 seconds (ref 11.4–15.2)

## 2022-11-29 LAB — D-DIMER, QUANTITATIVE: D-Dimer, Quant: <0.27 ug/mL-FEU (ref 0.00–0.50)

## 2022-11-29 LAB — I-STAT BETA HCG BLOOD, ED (MC, WL, AP ONLY): I-stat hCG, quantitative: <5 m[IU]/mL (ref <5)

## 2022-11-29 MED ORDER — IBUPROFEN 200 MG PO TABS
600.0000 mg | ORAL_TABLET | Freq: Once | ORAL | Status: AC
  Administered 2022-11-29: 600 mg via ORAL
  Filled 2022-11-29: qty 3

## 2022-11-29 MED ORDER — METOCLOPRAMIDE HCL 5 MG/ML IJ SOLN
10.0000 mg | Freq: Once | INTRAMUSCULAR | Status: AC
  Administered 2022-11-29: 10 mg via INTRAVENOUS
  Filled 2022-11-29: qty 2

## 2022-11-29 MED ORDER — ACETAMINOPHEN 500 MG PO TABS
1000.0000 mg | ORAL_TABLET | Freq: Once | ORAL | Status: AC
  Administered 2022-11-29: 1000 mg via ORAL
  Filled 2022-11-29: qty 2

## 2022-11-29 MED ORDER — POTASSIUM CHLORIDE CRYS ER 20 MEQ PO TBCR
40.0000 meq | EXTENDED_RELEASE_TABLET | Freq: Once | ORAL | Status: AC
  Administered 2022-11-29: 40 meq via ORAL
  Filled 2022-11-29: qty 2

## 2022-11-29 MED ORDER — MORPHINE SULFATE (PF) 4 MG/ML IV SOLN
4.0000 mg | Freq: Once | INTRAVENOUS | Status: AC
  Administered 2022-11-29: 4 mg via INTRAVENOUS
  Filled 2022-11-29: qty 1

## 2022-11-29 MED ORDER — DIPHENHYDRAMINE HCL 25 MG PO CAPS
25.0000 mg | ORAL_CAPSULE | Freq: Once | ORAL | Status: AC
  Administered 2022-11-29: 25 mg via ORAL
  Filled 2022-11-29: qty 1

## 2022-11-29 MED ORDER — PREDNISONE 10 MG (21) PO TBPK: ORAL_TABLET | ORAL | 0 refills | Status: DC

## 2022-11-29 NOTE — Progress Notes (Signed)
Virtual Visit Consent   Sylvia Best, you are scheduled for a virtual visit with a [facility] provider today. Just as with appointments in the office, your consent must be obtained to participate. Your consent will be active for this visit and any virtual visit you may have with one of our providers in the next 365 days. If you have a MyChart account, a copy of this consent can be sent to you electronically.  As this is a virtual visit, video technology does not allow for your provider to perform a traditional examination. This may limit your provider's ability to fully assess your condition. If your provider identifies any concerns that need to be evaluated in person or the need to arrange testing (such as labs, EKG, etc.), we will make arrangements to do so. Although advances in technology are sophisticated, we cannot ensure that it will always work on either your end or our end. If the connection with a video visit is poor, the visit may have to be switched to a telephone visit. With either a video or telephone visit, we are not always able to ensure that we have a secure connection.  By engaging in this virtual visit, you consent to the provision of healthcare and authorize for your insurance to be billed (if applicable) for the services provided during this visit. Depending on your insurance coverage, you may receive a charge related to this service.  I need to obtain your verbal consent now. Are you willing to proceed with your visit today? Sylvia Best has provided verbal consent on 11/29/2022 for a virtual visit (video or telephone). Leeanne Rio, [place]  Date: 11/29/2022 2:08 PM  Virtual Visit via Video Note   I, Leeanne Rio, connected with  Sylvia Best  ([id number], September 24, 1980) on 11/29/22 at  1:45 PM EDT by a video-enabled telemedicine application and verified that I am speaking with the correct person using two identifiers.  Location: Patient: Virtual Visit Location Patient:  Home Provider: Virtual Visit Location Provider: Home Office   I discussed the limitations of evaluation and management by telemedicine and the availability of in person appointments. The patient expressed understanding and agreed to proceed.    History of Present Illness: Sylvia Best is a 42 y.o. who identifies as a female who was assigned female at birth, and is being seen today for 3 days of pain in her right hip and lower back, radiating down her right lower extremity.  Denies numbness, tingling or weakness.  Notes pain is worse when trying to ambulate but she is able to ambulate.  Denies change to bowel or bladder habits.  Patient also starting to notice a bit of pain in her right shoulder that wants to travel down her hand.  Denies any trauma or injury.  Denies history of similar issue.  Has taken some over-the-counter Tylenol and an old muscle relaxer without much relief.  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   Subarachnoid bleed ([place]) 01/04/2020   Headache 01/02/2020   Sudden onset of severe headache 01/01/2020   Healthcare maintenance 07/15/2019   Breast lump on left side at 1 o'clock position 07/15/2019   Chronic pain of right  knee 12/30/2017   Abnormal x-ray of lower extremity 12/30/2017   Allergic rhinitis 10/13/2017   Pelvic pain 11/06/2016   Dyspareunia in female 11/06/2016   S/P cesarean section 04/05/2016   Orthostatic dizziness [id number]   Umbilical pain [id number]   Language barrier, cultural differences 10/31/2015   History of gestational diabetes 10/03/2015   AMA (advanced maternal age) multigravida 35+ 10/03/2015    Allergies: No Known  Allergies Medications:  Current Outpatient Medications:    predniSONE (STERAPRED UNI-PAK 21 TAB) 10 MG (21) TBPK tablet, Take following package directions, Disp: 21 tablet, Rfl: 0   cetirizine (ZYRTEC) 10 MG tablet, Take 10 mg by mouth daily., Disp: , Rfl:    diclofenac (VOLTAREN) 75 MG EC tablet, Take one tab twice daily with food, Disp: 60 tablet, Rfl: 0   etonogestrel (NEXPLANON) 68 MG IMPL implant, 1 each by Subdermal route once., Disp: , Rfl:    fluticasone (FLONASE) 50 MCG/ACT nasal spray, Place into both nostrils daily., Disp: , Rfl:    meloxicam (MOBIC) 7.5 MG tablet, Take 7.5 mg by mouth daily. (Patient not taking: Reported on 06/25/2022), Disp: , Rfl:    metFORMIN (GLUCOPHAGE) 500 MG tablet, Take 1 tablet (500 mg total) by mouth 2 (two) times daily with a meal., Disp: 180 tablet, Rfl: 1   metFORMIN (GLUCOPHAGE-XR) 500 MG 24 hr tablet, TAKE 1 TABLET(500 MG) BY MOUTH DAILY WITH BREAKFAST, Disp: 30 tablet, Rfl: 2   terconazole (TERAZOL 3) 0.8 % vaginal cream, Place 1 applicator vaginally at bedtime. Until finished, Disp: 20 g, Rfl: 0   ZOLMitriptan (ZOMIG) 2.5 MG tablet, Take 1 tablet (2.5 mg total) by mouth once for 1 dose. May repeat once in 2 hours if headache persists or recurs., Disp: 10 tablet, Rfl: 1  Observations/Objective: Patient is well-developed, well-nourished in no acute distress.  Resting comfortably at home.  Head is normocephalic, atraumatic.  No labored breathing. Speech is clear and coherent with logical content.  Patient is alert and oriented at baseline.  Able to demonstrate range of motion of R shoulder.  Assessment and Plan: 1. Acute right-sided low back pain with right-sided sciatica - predniSONE (STERAPRED UNI-PAK 21 TAB) 10 MG (21) TBPK tablet; Take following package directions  Dispense: 21 tablet; Refill: 0  2. Acute pain of right shoulder - predniSONE (STERAPRED UNI-PAK 21 TAB) 10 MG (21) TBPK tablet; Take following package directions  Dispense: 21  tablet; Refill: 0  For low back pain, okay to continue Tylenol OTC.  Start heating pad.  Will prescribe prednisone tablet pack to try to reduce inflammation and calm nerve endings.  Last A1c was 6.4, so this seems reasonable.  Steroid should also help with the mild shoulder pain she is having.  She is to avoid heavy lifting or overhead lifting.  She needs an in person evaluation if symptoms or not quickly improving/resolving.  Follow Up Instructions: I discussed the assessment and treatment plan with the patient. The patient was provided an opportunity to ask questions and all were answered. The patient agreed with the plan and demonstrated an understanding of the instructions.  A copy of instructions were sent to the patient via MyChart unless otherwise noted below.   The patient was advised to call back or seek an in-person evaluation if the symptoms worsen or if the condition fails to improve as anticipated.  Time:  I spent 10 minutes with the patient via telehealth technology discussing the above problems/concerns.    Leeanne Rio, PA-C

## 2022-11-29 NOTE — Patient Instructions (Signed)
Sylvia Best, thank you for joining Sylvia Rio, PA-C for today's virtual visit.  While this provider is not your primary care provider (PCP), if your PCP is located in our provider database this encounter information will be shared with them immediately following your visit.   [facility] account gives you access to today's visit and all your visits, tests, and labs performed at [facility] " click here if you don't have a [facility] account or go to [email or web address]  Consent: (Patient) Sylvia Best provided verbal consent for this virtual visit at the beginning of the encounter.  Current Medications:  Current Outpatient Medications:    cetirizine (ZYRTEC) 10 MG tablet, Take 10 mg by mouth daily., Disp: , Rfl:    diclofenac (VOLTAREN) 75 MG EC tablet, Take one tab twice daily with food, Disp: 60 tablet, Rfl: 0   etonogestrel (NEXPLANON) 68 MG IMPL implant, 1 each by Subdermal route once., Disp: , Rfl:    fluticasone (FLONASE) 50 MCG/ACT nasal spray, Place into both nostrils daily., Disp: , Rfl:    ibuprofen (ADVIL) 600 MG tablet, Take 1 tablet (600 mg total) by mouth every 8 (eight) hours as needed., Disp: 30 tablet, Rfl: 0   meloxicam (MOBIC) 7.5 MG tablet, Take 7.5 mg by mouth daily. (Patient not taking: Reported on 06/25/2022), Disp: , Rfl:    metFORMIN (GLUCOPHAGE) 500 MG tablet, Take 1 tablet (500 mg total) by mouth 2 (two) times daily with a meal., Disp: 180 tablet, Rfl: 1   metFORMIN (GLUCOPHAGE-XR) 500 MG 24 hr tablet, TAKE 1 TABLET(500 MG) BY MOUTH DAILY WITH BREAKFAST, Disp: 30 tablet, Rfl: 2   predniSONE (DELTASONE) 20 MG tablet, Take 1 tablet (20 mg total) by mouth daily with breakfast., Disp: 5 tablet, Rfl: 0   promethazine-dextromethorphan (PROMETHAZINE-DM) 6.25-15 MG/5ML syrup, Take 5 mLs by mouth 4 (four) times daily as needed., Disp: 118 mL, Rfl: 0   terconazole (TERAZOL 3) 0.8 % vaginal cream, Place 1 applicator vaginally at  bedtime. Until finished, Disp: 20 g, Rfl: 0   ZOLMitriptan (ZOMIG) 2.5 MG tablet, Take 1 tablet (2.5 mg total) by mouth once for 1 dose. May repeat once in 2 hours if headache persists or recurs., Disp: 10 tablet, Rfl: 1   Medications ordered in this encounter:  No orders of the defined types were placed in this encounter.    *If you need refills on other medications prior to your next appointment, please contact your pharmacy*  Follow-Up: Call back or seek an in-person evaluation if the symptoms worsen or if the condition fails to improve as anticipated.  [facility] [phone]  Other Instructions Please avoid heavy lifting or overhead lifting. Try to shift positions that you are sitting and every so often, to keep the pressure off of your right hip and buttock. Okay to continue Tylenol over-the-counter. I recommend a heating pad to lower back and right hip for 15 minutes at a time. Take the prednisone tablet pack as directed. I want you to schedule an in person follow-up for early next week with your primary care provider. If anything acutely worsens over the weekend, you need to be evaluated in person at an urgent care or ER.   If you have been instructed to have an in-person evaluation today at a local Urgent Care facility, please use the link below. It will take you to a list of all of our available [facility], including address, phone number and hours  of operation. Please do not delay care.  [facility]  If you or a family member do not have a primary care provider, use the link below to schedule a visit and establish care. When you choose a [facility] primary care physician or advanced practice provider, you gain a long-term partner in health. Find a Primary Care Provider  Learn more about [facility]'s in-office and virtual care options: [facility] Now

## 2022-11-29 NOTE — ED Provider Notes (Incomplete)
[place] EMERGENCY DEPARTMENT AT [facility] Provider Note   CSN: [id number] Arrival date & time: 11/29/22  1652     History {Add pertinent medical, surgical, social history, OB history to HPI:1} Chief Complaint  Patient presents with   Chest Pain    Dimples Gerrity is a 42 y.o. female.   Chest Pain  Patient is a 42 year old She has a past medical history significant for reflux, DM2, gestational diabetes, asthma  History of chronic wrist pain, vaginal pruritus, pelvic pain, dyspareunia  Patient presents emergency room today with bilateral leg pain.         Home Medications Prior to Admission medications   Medication Sig Start Date End Date Taking? Authorizing Provider  cetirizine (ZYRTEC) 10 MG tablet Take 10 mg by mouth daily. 05/09/22   [provider]  diclofenac (VOLTAREN) 75 MG EC tablet Take one tab twice daily with food 06/18/22   Thurman Coyer, DO  etonogestrel (NEXPLANON) 68 MG IMPL implant 1 each by Subdermal route once.    [provider]  fluticasone (FLONASE) 50 MCG/ACT nasal spray Place into both nostrils daily.    [provider]  meloxicam (MOBIC) 7.5 MG tablet Take 7.5 mg by mouth daily. Patient not taking: Reported on 06/25/2022 06/09/20   [provider]  metFORMIN (GLUCOPHAGE) 500 MG tablet Take 1 tablet (500 mg total) by mouth 2 (two) times daily with a meal. 07/16/22   Libby Maw, MD  metFORMIN (GLUCOPHAGE-XR) 500 MG 24 hr tablet TAKE 1 TABLET(500 MG) BY MOUTH DAILY WITH BREAKFAST 09/20/22   Libby Maw, MD  predniSONE (STERAPRED UNI-PAK 21 TAB) 10 MG (21) TBPK tablet Take following package directions 11/29/22   Brunetta Jeans, PA-C  terconazole (TERAZOL 3) 0.8 % vaginal cream Place 1 applicator vaginally at bedtime. Until finished 06/25/22   Libby Maw, MD  ZOLMitriptan (ZOMIG) 2.5 MG tablet Take 1 tablet (2.5 mg total) by mouth once for 1 dose. May repeat once in 2  hours if headache persists or recurs. 08/14/22 08/14/22  Libby Maw, MD      Allergies    Patient has no known allergies.    Review of Systems   Review of Systems  Cardiovascular:  Positive for chest pain.    Physical Exam Updated Vital Signs BP 101/64   Pulse 81   Temp 98.4 F (36.9 C)   Resp 17   Ht 5\' 4"  (1.626 m)   Wt 65.1 kg   SpO2 99%   BMI 24.65 kg/m  Physical Exam Vitals and nursing note reviewed.  Constitutional:      General: She is in acute distress.     Comments: 42 year old woman initially thrashing in the bed in discomfort but with consolation is able to sit and answer questions and follow commands.  Oriented x 3  HENT:     Head: Normocephalic and atraumatic.     Nose: Nose normal.     Mouth/Throat:     Mouth: Mucous membranes are moist.  Eyes:     General: No scleral icterus. Cardiovascular:     Rate and Rhythm: Normal rate and regular rhythm.     Pulses: Normal pulses.     Heart sounds: Normal heart sounds.     Comments: Bilateral lower extremities with DP PT pulses sensation is normal  Bilateral upper extremities with symmetric and easily palpable radial artery pulses cap refill in bilateral index fingers less than 2 seconds Pulmonary:  Effort: Pulmonary effort is normal. No respiratory distress.     Breath sounds: Normal breath sounds. No wheezing.     Comments: Respiratory rate normal after given reassurance by me Abdominal:     Palpations: Abdomen is soft.     Tenderness: There is no abdominal tenderness. There is no guarding or rebound.  Musculoskeletal:     Cervical back: Normal range of motion.     Right lower leg: No edema.     Left lower leg: No edema.  Skin:    General: Skin is warm and dry.     Capillary Refill: Capillary refill takes less than 2 seconds.  Neurological:     Mental Status: She is alert. Mental status is at baseline.  Psychiatric:        Mood and Affect: Mood normal.     Comments: Behaviors strange,  however seems euthymic     ED Results / Procedures / Treatments   Labs (all labs ordered are listed, but only abnormal results are displayed) Labs Reviewed  BASIC METABOLIC PANEL - Abnormal; Notable for the following components:      Result Value   Sodium 134 (*)    Potassium 3.2 (*)    BUN 5 (*)    All other components within normal limits  HEPATIC FUNCTION PANEL - Abnormal; Notable for the following components:   AST 14 (*)    All other components within normal limits  RESP PANEL BY RT-PCR (RSV, FLU A&B, COVID)  RVPGX2  CBC  PROTIME-INR  MAGNESIUM  CK  D-DIMER, QUANTITATIVE  I-STAT BETA HCG BLOOD, ED (MC, [facility], AP ONLY)  TROPONIN I (HIGH SENSITIVITY)  TROPONIN I (HIGH SENSITIVITY)    EKG EKG Interpretation  Date/Time:  Friday November 29 2022 17:20:10 EDT Ventricular Rate:  96 PR Interval:  156 QRS Duration: 72 QT Interval:  344 QTC Calculation: 434 R Axis:   145 Text Interpretation: Normal sinus rhythm Left posterior fascicular block When compared with ECG of 03-Jan-2020 22:24, PREVIOUS ECG IS PRESENT Nonspecific T wave inversion in lead II, no STEMI Confirmed by Octaviano Glow [phone]) on 11/29/2022 6:53:31 PM  Radiology DG Chest Port 1 View  Result Date: 11/29/2022 CLINICAL DATA:  Chest pain EXAM: PORTABLE CHEST 1 VIEW COMPARISON:  Chest radiograph dated 03/06/2012. FINDINGS: The heart size and mediastinal contours are within normal limits. Both lungs are clear. The visualized skeletal structures are unremarkable. IMPRESSION: No active disease. Electronically Signed   By: Zerita Boers M.D.   On: 11/29/2022 18:37    Procedures Procedures  {Document cardiac monitor, telemetry assessment procedure when appropriate:1}  Medications Ordered in ED Medications  acetaminophen (TYLENOL) tablet 1,000 mg (has no administration in time range)  ibuprofen (ADVIL) tablet 600 mg (has no administration in time range)  metoCLOPramide (REGLAN) injection 10 mg (10 mg Intravenous Given  11/29/22 1951)  diphenhydrAMINE (BENADRYL) capsule 25 mg (25 mg Oral Given 11/29/22 1950)  morphine (PF) 4 MG/ML injection 4 mg (4 mg Intravenous Given 11/29/22 2037)  potassium chloride SA (KLOR-CON M) CR tablet 40 mEq (40 mEq Oral Given 11/29/22 2036)    ED Course/ Medical Decision Making/ A&P   {   Click here for ABCD2, HEART and other calculatorsREFRESH Note before signing :1}                          Medical Decision Making Amount and/or Complexity of Data Reviewed Labs: ordered.  Risk OTC drugs. Prescription drug management.   ***  {  Document critical care time when appropriate:1} {Document review of labs and clinical decision tools ie heart score, Chads2Vasc2 etc:1}  {Document your independent review of radiology images, and any outside records:1} {Document your discussion with family members, caretakers, and with consultants:1} {Document social determinants of health affecting pt's care:1} {Document your decision making why or why not admission, treatments were needed:1} Final Clinical Impression(s) / ED Diagnoses Final diagnoses:  Atypical chest pain  Migratory pain    Rx / DC Orders ED Discharge Orders     None

## 2022-11-29 NOTE — ED Triage Notes (Signed)
States that she has been having chest pain since last night, not it is radiating into both legs.

## 2022-11-29 NOTE — Discharge Instructions (Addendum)
Please follow-up with your primary care doctor.  You may always return to emergency room if your pain returns.  Recommend taking Tylenol Motrin as discussed below for this a couple doses.  Make sure you are hydrating and eating regular meals  Please use Tylenol or ibuprofen for pain.  You may use 600 mg ibuprofen every 6 hours or 1000 mg of Tylenol every 6 hours.  You may choose to alternate between the 2.  This would be most effective.  Not to exceed 4 g of Tylenol within 24 hours.  Not to exceed 3200 mg ibuprofen 24 hours.

## 2022-11-29 NOTE — ED Provider Triage Note (Signed)
Emergency Medicine Provider Triage Evaluation Note  Sylvia Best , a 42 y.o. female  was evaluated in triage.  Pt complains of CP, HA, bilat lower extremity pain RLE > LLE . Started acutely over night, has been constant and worsening.  No vision changes, no history of PE or dissection.  Denies vision changes.  Review of Systems  Per HPI  Physical Exam  BP 127/81 (BP Location: Left Arm)   Pulse (!) 102   Temp 98.4 F (36.9 C) (Oral)   Resp 18   Ht 5\' 4"  (1.626 m)   Wt 65.1 kg   SpO2 100%   BMI 24.65 kg/m  Gen:   Awake, no distress   Resp:  Normal effort  MSK:   Moves extremities without difficulty  Other:  Patient appears uncomfortable,  Extremity pulses are symmetric, mildly tachycardic.  Medical Decision Making  Medically screening exam initiated at 6:08 PM.  Appropriate orders placed.  Sylvia Best was informed that the remainder of the evaluation will be completed by another provider, this initial triage assessment does not replace that evaluation, and the importance of remaining in the ED until their evaluation is complete.     Sherrill Raring, PA-C 11/29/22 1810

## 2022-12-02 ENCOUNTER — Telehealth: Payer: Self-pay

## 2022-12-02 NOTE — Transitions of Care (Post Inpatient/ED Visit) (Signed)
   12/02/2022  Name: Sylvia Best MRN: [id number] DOB: 18-Nov-1980  Today's TOC FU Call Status: Today's TOC FU Call Status:: Successful TOC FU Call Competed TOC FU Call Complete Date: 12/02/22  Transition Care Management Follow-up Telephone Call Date of Discharge: 11/29/22 Discharge Facility: [facility]) Type of Discharge: Emergency Department Reason for ED Visit:  (atypical chedt pain) How have you been since you were released from the hospital?: Better Any questions or concerns?: No  Items Reviewed: Did you receive and understand the discharge instructions provided?: Yes Medications obtained and verified?: No Medications Not Reviewed Reasons:: Other: (none ordered) Any new allergies since your discharge?: No Dietary orders reviewed?: No Do you have support at home?: Yes  Home Care and Equipment/Supplies: [place] Ordered?: NA Any new equipment or medical supplies ordered?: NA  Functional Questionnaire: Do you need assistance with bathing/showering or dressing?: No Do you need assistance with meal preparation?: No Do you need assistance with eating?: No Do you have difficulty maintaining continence: No Do you need assistance with getting out of bed/getting out of a chair/moving?: No Do you have difficulty managing or taking your medications?: No  Follow up appointments reviewed: PCP Follow-up appointment confirmed?: Yes Date of PCP follow-up appointment?: 12/04/22 Follow-up Provider: Dr. Gena Fray [facility] Follow-up appointment confirmed?: NA Do you need transportation to your follow-up appointment?: No Do you understand care options if your condition(s) worsen?: Yes-patient verbalized understanding    SIGNATURE Angeline Slim, BSN, RN

## 2022-12-03 ENCOUNTER — Ambulatory Visit: Payer: Medicaid Other | Admitting: Family Medicine

## 2022-12-04 ENCOUNTER — Other Ambulatory Visit: Payer: Self-pay | Admitting: Family Medicine

## 2022-12-04 ENCOUNTER — Encounter: Payer: Self-pay | Admitting: Family Medicine

## 2022-12-04 ENCOUNTER — Ambulatory Visit (INDEPENDENT_AMBULATORY_CARE_PROVIDER_SITE_OTHER): Payer: 59 | Admitting: Family Medicine

## 2022-12-04 VITALS — BP 106/68 | HR 85 | Temp 98.0°F | Ht 64.0 in | Wt 130.0 lb

## 2022-12-04 DIAGNOSIS — M79601 Pain in right arm: Secondary | ICD-10-CM | POA: Diagnosis not present

## 2022-12-04 DIAGNOSIS — R7303 Prediabetes: Secondary | ICD-10-CM | POA: Diagnosis not present

## 2022-12-04 DIAGNOSIS — E876 Hypokalemia: Secondary | ICD-10-CM

## 2022-12-04 DIAGNOSIS — B3731 Acute candidiasis of vulva and vagina: Secondary | ICD-10-CM

## 2022-12-04 DIAGNOSIS — E538 Deficiency of other specified B group vitamins: Secondary | ICD-10-CM

## 2022-12-04 DIAGNOSIS — M79604 Pain in right leg: Secondary | ICD-10-CM

## 2022-12-04 DIAGNOSIS — N761 Subacute and chronic vaginitis: Secondary | ICD-10-CM

## 2022-12-04 LAB — BASIC METABOLIC PANEL
BUN: 8 mg/dL (ref 6–23)
CO2: 30 mEq/L (ref 19–32)
Calcium: 9.3 mg/dL (ref 8.4–10.5)
Chloride: 99 mEq/L (ref 96–112)
Creatinine, Ser: 0.57 mg/dL (ref 0.40–1.20)
GFR: 112.06 mL/min (ref >60.00)
Glucose, Bld: 186 mg/dL — ABNORMAL HIGH (ref 70–99)
Potassium: 3.9 mEq/L (ref 3.5–5.1)
Sodium: 137 mEq/L (ref 135–145)

## 2022-12-04 LAB — HEMOGLOBIN A1C: Hgb A1c MFr Bld: 6.5 % (ref 4.6–6.5)

## 2022-12-04 LAB — VITAMIN B12: Vitamin B-12: 352 pg/mL (ref 211–911)

## 2022-12-04 MED ORDER — NAPROXEN 500 MG PO TABS: 500.0000 mg | ORAL_TABLET | Freq: Two times a day (BID) | ORAL | 0 refills | Status: DC

## 2022-12-04 NOTE — ED Provider Notes (Incomplete)
[place] EMERGENCY DEPARTMENT AT [facility] Provider Note   CSN: [id number] Arrival date & time: 11/29/22  1652     History {Add pertinent medical, surgical, social history, OB history to HPI:1} Chief Complaint  Patient presents with  . Chest Pain    Sylvia Best is a 42 y.o. female.   Chest Pain Patient is a 42 year old female with a past medical history significant for reflux, gestational diabetes, asthma, DM2, chronic right knee pain, hemiplegic migraines  She is present emergency room today with complaints of chest pain since yesterday evening she states however that her pain in her chest is completely gone now.  She states that she has not had any nausea or shortness of breath.  No lightheadedness or dizziness syncope or near syncope.  She states that the pain that she was experiencing in her chest with sharp and stabbing did not seem to be related to her breathing.  She denies any hemoptysis.  She states that it stopped hurting several hours ago however she did have a couple episodes of moments of discomfort in her chest since that time.  She states that the pain in her chest has moved around to different areas including right upper chest and left lower chest.  She states that nothing seems to aggravate or ameliorate her symptoms.  She denies any history of recreational drug use, alcohol, smoking history.  She states that both of her legs feel very achy and tired.  She states that she has been having cramping in her legs.  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  No recent surgeries, hospitalization, long travel, hemoptysis, estrogen containing OCP, cancer history.  No unilateral leg swelling.  No history of PE or VTE.      Home Medications Prior to Admission medications   Medication Sig Start Date End Date Taking? Authorizing Provider  cetirizine (ZYRTEC) 10 MG tablet Take 10 mg by  mouth daily. 05/09/22   [provider]  etonogestrel (NEXPLANON) 68 MG IMPL implant 1 each by Subdermal route once.    [provider]  fluticasone (FLONASE) 50 MCG/ACT nasal spray Place into both nostrils daily.    [provider]  metFORMIN (GLUCOPHAGE) 500 MG tablet Take 1 tablet (500 mg total) by mouth 2 (two) times daily with a meal. 07/16/22   Libby Maw, MD  metFORMIN (GLUCOPHAGE-XR) 500 MG 24 hr tablet TAKE 1 TABLET(500 MG) BY MOUTH DAILY WITH BREAKFAST 09/20/22   Libby Maw, MD  naproxen (NAPROSYN) 500 MG tablet Take 1 tablet (500 mg total) by mouth 2 (two) times daily with a meal. 12/04/22   Haydee Salter, MD  predniSONE (STERAPRED UNI-PAK 21 TAB) 10 MG (21) TBPK tablet Take following package directions 11/29/22   Brunetta Jeans, PA-C  terconazole (TERAZOL 3) 0.8 % vaginal cream Place 1 applicator vaginally at bedtime. Until finished 06/25/22   Libby Maw, MD  ZOLMitriptan (ZOMIG) 2.5 MG tablet Take 1 tablet (2.5 mg total) by mouth once for 1 dose. May repeat once in 2 hours if headache persists or recurs. 08/14/22 12/04/22  Libby Maw, MD      Allergies    Patient has no known allergies.    Review of Systems   Review of Systems  Cardiovascular:  Positive for chest pain.    Physical Exam Updated Vital Signs BP 95/74   Pulse 68   Temp 98.4  F (36.9 C)   Resp 17   Ht 5\' 4"  (1.626 m)   Wt 65.1 kg   SpO2 98%   BMI 24.65 kg/m  Physical Exam Vitals and nursing note reviewed.  Constitutional:      General: She is not in acute distress.    Comments: Pleasant, tearful 42 year old female.  Appears uncomfortable but nontoxic.  Able to answer questions appropriately follow commands  HENT:     Head: Normocephalic and atraumatic.     Nose: Nose normal.  Eyes:     General: No scleral icterus. Cardiovascular:     Rate and Rhythm: Normal rate and regular rhythm.     Pulses: Normal pulses.     Heart sounds:  Normal heart sounds.     Comments: Heart rate is regular and rate between 60 and 80 Pulmonary:     Effort: Pulmonary effort is normal. No respiratory distress.     Breath sounds: No wheezing.  Abdominal:     Palpations: Abdomen is soft.     Tenderness: There is no abdominal tenderness. There is no guarding or rebound.  Musculoskeletal:     Cervical back: Normal range of motion.     Right lower leg: No edema.     Left lower leg: No edema.  Skin:    General: Skin is warm and dry.     Capillary Refill: Capillary refill takes less than 2 seconds.  Neurological:     Mental Status: She is alert. Mental status is at baseline.  Psychiatric:        Mood and Affect: Mood normal.        Behavior: Behavior normal.     ED Results / Procedures / Treatments   Labs (all labs ordered are listed, but only abnormal results are displayed) Labs Reviewed  BASIC METABOLIC PANEL - Abnormal; Notable for the following components:      Result Value   Sodium 134 (*)    Potassium 3.2 (*)    BUN 5 (*)    All other components within normal limits  HEPATIC FUNCTION PANEL - Abnormal; Notable for the following components:   AST 14 (*)    All other components within normal limits  RESP PANEL BY RT-PCR (RSV, FLU A&B, COVID)  RVPGX2  CBC  PROTIME-INR  MAGNESIUM  CK  D-DIMER, QUANTITATIVE  I-STAT BETA HCG BLOOD, ED (MC, [facility], AP ONLY)  TROPONIN I (HIGH SENSITIVITY)  TROPONIN I (HIGH SENSITIVITY)    EKG EKG Interpretation  Date/Time:  Friday November 29 2022 17:20:10 EDT Ventricular Rate:  96 PR Interval:  156 QRS Duration: 72 QT Interval:  344 QTC Calculation: 434 R Axis:   145 Text Interpretation: Normal sinus rhythm Left posterior fascicular block When compared with ECG of 03-Jan-2020 22:24, PREVIOUS ECG IS PRESENT Nonspecific T wave inversion in lead II, no STEMI Confirmed by Octaviano Glow [phone]) on 11/29/2022 6:53:31 PM  Radiology No results found.  Procedures Procedures  {Document cardiac  monitor, telemetry assessment procedure when appropriate:1}  Medications Ordered in ED Medications  metoCLOPramide (REGLAN) injection 10 mg (10 mg Intravenous Given 11/29/22 1951)  diphenhydrAMINE (BENADRYL) capsule 25 mg (25 mg Oral Given 11/29/22 1950)  morphine (PF) 4 MG/ML injection 4 mg (4 mg Intravenous Given 11/29/22 2037)  potassium chloride SA (KLOR-CON M) CR tablet 40 mEq (40 mEq Oral Given 11/29/22 2036)  acetaminophen (TYLENOL) tablet 1,000 mg (1,000 mg Oral Given 11/29/22 2225)  ibuprofen (ADVIL) tablet 600 mg (600 mg Oral Given 11/29/22 2225)  ED Course/ Medical Decision Making/ A&P   {   Click here for ABCD2, HEART and other calculatorsREFRESH Note before signing :1}                          Medical Decision Making Amount and/or Complexity of Data Reviewed Labs: ordered.  Risk OTC drugs. Prescription drug management.   ***  {Document critical care time when appropriate:1} {Document review of labs and clinical decision tools ie heart score, Chads2Vasc2 etc:1}  {Document your independent review of radiology images, and any outside records:1} {Document your discussion with family members, caretakers, and with consultants:1} {Document social determinants of health affecting pt's care:1} {Document your decision making why or why not admission, treatments were needed:1} Final Clinical Impression(s) / ED Diagnoses Final diagnoses:  Atypical chest pain  Migratory pain    Rx / DC Orders ED Discharge Orders     None

## 2022-12-04 NOTE — Progress Notes (Signed)
[facility] [address] [place] [address] Dept: [phone] Dept Fax: [phone]  Office Visit  Subjective:    Patient ID: Sylvia Best, female    DOB: 03/26/1981, 42 y.o..   MRN: [id number]  Chief Complaint  Patient presents with   Hospitalization [facility] f/u for chest pain.  Still having pain in arm/legs.     History of Present Illness:  Patient is in today for follow-up from a recent Ed visit. Sylvia Best was seen at [facility] on 3/15 with chest pain. The ED note is not available. She apparently did not have evidence of an acute cardiac event. she notes they found her potassium level to be low. In addition, she is concerned that this was related to diabetes. She had a strong family history of diabetes and notes she is afraid of developing this. She is currently managed on metformin daily and has a diagnosis of prediabetes. Sylvia Best notes that she is having pain in the entire right side of her body, esp.t he right arm and leg. She works for [employer] in [employer], so does regular lifting. She has gone back to work since her ED visit.  Sylvia Best also notes she is having issues with recurrent vaginitis with vaginal itching. She notes she has been evaluated by her PCP in the past and was treated with creams and antibiotics. she has tried using an OTC vaginal cream for her current symptoms./  Past Medical History: Patient Active Problem List   Diagnosis Date Noted   Prediabetes 07/16/2022   Acne 07/16/2022   Vaginal pruritus 07/16/2022   Right wrist pain 06/18/2022   Blurred vision, bilateral 06/12/2022   Hemiplegic migraine without status migrainosus, not intractable 06/12/2022   Subarachnoid bleed ([place]) 01/04/2020   Headache 01/02/2020   Sudden onset of severe headache 01/01/2020   Healthcare maintenance 07/15/2019   Breast lump on left side at 1 o'clock position 07/15/2019   Chronic pain of right knee  12/30/2017   Abnormal x-ray of lower extremity 12/30/2017   Allergic rhinitis 10/13/2017   Pelvic pain 11/06/2016   Dyspareunia in female 11/06/2016   S/P cesarean section 04/05/2016   Orthostatic dizziness [id number]   Umbilical pain [id number]   Language barrier, cultural differences 10/31/2015   History of gestational diabetes 10/03/2015   AMA (advanced maternal age) multigravida 35+ 10/03/2015   Past Surgical History:  Procedure Laterality Date   CESAREAN SECTION  04/26/2011   Procedure: CESAREAN SECTION;  Surgeon: Donnamae Jude, MD;  Location: [facility];  Service: Gynecology;  Laterality: N/A;   CESAREAN SECTION N/A 04/05/2016   Procedure: CESAREAN SECTION;  Surgeon: Osborne Oman, MD;  Location: [facility];  Service: Obstetrics;  Laterality: N/A;   DILATION AND CURETTAGE OF UTERUS     IR ANGIO INTRA EXTRACRAN SEL COM CAROTID INNOMINATE BILAT MOD SED  01/04/2020   IR ANGIO VERTEBRAL SEL VERTEBRAL BILAT MOD SED  01/04/2020   IR US GUIDE VASC ACCESS RIGHT  01/04/2020   TONSILLECTOMY     age 78   Family History  Problem Relation Age of Onset   Hypertension Mother    Heart disease Father    Diabetes Father    Diabetes Paternal Grandfather    Outpatient Medications Prior to Visit  Medication Sig Dispense Refill   cetirizine (ZYRTEC) 10 MG tablet Take 10 mg by mouth daily.     etonogestrel (NEXPLANON) 68 MG IMPL implant 1 each by Subdermal  route once.     fluticasone (FLONASE) 50 MCG/ACT nasal spray Place into both nostrils daily.     metFORMIN (GLUCOPHAGE) 500 MG tablet Take 1 tablet (500 mg total) by mouth 2 (two) times daily with a meal. 180 tablet 1   metFORMIN (GLUCOPHAGE-XR) 500 MG 24 hr tablet TAKE 1 TABLET(500 MG) BY MOUTH DAILY WITH BREAKFAST 30 tablet 2   predniSONE (STERAPRED UNI-PAK 21 TAB) 10 MG (21) TBPK tablet Take following package directions 21 tablet 0   terconazole (TERAZOL 3) 0.8 % vaginal cream Place 1 applicator vaginally at bedtime. Until finished 20 g  0   ZOLMitriptan (ZOMIG) 2.5 MG tablet Take 1 tablet (2.5 mg total) by mouth once for 1 dose. May repeat once in 2 hours if headache persists or recurs. 10 tablet 1   diclofenac (VOLTAREN) 75 MG EC tablet Take one tab twice daily with food (Patient not taking: Reported on 12/04/2022) 60 tablet 0   meloxicam (MOBIC) 7.5 MG tablet Take 7.5 mg by mouth daily. (Patient not taking: Reported on 06/25/2022)     No facility-administered medications prior to visit.   No Known Allergies   Objective:   Today's Vitals   12/04/22 0909  BP: 106/68  Pulse: 85  Temp: 98 F (36.7 C)  TempSrc: Temporal  SpO2: 99%  Weight: 130 lb (59 kg)  Height: 5\' 4"  (1.626 m)   Body mass index is 22.31 kg/m.   General: Well developed, well nourished. No acute distress. Lungs: Clear to auscultation bilaterally. No wheezing, rales or rhonchi. CV: RRR without murmurs or rubs. Pulses 2+ bilaterally. Extremities: Full ROM. No joint swelling or tenderness. No edema noted. Strength 5/5 Neuro: CN II-XII intact. Normal sensation of UE and LE and DTR 2+ bilaterally. Psych: Alert and oriented. Normal mood and affect.  Health Maintenance Due  Topic Date Due   Hepatitis C Screening  Never done     Assessment & Plan:  1. Right arm pain 2. Right leg pain The etiology of her right-sided pain is unclear. There is no sign of a neurological impairment and she has no sign of a myositis or arthritis condition. I recommend she take naproxen 500 mg bid for a week and see if this will resolve.  - naproxen (NAPROSYN) 500 MG tablet; Take 1 tablet (500 mg total) by mouth 2 (two) times daily with a meal.  Dispense: 14 tablet; Refill: 0  3. Hypokalemia Sylvia Best's potassium was mildly low int he ED. I will repeat a potassium level to reassess. Hypokalemia could be a source of cramping and cause some of the right-sided muscle pain.  - Basic metabolic panel  4. Prediabetes Due to her concerns about possible diabetes, I will check  an A1c. She should continue on metformin 500 mg. She states she is taking this twice a day.  - Hemoglobin [id number] - Basic metabolic panel  5. Vitamin B12 deficiency Sylvia Best states she receives Vitamin B12 injections. She is at risk for B12 deficiency being on metformin. I will recheck this, as it could contribute to nerve dysfunction.  - Vitamin B12  6. Chronic vaginitis There was not time to evaluate this complaint today. I asked her to reschedule an appointment to allow for an appropriate pelvic exam and vaginal swab to determine an appropriate course of treatment.  Return for [place] appointment for evaluation of vaginitis.   Haydee Salter, MD

## 2022-12-04 NOTE — Assessment & Plan Note (Signed)
Reviewed labs from ED visit.

## 2022-12-04 NOTE — ED Provider Notes (Addendum)
[facility] AT [facility] Provider Note   CSN: [id number] Arrival date & time: 11/29/22  1652     History  Chief Complaint  Patient presents with   Chest Pain    Sylvia Best is a 42 y.o. female.   Chest Pain Patient is a 42 year old female with a past medical history significant for reflux, gestational diabetes, asthma, DM2, chronic right knee pain, hemiplegic migraines  She is present emergency room today with complaints of chest pain since yesterday evening she states however that her pain in her chest is completely gone now.  She states that she has not had any nausea or shortness of breath.  No lightheadedness or dizziness syncope or near syncope.  She states that the pain that she was experiencing in her chest with sharp and stabbing did not seem to be related to her breathing.  She denies any hemoptysis.  She states that it stopped hurting several hours ago however she did have a couple episodes of moments of discomfort in her chest since that time.  She states that the pain in her chest has moved around to different areas including right upper chest and left lower chest.  She states that nothing seems to aggravate or ameliorate her symptoms.  She denies any history of recreational drug use, alcohol, smoking history.  She states that both of her legs feel very achy and tired.  She states that she has been having cramping in her legs.  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  No recent surgeries, hospitalization, long travel, hemoptysis, estrogen containing OCP, cancer history.  No unilateral leg swelling.  No history of PE or VTE.      Home Medications Prior to Admission medications   Medication Sig Start Date End Date Taking? Authorizing Provider  cetirizine (ZYRTEC) 10 MG tablet Take 10 mg by mouth daily. 05/09/22   [provider]  etonogestrel  (NEXPLANON) 68 MG IMPL implant 1 each by Subdermal route once.    [provider]  fluticasone (FLONASE) 50 MCG/ACT nasal spray Place into both nostrils daily.    [provider]  metFORMIN (GLUCOPHAGE) 500 MG tablet Take 1 tablet (500 mg total) by mouth 2 (two) times daily with a meal. 07/16/22   Libby Maw, MD  metFORMIN (GLUCOPHAGE-XR) 500 MG 24 hr tablet TAKE 1 TABLET(500 MG) BY MOUTH DAILY WITH BREAKFAST 09/20/22   Libby Maw, MD  naproxen (NAPROSYN) 500 MG tablet Take 1 tablet (500 mg total) by mouth 2 (two) times daily with a meal. 12/04/22   Haydee Salter, MD  predniSONE (STERAPRED UNI-PAK 21 TAB) 10 MG (21) TBPK tablet Take following package directions 11/29/22   Brunetta Jeans, PA-C  terconazole (TERAZOL 3) 0.8 % vaginal cream Place 1 applicator vaginally at bedtime. Until finished 06/25/22   Libby Maw, MD  ZOLMitriptan (ZOMIG) 2.5 MG tablet Take 1 tablet (2.5 mg total) by mouth once for 1 dose. May repeat once in 2 hours if headache persists or recurs. 08/14/22 12/04/22  Libby Maw, MD      Allergies    Patient has no known allergies.    Review of Systems   Review of Systems  Cardiovascular:  Positive for chest pain.    Physical Exam Updated Vital Signs BP 95/74   Pulse 68   Temp 98.4 F (36.9 C)   Resp 17  Ht 5\' 4"  (1.626 m)   Wt 65.1 kg   SpO2 98%   BMI 24.65 kg/m  Physical Exam Vitals and nursing note reviewed.  Constitutional:      General: She is in acute distress.     Comments: 42 year old woman initially thrashing in the bed in discomfort but with consolation is able to sit and answer questions and follow commands.  Oriented x 3  HENT:     Head: Normocephalic and atraumatic.     Nose: Nose normal.     Mouth/Throat:     Mouth: Mucous membranes are moist.  Eyes:     General: No scleral icterus. Cardiovascular:     Rate and Rhythm: Normal rate and regular rhythm.     Pulses: Normal pulses.      Heart sounds: Normal heart sounds.     Comments: Bilateral lower extremities with DP PT pulses sensation is normal  Bilateral upper extremities with symmetric and easily palpable radial artery pulses cap refill in bilateral index fingers less than 2 seconds Pulmonary:     Effort: Pulmonary effort is normal. No respiratory distress.     Breath sounds: Normal breath sounds. No wheezing.     Comments: Respiratory rate normal after given reassurance by me Abdominal:     Palpations: Abdomen is soft.     Tenderness: There is no abdominal tenderness. There is no guarding or rebound.  Musculoskeletal:     Cervical back: Normal range of motion.     Right lower leg: No edema.     Left lower leg: No edema.  Skin:    General: Skin is warm and dry.     Capillary Refill: Capillary refill takes less than 2 seconds.  Neurological:     Mental Status: She is alert. Mental status is at baseline.  Psychiatric:        Mood and Affect: Mood normal.     Comments: Behaviors strange, however seems euthymic   ED Results / Procedures / Treatments   Labs (all labs ordered are listed, but only abnormal results are displayed) Labs Reviewed  BASIC METABOLIC PANEL - Abnormal; Notable for the following components:      Result Value   Sodium 134 (*)    Potassium 3.2 (*)    BUN 5 (*)    All other components within normal limits  HEPATIC FUNCTION PANEL - Abnormal; Notable for the following components:   AST 14 (*)    All other components within normal limits  RESP PANEL BY RT-PCR (RSV, FLU A&B, COVID)  RVPGX2  CBC  PROTIME-INR  MAGNESIUM  CK  D-DIMER, QUANTITATIVE  I-STAT BETA HCG BLOOD, ED ([facility], [facility], [facility] ONLY)  TROPONIN I (HIGH SENSITIVITY)  TROPONIN I (HIGH SENSITIVITY)    EKG EKG Interpretation  Date/Time:  Friday November 29 2022 17:20:10 EDT Ventricular Rate:  96 PR Interval:  156 QRS Duration: 72 QT Interval:  344 QTC Calculation: 434 R Axis:   145 Text Interpretation: Normal sinus rhythm  Left posterior fascicular block When compared with ECG of 03-Jan-2020 22:24, PREVIOUS ECG IS PRESENT Nonspecific T wave inversion in lead II, no STEMI Confirmed by Octaviano Glow [phone]) on 11/29/2022 6:53:31 PM  Radiology No results found.  Procedures Procedures    Medications Ordered in ED Medications  metoCLOPramide (REGLAN) injection 10 mg (10 mg Intravenous Given 11/29/22 1951)  diphenhydrAMINE (BENADRYL) capsule 25 mg (25 mg Oral Given 11/29/22 1950)  morphine (PF) 4 MG/ML injection 4 mg (4 mg Intravenous  Given 11/29/22 2037)  potassium chloride SA (KLOR-CON M) CR tablet 40 mEq (40 mEq Oral Given 11/29/22 2036)  acetaminophen (TYLENOL) tablet 1,000 mg (1,000 mg Oral Given 11/29/22 2225)  ibuprofen (ADVIL) tablet 600 mg (600 mg Oral Given 11/29/22 2225)    ED Course/ Medical Decision Making/ A&P                             Medical Decision Making Amount and/or Complexity of Data Reviewed Labs: ordered.  Risk OTC drugs. Prescription drug management.   This patient presents to the ED for concern of chest pain BL leg pain, this involves a number of treatment options, and is a complaint that carries with it a moderate to high risk of complications and morbidity. A differential diagnosis was considered for the patient's symptoms which is discussed below:   The emergent causes of chest pain include: Acute coronary syndrome, tamponade, pericarditis/myocarditis, aortic dissection, pulmonary embolism, tension pneumothorax, pneumonia, and esophageal rupture.   I do not believe the patient has an emergent cause of chest pain, other urgent/non-acute considerations include, but are not limited to: chronic angina, aortic stenosis, cardiomyopathy, mitral valve prolapse, pulmonary hypertension, aortic insufficiency, right ventricular hypertrophy, pleuritis, bronchitis, pneumothorax, tumor, gastroesophageal reflux disease (GERD), esophageal spasm, Mallory-Weiss syndrome, peptic ulcer disease,  pancreatitis, functional gastrointestinal pain, cervical or thoracic disk disease or arthritis, shoulder arthritis, costochondritis, subacromial bursitis, anxiety or panic attack, herpes zoster, breast disorders, chest wall tumors, thoracic outlet syndrome, mediastinitis.     Co morbidities: Discussed in HPI   Brief History:  Patient is a 42 year old female with a past medical history significant for reflux, gestational diabetes, asthma, DM2, chronic right knee pain, hemiplegic migraines  She is present emergency room today with complaints of chest pain since yesterday evening she states however that her pain in her chest is completely gone now.  She states that she has not had any nausea or shortness of breath.  No lightheadedness or dizziness syncope or near syncope.  She states that the pain that she was experiencing in her chest with sharp and stabbing did not seem to be related to her breathing.  She denies any hemoptysis.  She states that it stopped hurting several hours ago however she did have a couple episodes of moments of discomfort in her chest since that time.  She states that the pain in her chest has moved around to different areas including right upper chest and left lower chest.  She states that nothing seems to aggravate or ameliorate her symptoms.  She denies any history of recreational drug use, alcohol, smoking history.  She states that both of her legs feel very achy and tired.  She states that she has been having cramping in her legs.  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  No recent surgeries, hospitalization, long travel, hemoptysis, estrogen containing OCP, cancer history.  No unilateral leg swelling.  No history of PE or VTE.     EMR reviewed including pt PMHx, past surgical history and past visits to ER.   See HPI for more details   Lab Tests:   I personally reviewed all  laboratory work and imaging. Metabolic panel without any acute abnormality specifically kidney function within normal limits and no significant electrolyte abnormalities. CBC without leukocytosis or significant anemia. The K is slightly low which was repeated.  Mildly low sodium  of 134 likely some dehydration.  LFTs normal CBC without leukocytosis or anemia COVID influenza RSV negative.  Ice ECG negative pregnancy.  Normal coags troponin undetectably low and no active chest pain now.  D-dimer undetectably low CK within normal limits magnesium normal.   Imaging Studies:  NAD. I personally reviewed all imaging studies and no acute abnormality found. I agree with radiology interpretation.    Cardiac Monitoring:  The patient was maintained on a cardiac monitor.  I personally viewed and interpreted the cardiac monitored which showed an underlying rhythm of: NSR EKG non-ischemic   Medicines ordered:  I ordered medication including Tylenol, ibuprofen, morphine, potassium p.o., Reglan, Benadryl for headache nausea diffuse pain Reevaluation of the patient after these medicines showed that the patient improved I have reviewed the patients home medicines and have made adjustments as needed   Critical Interventions:     Consults/Attending Physician      Reevaluation:  After the interventions noted above I re-evaluated patient and found that they have :resolved  Repeat exam Physical Exam Vitals and nursing note reviewed.  Constitutional:      General: She is not in acute distress.    Comments: Pleasant, tearful 42 year old female.  Appears uncomfortable but nontoxic.  Able to answer questions appropriately follow commands  HENT:     Head: Normocephalic and atraumatic.     Nose: Nose normal.  Eyes:     General: No scleral icterus. Cardiovascular:     Rate and Rhythm: Normal rate and regular rhythm.     Pulses: Normal pulses.     Heart sounds: Normal heart sounds.     Comments:  Heart rate is regular and rate between 60 and 80 Pulmonary:     Effort: Pulmonary effort is normal. No respiratory distress.     Breath sounds: No wheezing.  Abdominal:     Palpations: Abdomen is soft.     Tenderness: There is no abdominal tenderness. There is no guarding or rebound.  Musculoskeletal:     Cervical back: Normal range of motion.     Right lower leg: No edema.     Left lower leg: No edema.  Skin:    General: Skin is warm and dry.     Capillary Refill: Capillary refill takes less than 2 seconds.  Neurological:     Mental Status: She is alert. Mental status is at baseline.  Psychiatric:        Mood and Affect: Mood normal.        Behavior: Behavior normal.   Social Determinants of Health:      Problem List / ED Course:  Patient with multiple areas of pain notably did have chest pain earlier but this is completely resolved.  Did have a headache and bilateral leg pain.  No strenuous exercise but did obtain CK which is normal normal kidney function slightly dehydrated appearing on exam and on lab work.  No urinary symptoms therefore no indication for UA.  Patient is not tachycardic and I have a low suspicion for PE causing her chest pain which is now resolved D-dimer undetectably low I do not believe that any additional workup is necessary to rule out PE at this time.  COVID influenza RSV negative she does have some viral symptoms and her presentation after additional history was taken specifically some sinus congestion and occasional cough.  No hemoptysis. Bilateral leg pain unlikely to be bilateral DVTs.  D-dimer also negative which although this is not a validated tool her presentation  is not consistent with DVTs and a very low suspicion for this.  Distally neurovascularly intact, ambulatory and her symptoms have completely resolved after headache cocktail of Reglan and Benadryl Tylenol and ibuprofen.  She is feeling completely well.  Tolerating p.o. and ambulatory.  Will  discharge home at this time with follow-up with her PCP.   Dispostion:  After consideration of the diagnostic results and the patients response to treatment, I feel that the patent would benefit from close outpatient follow-up.  Return precautions discussed    Final Clinical Impression(s) / ED Diagnoses Final diagnoses:  Atypical chest pain  Migratory pain    Rx / DC Orders ED Discharge Orders     None         Tedd Sias, [place] 12/05/22 0416    Tedd Sias, PA 12/05/22 0417    Drenda Freeze, MD 12/05/22 [phone]

## 2022-12-05 MED ORDER — TERCONAZOLE 0.8 % VA CREA: 1.0000 | TOPICAL_CREAM | Freq: Every day | VAGINAL | 0 refills | Status: AC

## 2022-12-06 NOTE — Telephone Encounter (Signed)
Patient notified that Rx sent into pharmacy. 

## 2022-12-16 ENCOUNTER — Other Ambulatory Visit: Payer: Self-pay | Admitting: Family Medicine

## 2022-12-16 DIAGNOSIS — R7303 Prediabetes: Secondary | ICD-10-CM

## 2023-01-17 ENCOUNTER — Other Ambulatory Visit: Payer: Self-pay | Admitting: Family Medicine

## 2023-01-17 DIAGNOSIS — R7303 Prediabetes: Secondary | ICD-10-CM

## 2023-01-21 ENCOUNTER — Ambulatory Visit: Payer: Medicaid Other

## 2023-01-28 ENCOUNTER — Ambulatory Visit: Payer: Medicaid Other

## 2023-01-28 ENCOUNTER — Telehealth: Payer: Self-pay

## 2023-01-28 NOTE — Telephone Encounter (Signed)
Telephoned patient at mobile number and left a voice message with [facility] (scholarship) contact information.

## 2023-02-04 ENCOUNTER — Ambulatory Visit: Payer: Medicaid Other

## 2023-02-04 NOTE — Progress Notes (Signed)
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 Patient will return to office to have physical form filled out with [facility].

## 2023-02-13 ENCOUNTER — Encounter: Payer: Self-pay | Admitting: Family Medicine

## 2023-02-13 ENCOUNTER — Ambulatory Visit (INDEPENDENT_AMBULATORY_CARE_PROVIDER_SITE_OTHER): Payer: Self-pay | Admitting: Family Medicine

## 2023-02-13 VITALS — BP 104/68 | HR 72 | Temp 98.5°F | Ht 64.0 in | Wt 127.4 lb

## 2023-02-13 DIAGNOSIS — G8929 Other chronic pain: Secondary | ICD-10-CM

## 2023-02-13 DIAGNOSIS — M25562 Pain in left knee: Secondary | ICD-10-CM

## 2023-02-13 DIAGNOSIS — M25561 Pain in right knee: Secondary | ICD-10-CM

## 2023-02-13 DIAGNOSIS — Z020 Encounter for examination for admission to educational institution: Secondary | ICD-10-CM

## 2023-02-13 NOTE — Progress Notes (Signed)
Established Patient Office Visit   Subjective:  Patient ID: Sylvia Best, female    DOB: 04/26/1981  Age: 42 y.o. MRN: [id number]  Chief Complaint  Patient presents with   Knee Pain    Concerns about continued knee pain in both knees. Patient needing form filled out for work.     Knee Pain  Pertinent negatives include no tingling.   Encounter Diagnoses  Name Primary?   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  Brings in a form today.  She will need QuantiFERON gold for TB and testing for MMR immunity.  She is up-to-date on her tetanus and has had the hep B series.  Ongoing issues with right greater than left knee pain.  Status post injections to the right knee in the distant past.  No injury history.  They do not lock or give way.  She has tried some over-the-counter anti-inflammatories.   Review of Systems  Constitutional: Negative.   HENT: Negative.    Eyes:  Negative for blurred vision, discharge and redness.  Respiratory: Negative.    Cardiovascular: Negative.   Gastrointestinal:  Negative for abdominal pain.  Genitourinary: Negative.   Musculoskeletal:  Positive for joint pain. Negative for myalgias.  Skin:  Negative for rash.  Neurological:  Negative for tingling, loss of consciousness and weakness.  Endo/Heme/Allergies:  Negative for polydipsia.     Current Outpatient Medications:    cetirizine (ZYRTEC) 10 MG tablet, Take 10 mg by mouth daily., Disp: , Rfl:    etonogestrel (NEXPLANON) 68 MG IMPL implant, 1 each by Subdermal route once., Disp: , Rfl:    fluticasone (FLONASE) 50 MCG/ACT nasal spray, Place into both nostrils daily., Disp: , Rfl:    metFORMIN (GLUCOPHAGE) 500 MG tablet, TAKE 1 TABLET(500 MG) BY MOUTH TWICE DAILY WITH A MEAL, Disp: 180 tablet, Rfl: 1   terconazole (TERAZOL 3) 0.8 % vaginal cream, Place 1 applicator vaginally at bedtime. Until  finished, Disp: 20 g, Rfl: 0   naproxen (NAPROSYN) 500 MG tablet, Take 1 tablet (500 mg total) by mouth 2 (two) times daily with a meal. (Patient not taking: Reported on 02/13/2023), Disp: 14 tablet, Rfl: 0   predniSONE (STERAPRED UNI-PAK 21 TAB) 10 MG (21) TBPK tablet, Take following package directions (Patient not taking: Reported on 02/13/2023), Disp: 21 tablet, Rfl: 0   Objective:     BP 104/68 (BP Location: Right Arm, Patient Position: Sitting, Cuff Size: Normal)   Pulse 72   Temp 98.5 F (36.9 C) (Temporal)   Ht 5\' 4"  (1.626 m)   Wt 127 lb 6.4 oz (57.8 kg)   SpO2 98%   Breastfeeding No   BMI 21.87 kg/m    Physical Exam Constitutional:      General: She is not in acute distress.    Appearance: Normal appearance. She is not ill-appearing, toxic-appearing or diaphoretic.  HENT:     Head: Normocephalic and atraumatic.     Right Ear: External ear normal.     Left Ear: External ear normal.     Mouth/Throat:     Mouth: Mucous membranes are moist.     Pharynx: Oropharynx is clear. No oropharyngeal exudate or posterior oropharyngeal erythema.  Eyes:     General: No scleral icterus.       Right eye: No discharge.        Left eye:  No discharge.     Extraocular Movements: Extraocular movements intact.     Conjunctiva/sclera: Conjunctivae normal.     Pupils: Pupils are equal, round, and reactive to light.  Cardiovascular:     Rate and Rhythm: Normal rate and regular rhythm.  Pulmonary:     Effort: Pulmonary effort is normal. No respiratory distress.     Breath sounds: Normal breath sounds.  Abdominal:     General: Bowel sounds are normal.  Musculoskeletal:     Cervical back: No rigidity or tenderness.     Right knee: No swelling, effusion or bony tenderness. Normal range of motion. No tenderness.     Instability Tests: Anterior drawer test negative. Posterior drawer test negative.     Left knee: No swelling, effusion or bony tenderness. Normal range of motion. No tenderness.      Instability Tests: Anterior drawer test negative. Posterior drawer test negative.  Skin:    General: Skin is warm and dry.  Neurological:     Mental Status: She is alert and oriented to person, place, and time.  Psychiatric:        Mood and Affect: Mood normal.        Behavior: Behavior normal.      No results found for any visits on 02/13/23.    The ASCVD Risk score (Arnett DK, et al., 2019) failed to calculate for the following reasons:   Unable to determine if patient is Non-Hispanic African American    Assessment & Plan:   Chronic pain of right knee -     Ambulatory referral to Sports Medicine  Left knee pain, unspecified chronicity -     Ambulatory referral to Sports Medicine  School physical exam -     QuantiFERON-TB Gold Plus -     Measles/Mumps/Rubella Immunity    No follow-ups on file.    Mliss Sax, MD

## 2023-02-15 LAB — QUANTIFERON-TB GOLD PLUS
Mitogen-NIL: 8.13 IU/mL
NIL: 0.05 IU/mL
QuantiFERON-TB Gold Plus: NEGATIVE
TB1-NIL: 0 IU/mL
TB2-NIL: 0 IU/mL

## 2023-02-15 LAB — MEASLES/MUMPS/RUBELLA IMMUNITY
Mumps IgG: 74.7 AU/mL
Rubella: 2.25 Index
Rubeola IgG: >300 AU/mL

## 2023-02-25 ENCOUNTER — Ambulatory Visit: Payer: Medicaid Other | Admitting: Family Medicine

## 2023-03-02 ENCOUNTER — Encounter (HOSPITAL_COMMUNITY): Payer: Self-pay

## 2023-03-02 ENCOUNTER — Other Ambulatory Visit: Payer: Self-pay

## 2023-03-02 ENCOUNTER — Emergency Department (HOSPITAL_COMMUNITY)
Admission: EM | Admit: 2023-03-02 | Discharge: 2023-03-03 | Disposition: A | Discharge status: Home / Self Care | Payer: 59 | Attending: Emergency Medicine | Admitting: Emergency Medicine

## 2023-03-02 DIAGNOSIS — W25XXXA Contact with sharp glass, initial encounter: Secondary | ICD-10-CM | POA: Insufficient documentation

## 2023-03-02 DIAGNOSIS — S61512A Laceration without foreign body of left wrist, initial encounter: Secondary | ICD-10-CM | POA: Diagnosis present

## 2023-03-02 DIAGNOSIS — Z23 Encounter for immunization: Secondary | ICD-10-CM | POA: Insufficient documentation

## 2023-03-02 DIAGNOSIS — Y93E5 Activity, floor mopping and cleaning: Secondary | ICD-10-CM | POA: Diagnosis not present

## 2023-03-02 MED ORDER — ACETAMINOPHEN 325 MG PO TABS
650.0000 mg | ORAL_TABLET | Freq: Once | ORAL | Status: AC
  Administered 2023-03-02: 650 mg via ORAL
  Filled 2023-03-02: qty 2

## 2023-03-02 NOTE — ED Triage Notes (Signed)
Pt. Arrives POV for a laceration to the Left wrist. Pt. Was cleaning a glass window when a piece of glass broke and cut her wrist. Bleeding is controlled at this time. Per the pt. There was no spurting of blood, just oozing. Pt. Poured coffee over the laceration to keep the bleeding controlled.

## 2023-03-02 NOTE — ED Notes (Signed)
Pt. Requesting something for pain. Provider notified

## 2023-03-03 ENCOUNTER — Emergency Department (HOSPITAL_COMMUNITY): Payer: 59

## 2023-03-03 MED ORDER — NAPROXEN 500 MG PO TABS: 500.0000 mg | ORAL_TABLET | Freq: Two times a day (BID) | ORAL | 0 refills | Status: AC

## 2023-03-03 MED ORDER — TETANUS-DIPHTH-ACELL PERTUSSIS 5-2.5-18.5 LF-MCG/0.5 IM SUSY
0.5000 mL | PREFILLED_SYRINGE | Freq: Once | INTRAMUSCULAR | Status: AC
  Administered 2023-03-03: 0.5 mL via INTRAMUSCULAR
  Filled 2023-03-03: qty 0.5

## 2023-03-03 MED ORDER — LIDOCAINE HCL (PF) 1 % IJ SOLN
30.0000 mL | Freq: Once | INTRAMUSCULAR | Status: AC
  Administered 2023-03-03: 30 mL
  Filled 2023-03-03: qty 30

## 2023-03-03 MED ORDER — CEPHALEXIN 500 MG PO CAPS: 500.0000 mg | ORAL_CAPSULE | Freq: Three times a day (TID) | ORAL | 0 refills | Status: AC

## 2023-03-03 MED ORDER — CEPHALEXIN 250 MG PO CAPS: 250.0000 mg | ORAL_CAPSULE | Freq: Once | ORAL | Status: DC

## 2023-03-03 MED ORDER — IBUPROFEN 200 MG PO TABS
600.0000 mg | ORAL_TABLET | Freq: Once | ORAL | Status: AC
  Administered 2023-03-03: 600 mg via ORAL
  Filled 2023-03-03: qty 3

## 2023-03-03 MED ORDER — BACITRACIN ZINC 500 UNIT/GM EX OINT
TOPICAL_OINTMENT | CUTANEOUS | Status: AC
  Administered 2023-03-03: 2
  Filled 2023-03-03: qty 1.8

## 2023-03-03 MED ORDER — CEPHALEXIN 500 MG PO CAPS
500.0000 mg | ORAL_CAPSULE | Freq: Once | ORAL | Status: AC
  Administered 2023-03-03: 500 mg via ORAL
  Filled 2023-03-03: qty 1

## 2023-03-03 NOTE — ED Notes (Signed)
Left wrist laceration irrigated to remove dried coffee grounds that were placed prior to arrival by pt "for controlled bleeding". Bleeding is controlled and laceration dressed with nonadherent gauze and coban

## 2023-03-03 NOTE — Discharge Instructions (Signed)
You are seen today for a cut on your wrist.  Your x-ray did not show any fracture or any pieces of glass left in the cut.  We cleaned your wound and put in sutures which will need to be cut out in approximately a week.  Keep the area clean and dry.  We gave you some antibiotics to help prevent infection. You can use antibiotic ointment on this daily.  Keep it covered especially and at work, limit the use of your left hand for the next couple of days until the swelling and pain are improved.  Come back to the ER if you have any new or worsening symptoms, especially fever, redness, swelling, or drainage.

## 2023-03-03 NOTE — ED Provider Notes (Addendum)
Sylvia Best EMERGENCY DEPARTMENT AT [facility] Provider Note   CSN: [id number] Arrival date & time: 03/02/23  2314     History  Chief Complaint  Patient presents with   Laceration    Sylvia Best is a 42 y.o. female.  She is left-hand dominant presents the ER today after laceration to left hand.  States she was cleaning windows at home when the window broke and her hand went through the window, cutting her left wrist.  She put coffee on the wound to stop the bleeding.  She is unsure of the due to her last tetanus shot.  She denies any numbness or tingling,   Laceration      Home Medications Prior to Admission medications   Medication Sig Start Date End Date Taking? Authorizing Provider  cephALEXin (KEFLEX) 500 MG capsule Take 1 capsule (500 mg total) by mouth 3 (three) times daily for 3 days. 03/03/23 03/06/23 Yes Yari Szeliga A, PA-C  naproxen (NAPROSYN) 500 MG tablet Take 1 tablet (500 mg total) by mouth 2 (two) times daily. 03/03/23  Yes Roben Tatsch A, PA-C  cetirizine (ZYRTEC) 10 MG tablet Take 10 mg by mouth daily. 05/09/22   [provider]  etonogestrel (NEXPLANON) 68 MG IMPL implant 1 each by Subdermal route once.    [provider]  fluticasone (FLONASE) 50 MCG/ACT nasal spray Place into both nostrils daily.    [provider]  metFORMIN (GLUCOPHAGE) 500 MG tablet TAKE 1 TABLET(500 MG) BY MOUTH TWICE DAILY WITH A MEAL 01/17/23   Mliss Sax, MD  predniSONE (STERAPRED UNI-PAK 21 TAB) 10 MG (21) TBPK tablet Take following package directions Patient not taking: Reported on 02/13/2023 11/29/22   Waldon Merl, PA-C  terconazole (TERAZOL 3) 0.8 % vaginal cream Place 1 applicator vaginally at bedtime. Until finished 12/05/22   Mliss Sax, MD      Allergies    Patient has no known allergies.    Review of Systems   Review of Systems  Physical Exam Updated Vital Signs BP (!) 128/90 (BP Location: Right Arm)    Pulse 89   Temp (!) 97.5 F (36.4 C) (Oral)   Resp 20   SpO2 99%  Physical Exam Vitals and nursing note reviewed.  Constitutional:      General: She is not in acute distress.    Appearance: She is well-developed.  HENT:     Head: Normocephalic and atraumatic.  Eyes:     Conjunctiva/sclera: Conjunctivae normal.  Cardiovascular:     Rate and Rhythm: Normal rate and regular rhythm.     Heart sounds: No murmur heard. Pulmonary:     Effort: Pulmonary effort is normal. No respiratory distress.     Breath sounds: Normal breath sounds.  Abdominal:     Palpations: Abdomen is soft.     Tenderness: There is no abdominal tenderness.  Musculoskeletal:     Cervical back: Neck supple.     Comments: Left wrist has mild swelling on the volar aspect in the area of her laceration.  Radial pulse in left wrist is 2+, sensation intact throughout entire left upper extremity.  Patient can flex and extend wrist but has pain on flexion, no weakness.  She is able to pronate and supinate.  She can make okay sign, thumbs up and cross her fingers  Skin:    General: Skin is warm and dry.     Capillary Refill: Capillary refill takes less than 2 seconds.  Comments: 4 cm laceration linear to volar aspect of left wrist, no foreign body noted  Neurological:     General: No focal deficit present.     Mental Status: She is alert and oriented to person, place, and time.  Psychiatric:        Mood and Affect: Mood normal.     ED Results / Procedures / Treatments   Labs (all labs ordered are listed, but only abnormal results are displayed) Labs Reviewed - No data to display  EKG None  Radiology DG Wrist Complete Left  Result Date: 03/03/2023 CLINICAL DATA:  Left wrist laceration, initial encounter EXAM: LEFT WRIST - COMPLETE 3+ VIEW COMPARISON:  None Available. FINDINGS: Soft tissue irregularity is noted along the anterior aspect wrist consistent with the recent injury. No radiopaque foreign body is noted.  No acute fracture is noted. IMPRESSION: Soft tissue injury is noted consistent with the given clinical history. No foreign body is seen. No bony abnormality is noted. Electronically Signed   By: Alcide Clever M.D.   On: 03/03/2023 00:25    Procedures .Marland KitchenLaceration Repair  Date/Time: 03/03/2023 2:39 AM  Performed by: Ma Rings, PA-C Authorized by: Ma Rings, PA-C   Consent:    Consent obtained:  Verbal   Consent given by:  Patient   Risks, benefits, and alternatives were discussed: yes     Risks discussed:  Infection, pain and retained foreign body Universal protocol:    Patient identity confirmed:  Verbally with patient Anesthesia:    Anesthesia method:  Local infiltration   Local anesthetic:  Lidocaine 1% w/o epi Laceration details:    Location:  Shoulder/arm   Shoulder/arm location:  L lower arm   Length (cm):  4 Pre-procedure details:    Preparation:  Imaging obtained to evaluate for foreign bodies Exploration:    Limited defect created (wound extended): no     Hemostasis achieved with:  Direct pressure   Imaging obtained: x-ray     Imaging outcome: foreign body not noted     Wound exploration: wound explored through full range of motion and entire depth of wound visualized     Wound extent: fascia not violated, no foreign body, no signs of injury, no nerve damage, no tendon damage and no vascular damage     Contaminated: yes   Treatment:    Area cleansed with:  Povidone-iodine   Amount of cleaning:  Extensive   Irrigation solution:  Sterile saline   Irrigation method:  Syringe Skin repair:    Repair method:  Sutures   Suture size:  5-0   Suture material:  Prolene   Suture technique:  Simple interrupted   Number of sutures:  5 Approximation:    Approximation:  Close Repair type:    Repair type:  Simple Post-procedure details:    Dressing:  Antibiotic ointment and non-adherent dressing   Procedure completion:  Tolerated well, no immediate  complications     Medications Ordered in ED Medications  cephALEXin (KEFLEX) capsule 500 mg (has no administration in time range)  bacitracin 500 UNIT/GM ointment (has no administration in time range)  acetaminophen (TYLENOL) tablet 650 mg (650 mg Oral Given 03/02/23 2358)  Tdap (BOOSTRIX) injection 0.5 mL (0.5 mLs Intramuscular Given 03/03/23 0103)  lidocaine (PF) (XYLOCAINE) 1 % injection 30 mL (30 mLs Infiltration Given 03/03/23 0214)  ibuprofen (ADVIL) tablet 600 mg (600 mg Oral Given 03/03/23 1610)    ED Course/ Medical Decision Making/ A&P  Medical Decision Making This patient presents to the ED for concern of ration to the left wrist sustained while cleaning a window tonight, this involves an extensive number of treatment options, and is a complaint that carries with it a high risk of complications and morbidity.  The differential diagnosis includes aspiration, abrasion, retained foreign body, cellulitis, other   Co morbidities that complicate the patient evaluation  Prediabetes   Additional history obtained:  Additional history obtained from EMR External records from outside source obtained and reviewed including prior notes   Imaging Studies ordered:  I ordered imaging studies including a left wrist I independently visualized and interpreted imaging which showed fracture, no retained foreign body I agree with the radiologist interpretation     Problem List / ED Course / Critical interventions / Medication management  For evaluation of laceration to left wrist, sustained when cleaning a window tonight and the window broke.  She has a mild soft tissue swelling, no concerns for self-inflicted injury, no signs of neurovascular injury on my exam.  No foreign body on visualization or on x-ray.  Patient presented with coffee in the wound that she applied to stop the bleeding at home.  This was mostly removed by nurse at bedside but small coffee  particles were irrigated out after I anesthetized the area.  There is no signs of residual coughing but given this well give some prophylactic antibiotics to prevent infection.  Advised on NSAIDs for pain, rest, elevation, advised on wound care and need for follow-up and suture removal in about a week.  She reviewed plan of care and discharge.  She requested a work note since she is left-handed and uses her hands a lot for work and this is provided for her. I ordered medication including lidocane for local anesthesia Reevaluation of the patient after these medicines showed that the patient improved I have reviewed the patients home medicines and have made adjustments as needed       Amount and/or Complexity of Data Reviewed Radiology: ordered.  Risk OTC drugs. Prescription drug management.           Final Clinical Impression(s) / ED Diagnoses Final diagnoses:  Wrist laceration, left, initial encounter    Rx / DC Orders ED Discharge Orders          Ordered    cephALEXin (KEFLEX) 500 MG capsule  3 times daily        03/03/23 0245    naproxen (NAPROSYN) 500 MG tablet  2 times daily        03/03/23 0258              Ma Rings, PA-C 03/03/23 0258    Carmel Sacramento A, PA-C 03/03/23 0300    Glynn Octave, MD 03/03/23 [phone]

## 2023-03-03 NOTE — ED Notes (Signed)
Laceration dressed with bacitracin and nonadhering dressing with gauze wrap

## 2023-03-05 ENCOUNTER — Telehealth: Payer: Self-pay | Admitting: Family Medicine

## 2023-03-05 ENCOUNTER — Telehealth: Payer: Self-pay

## 2023-03-05 NOTE — Telephone Encounter (Signed)
Pt said some one called her from here . But as I looked at the notes it looks like you called her. Please return her call

## 2023-03-05 NOTE — Transitions of Care (Post Inpatient/ED Visit) (Signed)
   03/05/2023  Name: Sylvia Best MRN: [id number] DOB: 05/30/1981  Today's TOC FU Call Status: Today's TOC FU Call Status:: Unsuccessul Call (1st Attempt) Unsuccessful Call (1st Attempt) Date: 03/05/23  Attempted to reach the patient regarding the most recent Inpatient/ED visit.  Follow Up Plan: Additional outreach attempts will be made to reach the patient to complete the Transitions of Care (Post Inpatient/ED visit) call.   Signature Arvil Persons, BSN, Charity fundraiser  .

## 2023-03-05 NOTE — Transitions of Care (Post Inpatient/ED Visit) (Signed)
   03/05/2023  Name: Sylvia Best MRN: [id number] DOB: 1980-10-12  Today's TOC FU Call Status: Today's TOC FU Call Status:: Successful TOC FU Call Competed Unsuccessful Call (1st Attempt) Date: 03/05/23 [facility] FU Call Complete Date: 03/05/23  Transition Care Management Follow-up Telephone Call Date of Discharge: 03/03/23 Discharge Facility: [facility]) Type of Discharge: Emergency Department Reason for ED Visit: Other: (wrist laceration) How have you been since you were released from the hospital?: Better Any questions or concerns?: No  Items Reviewed: Did you receive and understand the discharge instructions provided?: Yes Medications obtained,verified, and reconciled?: Yes (Medications Reviewed) Any new allergies since your discharge?: No Dietary orders reviewed?: NA Do you have support at home?: Yes  Medications Reviewed Today: Medications Reviewed Today     Reviewed by Larey Dresser, RN (Registered Nurse) on 03/05/23 at 1358  Med List Status: <None>   Medication Order Taking? Sig Documenting Provider Last Dose Status Informant  cephALEXin (KEFLEX) 500 MG capsule [id number]  Take 1 capsule (500 mg total) by mouth 3 (three) times daily for 3 days. Cristi Loron, Celeste A, PA-C  Active   cetirizine (ZYRTEC) 10 MG tablet [id number] No Take 10 mg by mouth daily. [provider] Taking Active   etonogestrel (NEXPLANON) 68 MG IMPL implant [id number] No 1 each by Subdermal route once. [provider] Taking Active   fluticasone (FLONASE) 50 MCG/ACT nasal spray [id number] No Place into both nostrils daily. [provider] Taking Active   metFORMIN (GLUCOPHAGE) 500 MG tablet [id number] No TAKE 1 TABLET(500 MG) BY MOUTH TWICE DAILY WITH A MEAL Mliss Sax, MD Taking Active   naproxen (NAPROSYN) 500 MG tablet [id number]  Take 1 tablet (500 mg total) by mouth 2 (two) times daily. Beatty, Celeste A, PA-C  Active   predniSONE (STERAPRED UNI-PAK 21 TAB) 10 MG  (21) TBPK tablet [id number] No Take following package directions  Patient not taking: Reported on 02/13/2023   Waldon Merl, PA-C Not Taking Active   terconazole (TERAZOL 3) 0.8 % vaginal cream [id number] No Place 1 applicator vaginally at bedtime. Until finished Mliss Sax, MD Taking Active             Home Care and Equipment/Supplies: Were Home Health Services Ordered?: NA Any new equipment or medical supplies ordered?: NA  Functional Questionnaire: Do you need assistance with bathing/showering or dressing?: No Do you need assistance with meal preparation?: No Do you need assistance with eating?: No Do you have difficulty maintaining continence: No Do you need assistance with getting out of bed/getting out of a chair/moving?: No Do you have difficulty managing or taking your medications?: No  Follow up appointments reviewed: PCP Follow-up appointment confirmed?: Yes Date of PCP follow-up appointment?: 03/06/23 Follow-up Provider: Salvatore Decent Specialist [facility] Follow-up appointment confirmed?: NA Do you need transportation to your follow-up appointment?: No Do you understand care options if your condition(s) worsen?: Yes-patient verbalized understanding    SIGNATURE Arvil Persons, BSN, RN

## 2023-03-06 ENCOUNTER — Telehealth: Payer: Self-pay | Admitting: Family Medicine

## 2023-03-06 ENCOUNTER — Inpatient Hospital Stay: Payer: 59 | Admitting: Internal Medicine

## 2023-03-06 NOTE — Telephone Encounter (Signed)
Patient aware work note declined due to patient not cutting muscle in hand she should still be able to work. Per patient she will call back to see if she can come in sooner to have hand looked at because she feels she is not able to work until hand heals and swelling goes down.

## 2023-03-06 NOTE — Telephone Encounter (Signed)
Pt had a hosp f/up scheduled for today 03/06/23 scheduled with Morrie Sheldon, for taking stitches out on her wrist. This is to soon, she got this appt rescheduled for 03/14/23. She is needing a note for work stating she is unable to work because of this. She works at [employer].

## 2023-03-06 NOTE — Telephone Encounter (Signed)
Please advise message below patient have stitches place on 03/02/23 had an appointment to have removed today but it is to soon for them to be removed patient scheduled to come back 03/14/23 would like a note to be excused from work until she have stitches removed. Please advise is it okay for note?

## 2023-03-13 ENCOUNTER — Ambulatory Visit: Payer: 59 | Admitting: Family Medicine

## 2023-03-14 ENCOUNTER — Ambulatory Visit (INDEPENDENT_AMBULATORY_CARE_PROVIDER_SITE_OTHER): Payer: 59 | Admitting: Family Medicine

## 2023-03-14 ENCOUNTER — Encounter: Payer: Self-pay | Admitting: Family Medicine

## 2023-03-14 VITALS — BP 118/70 | HR 79 | Temp 98.0°F | Ht 65.0 in | Wt 129.6 lb

## 2023-03-14 DIAGNOSIS — Z4802 Encounter for removal of sutures: Secondary | ICD-10-CM | POA: Diagnosis not present

## 2023-03-14 NOTE — Progress Notes (Signed)
Established Patient Office Visit   Subjective:  Patient ID: Sylvia Best, female    DOB: 13-Feb-1981  Age: 42 y.o. MRN: [id number]  Chief Complaint  Patient presents with   Follow-up    Follow Up. Suture removal. Pt state she still has some edema and soreness.     HPI Encounter Diagnoses  Name Primary?   Visit for suture removal Yes   For suture removal.  Laceration repair was back on the 16th.  Wound is done well.  She has kept it covered at work and allow it to air dry at home.   Review of Systems  Constitutional: Negative.   HENT: Negative.    Eyes:  Negative for blurred vision, discharge and redness.  Respiratory: Negative.    Cardiovascular: Negative.   Gastrointestinal:  Negative for abdominal pain.  Genitourinary: Negative.   Musculoskeletal: Negative.  Negative for myalgias.  Skin:  Negative for rash.  Neurological:  Negative for tingling, loss of consciousness and weakness.  Endo/Heme/Allergies:  Negative for polydipsia.     Current Outpatient Medications:    cetirizine (ZYRTEC) 10 MG tablet, Take 10 mg by mouth daily., Disp: , Rfl:    etonogestrel (NEXPLANON) 68 MG IMPL implant, 1 each by Subdermal route once., Disp: , Rfl:    fluticasone (FLONASE) 50 MCG/ACT nasal spray, Place into both nostrils daily., Disp: , Rfl:    metFORMIN (GLUCOPHAGE) 500 MG tablet, TAKE 1 TABLET(500 MG) BY MOUTH TWICE DAILY WITH A MEAL, Disp: 180 tablet, Rfl: 1   naproxen (NAPROSYN) 500 MG tablet, Take 1 tablet (500 mg total) by mouth 2 (two) times daily., Disp: 10 tablet, Rfl: 0   terconazole (TERAZOL 3) 0.8 % vaginal cream, Place 1 applicator vaginally at bedtime. Until finished, Disp: 20 g, Rfl: 0   predniSONE (STERAPRED UNI-PAK 21 TAB) 10 MG (21) TBPK tablet, Take following package directions (Patient not taking: Reported on 02/13/2023), Disp: 21 tablet, Rfl: 0   Objective:     BP 118/70   Pulse 79   Temp 98 F (36.7 C)   Ht 5\' 5"  (1.651 m)   Wt 129 lb 9.6 oz (58.8 kg)   SpO2  99%   BMI 21.57 kg/m    Physical Exam Constitutional:      General: She is not in acute distress.    Appearance: Normal appearance. She is not ill-appearing, toxic-appearing or diaphoretic.  HENT:     Head: Normocephalic and atraumatic.     Right Ear: External ear normal.     Left Ear: External ear normal.  Eyes:     General: No scleral icterus.       Right eye: No discharge.        Left eye: No discharge.     Extraocular Movements: Extraocular movements intact.     Conjunctiva/sclera: Conjunctivae normal.  Pulmonary:     Effort: Pulmonary effort is normal. No respiratory distress.  Skin:    General: Skin is warm and dry.       Neurological:     Mental Status: She is alert and oriented to person, place, and time.  Psychiatric:        Mood and Affect: Mood normal.        Behavior: Behavior normal.      No results found for any visits on 03/14/23.    The ASCVD Risk score (Arnett DK, et al., 2019) failed to calculate for the following reasons:   Unable to determine if patient is Non-Hispanic African American  Assessment & Plan:   Visit for suture removal    Return if symptoms worsen or fail to improve.  Patient recovered work and allowed air dry at home.  Information was given on suture removal aftercare.  Please RTC with any issues or concerns.  Mliss Sax, MD

## 2023-04-01 ENCOUNTER — Ambulatory Visit: Payer: 59 | Admitting: Sports Medicine

## 2023-04-22 ENCOUNTER — Ambulatory Visit: Payer: 59 | Admitting: Family Medicine

## 2023-04-22 ENCOUNTER — Encounter: Payer: Self-pay | Admitting: Family Medicine

## 2023-04-22 VITALS — BP 104/82 | Ht 65.0 in | Wt 127.0 lb

## 2023-04-22 DIAGNOSIS — M222X1 Patellofemoral disorders, right knee: Secondary | ICD-10-CM | POA: Diagnosis not present

## 2023-04-22 DIAGNOSIS — M222X2 Patellofemoral disorders, left knee: Secondary | ICD-10-CM | POA: Diagnosis not present

## 2023-04-22 MED ORDER — MELOXICAM 15 MG PO TABS
15.0000 mg | ORAL_TABLET | Freq: Every day | ORAL | 0 refills | Status: AC
Start: 2023-04-22 — End: ?

## 2023-04-22 NOTE — Progress Notes (Signed)
PCP: Mliss Sax, MD  Subjective:   HPI: Patient is a 42 y.o. female here for Bilateral knee pain.  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  Patient states that the pain happens whenever she bends her knees, goes up and down stairs or whenever she is praying.  Patient states that the pain is located right on her kneecaps bilaterally.  Patient denies any trauma to the area.  Patient has no other concerns at this time.  Patient does not exercise and has never tried physical therapy.  Patient states that she has had a steroid injection in her right knee in the past but is unsure when.  Past Medical History:  Diagnosis Date   Asthma    Diabetes mellitus    gestational   GERD (gastroesophageal reflux disease)    no meds   Gestational diabetes     Current Outpatient Medications on File Prior to Visit  Medication Sig Dispense Refill   cetirizine (ZYRTEC) 10 MG tablet Take 10 mg by mouth daily.     etonogestrel (NEXPLANON) 68 MG IMPL implant 1 each by Subdermal route once.     fluticasone (FLONASE) 50 MCG/ACT nasal spray Place into both nostrils daily.     metFORMIN (GLUCOPHAGE) 500 MG tablet TAKE 1 TABLET(500 MG) BY MOUTH TWICE DAILY WITH A MEAL 180 tablet 1   naproxen (NAPROSYN) 500 MG tablet Take 1 tablet (500 mg total) by mouth 2 (two) times daily. 10 tablet 0   predniSONE (STERAPRED UNI-PAK 21 TAB) 10 MG (21) TBPK tablet Take following package directions (Patient not taking: Reported on 02/13/2023) 21 tablet 0   terconazole (TERAZOL 3) 0.8 % vaginal cream Place 1 applicator vaginally at bedtime. Until finished 20 g 0   No current facility-administered medications on file prior to visit.    Past Surgical History:  Procedure Laterality Date   CESAREAN SECTION  04/26/2011   Procedure: CESAREAN SECTION;  Surgeon: Reva Bores,  MD;  Location: [facility];  Service: Gynecology;  Laterality: N/A;   CESAREAN SECTION N/A 04/05/2016   Procedure: CESAREAN SECTION;  Surgeon: Tereso Newcomer, MD;  Location: [facility];  Service: Obstetrics;  Laterality: N/A;   DILATION AND CURETTAGE OF UTERUS     IR ANGIO INTRA EXTRACRAN SEL COM CAROTID INNOMINATE BILAT MOD SED  01/04/2020   IR ANGIO VERTEBRAL SEL VERTEBRAL BILAT MOD SED  01/04/2020   IR US GUIDE VASC ACCESS RIGHT  01/04/2020   TONSILLECTOMY     age 50    No Known Allergies  BP 104/82   Ht 5\' 5"  (1.651 m)   Wt 127 lb (57.6 kg)   BMI 21.13 kg/m       No data to display              No data to display              Objective:  Physical Exam:  Gen: NAD, comfortable in exam room  Knee, Bilateral: Inspection was negative for erythema, ecchymosis, and effusion. No obvious bony abnormalities or signs of osteophyte development. Palpation yielded no asymmetric warmth; No joint line tenderness; No condyle tenderness; Bilateral posterior patellar facet tenderness noted ; Mild knee crepitus. Patellar and quadriceps tendons unremarkable, and no tenderness of the pes anserine bursa. No obvious Baker's cyst development. ROM  normal in flexion (135 degrees) and extension (0 degrees). Strength 5/5 with knee flexion and extension though pain is noted on knee extension. Neurovascularly intact bilaterally.   Provocative Testing:    - Patella:   - Patellar grind/compression: POSITIVE    Assessment & Plan:  1. 1. Patellofemoral pain syndrome of both knees - Patient's symptoms and physical exam are likely related to patellofemoral syndrome of the bilateral knees.  Would recommend patient do quadricep strengthening exercises focusing on VMO and will make referral to physical therapy at this time.  Would also recommend taking anti-inflammatory to decrease swelling and inflammation.  Patient take meloxicam daily for the next 2 weeks and then as needed afterwards.  Patient was  advised to stop taking ibuprofen.  Patient to follow-up if there is no improvement in her pain. - Ambulatory referral to Physical Therapy - meloxicam (MOBIC) 15 MG tablet; Take 1 tablet (15 mg total) by mouth daily.  Dispense: 30 tablet; Refill: 0    Brenton Grills MD, PGY-4  Sports Medicine Fellow [facility]

## 2023-04-22 NOTE — Patient Instructions (Signed)
You have patellofemoral syndrome. Avoid painful activities when possible (often deep squats, lunges, leg press bother this). Start physical therapy - do home exercises on days you don't go to therapy. Icing 15 minutes at a time 3-4 times a day as needed. Meloxicam 15mg  daily with food for pain and inflammation. Follow up with me in 6 weeks.

## 2023-05-12 ENCOUNTER — Ambulatory Visit: Payer: Self-pay | Attending: Family Medicine | Admitting: Physical Therapy

## 2023-05-12 ENCOUNTER — Encounter: Payer: Self-pay | Admitting: Physical Therapy

## 2023-05-12 DIAGNOSIS — M222X1 Patellofemoral disorders, right knee: Secondary | ICD-10-CM | POA: Insufficient documentation

## 2023-05-12 DIAGNOSIS — M25562 Pain in left knee: Secondary | ICD-10-CM | POA: Insufficient documentation

## 2023-05-12 DIAGNOSIS — M222X2 Patellofemoral disorders, left knee: Secondary | ICD-10-CM | POA: Insufficient documentation

## 2023-05-12 DIAGNOSIS — M25561 Pain in right knee: Secondary | ICD-10-CM | POA: Insufficient documentation

## 2023-05-12 DIAGNOSIS — M6281 Muscle weakness (generalized): Secondary | ICD-10-CM | POA: Insufficient documentation

## 2023-05-12 DIAGNOSIS — G8929 Other chronic pain: Secondary | ICD-10-CM | POA: Insufficient documentation

## 2023-05-12 NOTE — Therapy (Addendum)
OUTPATIENT PHYSICAL THERAPY LOWER EXTREMITY EVALUATION and DISCHARGE   Patient Name: Sylvia Best MRN: [id number] DOB:[birth year], 42 y.o., female Today's Date: 05/12/2023    END OF SESSION:  PT End of Session - 05/12/23 1032     Visit Number 1    Number of Visits 12    Date for PT Re-Evaluation 06/23/23    Authorization Type Jaye Beagle    PT Start Time 1030   15 min late   PT Stop Time 1100    PT Time Calculation (min) 30 min    Activity Tolerance Patient limited by pain    Behavior During Therapy [facility] for tasks assessed/performed             Past Medical History:  Diagnosis Date   Asthma    Diabetes mellitus    gestational   GERD (gastroesophageal reflux disease)    no meds   Gestational diabetes    Past Surgical History:  Procedure Laterality Date   CESAREAN SECTION  04/26/2011   Procedure: CESAREAN SECTION;  Surgeon: Reva Bores, MD;  Location: [facility];  Service: Gynecology;  Laterality: N/A;   CESAREAN SECTION N/A 04/05/2016   Procedure: CESAREAN SECTION;  Surgeon: Tereso Newcomer, MD;  Location: [facility];  Service: Obstetrics;  Laterality: N/A;   DILATION AND CURETTAGE OF UTERUS     IR ANGIO INTRA EXTRACRAN SEL COM CAROTID INNOMINATE BILAT MOD SED  01/04/2020   IR ANGIO VERTEBRAL SEL VERTEBRAL BILAT MOD SED  01/04/2020   IR US GUIDE VASC ACCESS RIGHT  01/04/2020   TONSILLECTOMY     age 30   Patient Active Problem List   Diagnosis Date Noted   Prediabetes 07/16/2022   Acne 07/16/2022   Vaginal pruritus 07/16/2022   Right wrist pain 06/18/2022   Blurred vision, bilateral 06/12/2022   Hemiplegic migraine without status migrainosus, not intractable 06/12/2022   Subarachnoid bleed (HCC) 01/04/2020   Headache 01/02/2020   Sudden onset of severe headache 01/01/2020   School physical exam 07/15/2019   Breast lump on left side at 1 o'clock position 07/15/2019   Left knee pain 12/30/2017   Abnormal x-ray of lower extremity 12/30/2017   Allergic  rhinitis 10/13/2017   Pelvic pain 11/06/2016   Dyspareunia in female 11/06/2016   S/P cesarean section 04/05/2016   Orthostatic dizziness 12/05/2015   Umbilical pain 10/31/2015   Language barrier, cultural differences 10/31/2015   History of gestational diabetes 10/03/2015   AMA (advanced maternal age) multigravida 35+ 10/03/2015    PCP: Nadene Rubins MD   REFERRING PROVIDER: Norton Blizzard MD   REFERRING DIAG: Bilateral knee pain   THERAPY DIAG:  Chronic pain of both knees  Muscle weakness (generalized)  Rationale for Evaluation and Treatment: Rehabilitation  ONSET DATE: 3 years  SUBJECTIVE:   SUBJECTIVE STATEMENT: Patient has had bilateral knee pain for about 3 yrs.  She reports Rt knee pain > L.  She has pain mostly in the AM hours. The pain does subsides with some movement.  She has difficulty lifting objects ([employer]), walking, going up and down the stairs, sleeping.  She has trouble driving (sitting in one position).    PERTINENT HISTORY: unknown PAIN:  Are you having pain? Yes: NPRS scale: 8/10 Pain location: R>L  Pain description: dull Aggravating factors: prolonged positioning  Relieving factors: nothing, OTC gel, medicine gives little help  PRECAUTIONS: None  RED FLAGS: None   WEIGHT BEARING RESTRICTIONS: No  FALLS:  Has patient fallen in last 6  months?  None, sometimes she feels off balance   LIVING ENVIRONMENT: Lives with: lives with their family Lives in: House/apartment Stairs: Yes: Internal: 5  steps; on right going up Has following equipment at home: None  OCCUPATION: works part time at [employer]  PLOF: Independent  PATIENT GOALS: Pt would like to have less pain   NEXT MD VISIT: Dr Pearletha Forge, unsure of date   OBJECTIVE:   DIAGNOSTIC FINDINGS: NA  PATIENT SURVEYS:  No time on eva, deferred   COGNITION: Overall cognitive status: Within functional limits for tasks assessed     SENSATION: WFL  EDEMA:  None   MUSCLE  LENGTH: WNL   POSTURE: rounded shoulders, forward head, increased thoracic kyphosis, and    PALPATION: Pain grossly in patella bilateral and medial joint lines   LOWER EXTREMITY ROM:  Active ROM Right eval Left eval  Hip flexion    Hip extension    Hip abduction    Hip adduction    Hip internal rotation    Hip external rotation    Knee flexion 140 140  Knee extension 0 0  Ankle dorsiflexion    Ankle plantarflexion    Ankle inversion    Ankle eversion     (Blank rows = not tested)  LOWER EXTREMITY MMT:  MMT Right eval Left eval  Hip flexion 4- 3+  Hip extension    Hip abduction 4+ 4+  Hip adduction    Hip internal rotation    Hip external rotation    Knee flexion 4+ 4+  Knee extension 4 pain  4 pain   Ankle dorsiflexion    Ankle plantarflexion    Ankle inversion    Ankle eversion     (Blank rows = not tested)  LOWER EXTREMITY SPECIAL TESTS:  Knee special tests: Anterior drawer test: negative and McMurray's test: negative  FUNCTIONAL TESTS:  5 times sit to stand: 19 sec   GAIT: Distance walked: 150 Assistive device utilized: None Level of assistance: Complete Independence Comments: no deviation    TODAY'S TREATMENT:                                                                                                                              DATE:  05/12/23  PT eval, unable to give HEP due to time constraints     PATIENT EDUCATION:  Education details: PT, POC  Person educated: Patient Education method: Medical illustrator Education comprehension: verbalized understanding  HOME EXERCISE PROGRAM: None on eval   ASSESSMENT:  CLINICAL IMPRESSION: Patient is a 42 y.o. female who was seen today for physical therapy evaluation and treatment for bilateral knee pain, consistent with PFP syndrome.   OBJECTIVE IMPAIRMENTS: decreased activity tolerance, decreased balance, decreased mobility, difficulty walking, decreased strength, impaired  flexibility, and pain.   ACTIVITY LIMITATIONS: carrying, lifting, bending, sitting, standing, squatting, sleeping, stairs, transfers, and locomotion level  PARTICIPATION LIMITATIONS: cleaning, interpersonal relationship, driving, shopping, community activity, and occupation  PERSONAL FACTORS: Social background, Time since onset of injury/illness/exacerbation, and 1 comorbidity: diabetes   are also affecting patient's functional outcome.   REHAB POTENTIAL: Excellent  CLINICAL DECISION MAKING: Stable/uncomplicated  EVALUATION COMPLEXITY: Low   GOALS: Goals reviewed with patient? No     LONG TERM GOALS: Target date: 06/23/2023    Pt will be I with HEP for bilateral LEs Baseline: unknown Goal status: INITIAL  2.  Pt will be able to report squatting and lifting at work with 25% less pain  Baseline: pain is severe  Goal status: INITIAL  3.  Pt will be able to walk in the community with no more than min increase in pain in knees  Baseline: pain is mod to severe Goal status: INITIAL  4.  Pt will be able to go up and down stairs with no more than min increase pain in her home  Baseline: pain stiffess  Goal status: INITIAL  5.  Functional testing TBA when determined  Baseline:  Goal status: INITIAL   PLAN:  PT FREQUENCY: 2x/week  PT DURATION: 6 weeks  PLANNED INTERVENTIONS: Therapeutic exercises, Therapeutic activity, Neuromuscular re-education, Balance training, Patient/Family education, Self Care, Joint mobilization, Cryotherapy, Moist heat, Taping, Ionotophoresis 4mg /ml Dexamethasone, and Manual therapy  PLAN FOR NEXT SESSION: develop HEP, functional testing    Haniyah Maciolek, PT 05/12/2023, 12:55 PM   Karie Mainland, PT 05/12/23 1:06 PM Phone: [phone] Fax: [phone]

## 2023-05-25 ENCOUNTER — Other Ambulatory Visit: Payer: Self-pay | Admitting: Family Medicine

## 2023-05-25 DIAGNOSIS — M222X1 Patellofemoral disorders, right knee: Secondary | ICD-10-CM

## 2023-05-28 ENCOUNTER — Encounter: Payer: Self-pay | Admitting: Nurse Practitioner

## 2023-05-28 ENCOUNTER — Ambulatory Visit: Payer: Self-pay

## 2023-05-28 ENCOUNTER — Ambulatory Visit: Payer: Medicaid Other | Admitting: Family Medicine

## 2023-05-28 ENCOUNTER — Ambulatory Visit (INDEPENDENT_AMBULATORY_CARE_PROVIDER_SITE_OTHER): Payer: Self-pay | Admitting: Nurse Practitioner

## 2023-05-28 VITALS — BP 126/64 | HR 106 | Temp 98.6°F | Ht 65.0 in | Wt 127.8 lb

## 2023-05-28 DIAGNOSIS — H6690 Otitis media, unspecified, unspecified ear: Secondary | ICD-10-CM

## 2023-05-28 DIAGNOSIS — R7303 Prediabetes: Secondary | ICD-10-CM

## 2023-05-28 DIAGNOSIS — J4521 Mild intermittent asthma with (acute) exacerbation: Secondary | ICD-10-CM

## 2023-05-28 DIAGNOSIS — J069 Acute upper respiratory infection, unspecified: Secondary | ICD-10-CM

## 2023-05-28 LAB — POCT INFLUENZA A/B
Influenza A, POC: NEGATIVE
Influenza B, POC: NEGATIVE

## 2023-05-28 LAB — POC COVID19 BINAXNOW: SARS Coronavirus 2 Ag: NEGATIVE

## 2023-05-28 MED ORDER — AZITHROMYCIN 250 MG PO TABS: ORAL_TABLET | ORAL | 0 refills | Status: AC

## 2023-05-28 MED ORDER — ALBUTEROL SULFATE HFA 108 (90 BASE) MCG/ACT IN AERS: 2.0000 | INHALATION_SPRAY | Freq: Four times a day (QID) | RESPIRATORY_TRACT | 0 refills | Status: AC | PRN

## 2023-05-28 MED ORDER — CETIRIZINE HCL 10 MG PO TABS: 10.0000 mg | ORAL_TABLET | Freq: Every day | ORAL | 3 refills | Status: AC

## 2023-05-28 MED ORDER — METFORMIN HCL 500 MG PO TABS
500.0000 mg | ORAL_TABLET | Freq: Two times a day (BID) | ORAL | 0 refills | Status: AC
Start: 2023-05-28 — End: ?

## 2023-05-28 MED ORDER — PREDNISONE 20 MG PO TABS: 40.0000 mg | ORAL_TABLET | Freq: Every day | ORAL | 0 refills | Status: AC

## 2023-05-28 MED ORDER — PROMETHAZINE-DM 6.25-15 MG/5ML PO SYRP: 5.0000 mL | ORAL_SOLUTION | Freq: Four times a day (QID) | ORAL | 0 refills | Status: AC | PRN

## 2023-05-28 MED ORDER — ALBUTEROL SULFATE (2.5 MG/3ML) 0.083% IN NEBU
2.5000 mg | INHALATION_SOLUTION | Freq: Once | RESPIRATORY_TRACT | Status: AC
Start: 2023-05-28 — End: 2023-05-28
  Administered 2023-05-28: 2.5 mg via RESPIRATORY_TRACT

## 2023-05-28 MED ORDER — ALBUTEROL SULFATE (2.5 MG/3ML) 0.083% IN NEBU: 2.5000 mg | INHALATION_SOLUTION | Freq: Four times a day (QID) | RESPIRATORY_TRACT | 1 refills | Status: AC | PRN

## 2023-05-28 NOTE — Assessment & Plan Note (Signed)
Asthma exacerbated due to URI. With chest tightness and wheezing, albuterol neb given in office with improvement in symptoms. Start prednisone 40mg  daily x5. Continue albuterol nebulizer or inhaler every 4 hours as needed for shortness of breath. Refills sent to pharmacy. Follow-up if not improving.

## 2023-05-28 NOTE — Progress Notes (Signed)
Acute Office Visit  Subjective:     Patient ID: Sylvia Best, female    DOB: 15-Aug-1981, 43 y.o.   MRN: [id number]  Chief Complaint  Patient presents with   Cough    With headache and fever for 4 days, SOB, pain in chest from cough    HPI Patient is in today for headaches, fever, and shortness of breath for 4 days.  UPPER RESPIRATORY TRACT INFECTION  Fever: yes - 101 Cough: yes - productive Shortness of breath: yes Wheezing: yes Chest pain: yes, with cough Chest tightness: yes Chest congestion: yes Nasal congestion: yes Runny nose: yes Post nasal drip: no Sneezing: yes Sore throat: no Swollen glands: no Sinus pressure: yes Headache: yes Face pain: no Toothache: no Ear pain: yes bilateral Ear pressure: no bilateral Eyes red/itching:no Eye drainage/crusting: no  Vomiting: no Rash: no Fatigue: yes Sick contacts: yes - daughter has covid Strep contacts: no  Context: worse Recurrent sinusitis: no Relief with OTC cold/cough medications: no  Treatments attempted: motrin, tylenol, albuterol inhaler  ROS See pertinent positives and negatives per HPI.     Objective:    BP 126/64 (BP Location: Left Arm)   Pulse (!) 106   Temp 98.6 F (37 C) (Oral)   Ht 5\' 5"  (1.651 m)   Wt 127 lb 12.8 oz (58 kg)   SpO2 97%   BMI 21.27 kg/m    Physical Exam Vitals and nursing note reviewed.  Constitutional:      General: She is not in acute distress.    Appearance: Normal appearance.  HENT:     Head: Normocephalic.     Right Ear: Ear canal and external ear normal. Tympanic membrane is erythematous.     Left Ear: Tympanic membrane, ear canal and external ear normal.     Mouth/Throat:     Mouth: Mucous membranes are moist.     Pharynx: Oropharynx is clear. Posterior oropharyngeal erythema present. No oropharyngeal exudate.  Eyes:     Conjunctiva/sclera: Conjunctivae normal.  Cardiovascular:     Rate and Rhythm: Normal rate and regular rhythm.     Pulses: Normal  pulses.     Heart sounds: Normal heart sounds.  Pulmonary:     Effort: Pulmonary effort is normal.     Breath sounds: Wheezing present.     Comments: Tightness  Musculoskeletal:     Cervical back: Normal range of motion and neck supple. No tenderness.  Lymphadenopathy:     Cervical: No cervical adenopathy.  Skin:    General: Skin is warm.  Neurological:     General: No focal deficit present.     Mental Status: She is alert and oriented to person, place, and time.  Psychiatric:        Mood and Affect: Mood normal.        Behavior: Behavior normal.        Thought Content: Thought content normal.        Judgment: Judgment normal.     Results for orders placed or performed in visit on 05/28/23  POCT Influenza A/B  Result Value Ref Range   Influenza A, POC Negative Negative   Influenza B, POC Negative Negative  POC COVID-19 BinaxNow  Result Value Ref Range   SARS Coronavirus 2 Ag Negative Negative        Assessment & Plan:   Problem List Items Addressed This Visit       Respiratory   Mild intermittent asthma with exacerbation    Asthma  exacerbated due to URI. With chest tightness and wheezing, albuterol neb given in office with improvement in symptoms. Start prednisone 40mg  daily x5. Continue albuterol nebulizer or inhaler every 4 hours as needed for shortness of breath. Refills sent to pharmacy. Follow-up if not improving.       Relevant Medications   predniSONE (DELTASONE) 20 MG tablet   albuterol (VENTOLIN HFA) 108 (90 Base) MCG/ACT inhaler   albuterol (PROVENTIL) (2.5 MG/3ML) 0.083% nebulizer solution     Other   Prediabetes    Metformin 500mg  BID refill sent to pharmacy. Encourage her to schedule an appointment with PCP when feeling better      Relevant Medications   metFORMIN (GLUCOPHAGE) 500 MG tablet   Other Visit Diagnoses     Upper respiratory tract infection, unspecified type    -  Primary   Covid/flu negative. Still may be false negative for covid  with daughter testing postive. Encourage rest, fluids. Promethazine DM prn cough. F/U if not improving   Relevant Medications   azithromycin (ZITHROMAX) 250 MG tablet   Other Relevant Orders   POCT Influenza A/B (Completed)   POC COVID-19 BinaxNow (Completed)   Acute otitis media, unspecified otitis media type       Right TM erythematous. Will treat with zpak.   Relevant Medications   azithromycin (ZITHROMAX) 250 MG tablet       Meds ordered this encounter  Medications   predniSONE (DELTASONE) 20 MG tablet    Sig: Take 2 tablets (40 mg total) by mouth daily with breakfast.    Dispense:  10 tablet    Refill:  0   azithromycin (ZITHROMAX) 250 MG tablet    Sig: Take 2 tablets today and then 1 tablet daily    Dispense:  6 each    Refill:  0   promethazine-dextromethorphan (PROMETHAZINE-DM) 6.25-15 MG/5ML syrup    Sig: Take 5 mLs by mouth 4 (four) times daily as needed for cough.    Dispense:  118 mL    Refill:  0   albuterol (VENTOLIN HFA) 108 (90 Base) MCG/ACT inhaler    Sig: Inhale 2 puffs into the lungs every 6 (six) hours as needed for wheezing or shortness of breath.    Dispense:  8 g    Refill:  0   albuterol (PROVENTIL) (2.5 MG/3ML) 0.083% nebulizer solution 2.5 mg   cetirizine (ZYRTEC) 10 MG tablet    Sig: Take 1 tablet (10 mg total) by mouth daily.    Dispense:  90 tablet    Refill:  3   metFORMIN (GLUCOPHAGE) 500 MG tablet    Sig: Take 1 tablet (500 mg total) by mouth 2 (two) times daily with a meal.    Dispense:  180 tablet    Refill:  0   albuterol (PROVENTIL) (2.5 MG/3ML) 0.083% nebulizer solution    Sig: Take 3 mLs (2.5 mg total) by nebulization every 6 (six) hours as needed for wheezing or shortness of breath.    Dispense:  150 mL    Refill:  1    Return if symptoms worsen or fail to improve.  Gerre Scull, NP

## 2023-05-28 NOTE — Patient Instructions (Signed)
It was great to see you!  Start prednisone 2 tablets daily for 5 days with food  Start azithromycin 2 tablets today, then 1 tablet daily  Start cough syrup every 4 hours as needed for cough  Start albuterol inhaler every 4-6 hours as needed for shortness of breath and wheezing.  Let's follow-up if your symptoms worsen or don't improve.   Take care,  Rodman Pickle, NP

## 2023-05-28 NOTE — Assessment & Plan Note (Signed)
Metformin 500mg  BID refill sent to pharmacy. Encourage her to schedule an appointment with PCP when feeling better

## 2023-05-29 ENCOUNTER — Ambulatory Visit: Payer: Medicaid Other | Admitting: Family Medicine

## 2023-06-02 ENCOUNTER — Ambulatory Visit: Payer: 59 | Admitting: Family Medicine

## 2023-06-03 ENCOUNTER — Ambulatory Visit: Payer: Self-pay

## 2023-06-05 ENCOUNTER — Ambulatory Visit: Payer: Self-pay

## 2023-09-19 ENCOUNTER — Other Ambulatory Visit: Payer: Self-pay

## 2023-09-19 ENCOUNTER — Emergency Department (HOSPITAL_COMMUNITY): Payer: Self-pay

## 2023-09-19 ENCOUNTER — Encounter (HOSPITAL_COMMUNITY): Payer: Self-pay | Admitting: Emergency Medicine

## 2023-09-19 ENCOUNTER — Ambulatory Visit: Payer: Self-pay | Admitting: Family Medicine

## 2023-09-19 ENCOUNTER — Emergency Department (HOSPITAL_COMMUNITY)
Admission: EM | Admit: 2023-09-19 | Discharge: 2023-09-19 | Disposition: A | Discharge status: Home / Self Care | Payer: Self-pay | Attending: Emergency Medicine | Admitting: Emergency Medicine

## 2023-09-19 DIAGNOSIS — Z20822 Contact with and (suspected) exposure to covid-19: Secondary | ICD-10-CM | POA: Insufficient documentation

## 2023-09-19 DIAGNOSIS — R519 Headache, unspecified: Secondary | ICD-10-CM | POA: Insufficient documentation

## 2023-09-19 DIAGNOSIS — R079 Chest pain, unspecified: Secondary | ICD-10-CM | POA: Insufficient documentation

## 2023-09-19 LAB — BASIC METABOLIC PANEL
Anion gap: 8 (ref 5–15)
BUN: 9 mg/dL (ref 6–20)
CO2: 23 mmol/L (ref 22–32)
Calcium: 9.4 mg/dL (ref 8.9–10.3)
Chloride: 107 mmol/L (ref 98–111)
Creatinine, Ser: 0.41 mg/dL — ABNORMAL LOW (ref 0.44–1.00)
GFR, Estimated: >60 mL/min (ref >60)
Glucose, Bld: 176 mg/dL — ABNORMAL HIGH (ref 70–99)
Potassium: 3.5 mmol/L (ref 3.5–5.1)
Sodium: 138 mmol/L (ref 135–145)

## 2023-09-19 LAB — CBC WITH DIFFERENTIAL/PLATELET
Abs Immature Granulocytes: 0.01 10*3/uL (ref 0.00–0.07)
Basophils Absolute: 0 10*3/uL (ref 0.0–0.1)
Basophils Relative: 0 %
Eosinophils Absolute: 0.1 10*3/uL (ref 0.0–0.5)
Eosinophils Relative: 2 %
HCT: 39.4 % (ref 36.0–46.0)
Hemoglobin: 13.6 g/dL (ref 12.0–15.0)
Immature Granulocytes: 0 %
Lymphocytes Relative: 39 %
Lymphs Abs: 1.9 10*3/uL (ref 0.7–4.0)
MCH: 31.6 pg (ref 26.0–34.0)
MCHC: 34.5 g/dL (ref 30.0–36.0)
MCV: 91.6 fL (ref 80.0–100.0)
Monocytes Absolute: 0.3 10*3/uL (ref 0.1–1.0)
Monocytes Relative: 6 %
Neutro Abs: 2.7 10*3/uL (ref 1.7–7.7)
Neutrophils Relative %: 53 %
Platelets: 255 10*3/uL (ref 150–400)
RBC: 4.3 MIL/uL (ref 3.87–5.11)
RDW: 12.1 % (ref 11.5–15.5)
WBC: 5 10*3/uL (ref 4.0–10.5)
nRBC: 0 % (ref 0.0–0.2)

## 2023-09-19 LAB — RESP PANEL BY RT-PCR (RSV, FLU A&B, COVID)  RVPGX2
Influenza A by PCR: NEGATIVE
Influenza B by PCR: NEGATIVE
Resp Syncytial Virus by PCR: NEGATIVE
SARS Coronavirus 2 by RT PCR: NEGATIVE

## 2023-09-19 LAB — HCG, SERUM, QUALITATIVE: Preg, Serum: NEGATIVE

## 2023-09-19 LAB — I-STAT CG4 LACTIC ACID, ED
Lactic Acid, Venous: 1.1 mmol/L (ref 0.5–1.9)
Lactic Acid, Venous: 1.2 mmol/L (ref 0.5–1.9)

## 2023-09-19 LAB — TROPONIN I (HIGH SENSITIVITY)
Troponin I (High Sensitivity): <2 ng/L (ref <18)
Troponin I (High Sensitivity): <2 ng/L (ref <18)

## 2023-09-19 MED ORDER — LACTATED RINGERS IV BOLUS
1000.0000 mL | Freq: Once | INTRAVENOUS | Status: AC
  Administered 2023-09-19: 1000 mL via INTRAVENOUS

## 2023-09-19 MED ORDER — KETOROLAC TROMETHAMINE 15 MG/ML IJ SOLN
15.0000 mg | Freq: Once | INTRAMUSCULAR | Status: AC
  Administered 2023-09-19: 15 mg via INTRAMUSCULAR
  Filled 2023-09-19: qty 1

## 2023-09-19 MED ORDER — ACETAMINOPHEN 500 MG PO TABS
1000.0000 mg | ORAL_TABLET | Freq: Once | ORAL | Status: AC
  Administered 2023-09-19: 1000 mg via ORAL
  Filled 2023-09-19: qty 2

## 2023-09-19 NOTE — ED Triage Notes (Signed)
 Pt reports pain to right side of chest since 6 this morning. Pain radiates down right arm. Denies injury. Pain worse with palpation.

## 2023-09-19 NOTE — Telephone Encounter (Signed)
  Chief Complaint: chest pain Symptoms: stabbing pain, blurred vision Frequency: na Disposition: [x] ED /[] Urgent Care (no appt availability in office) / [] Appointment(In office/virtual)/ []  [facility]/ [] Home Care/ [] Refused Recommended Disposition /[] [place] [facility]/ []  Follow-up with PCP Additional Notes: Pt called saying she was having active chest pain and pain is getting worse. Pt stated she lost vision two days ago, but can see now. Feels like blood sugar may be low. RN did not further assess. Told pt 911 needed to be called right now. Pt stated but its on the right side. Pt hesitantly said she would call.  RN shared heart attacks can present differently in women and she needed to call 911. Pt verbalized ok         Copied from CRM [id number]. Topic: Clinical - Red Word Triage >> Sep 19, 2023  1:45 PM Drema MATSU wrote: [facility] that prompted transfer to Nurse Triage: Patient is having blurry vision, chest pains, and thinks her blood sugar is low. Patient as has not checked it today but does not feel good.

## 2023-09-19 NOTE — ED Provider Notes (Signed)
 [place] EMERGENCY DEPARTMENT AT [facility] Provider Note   CSN: [id number] Arrival date & time: 09/19/23  1547     History Chief Complaint  Patient presents with   Chest Pain    HPI Sylvia Best is a 43 y.o. female presenting for a myriad of complaints.  Fever cough congestion fatigue malaise.  She states that she is having a substantial amount of chest pain today as well as a worsening headache.  She had episodes of visual symptoms similar to prior headaches that occurred 2 days ago but now getting better.  Endorses malaise fatigue myalgias as well.  Patient's recorded medical, surgical, social, medication list and allergies were reviewed in the Snapshot window as part of the initial history.   Review of Systems   Review of Systems  Constitutional:  Positive for fatigue and fever. Negative for chills.  HENT:  Negative for ear pain and sore throat.   Eyes:  Negative for pain and visual disturbance.  Respiratory:  Negative for cough and shortness of breath.   Cardiovascular:  Positive for chest pain. Negative for palpitations.  Gastrointestinal:  Negative for abdominal pain and vomiting.  Genitourinary:  Negative for dysuria and hematuria.  Musculoskeletal:  Positive for myalgias. Negative for arthralgias and back pain.  Skin:  Negative for color change and rash.  Neurological:  Negative for seizures and syncope.  All other systems reviewed and are negative.   Physical Exam Updated Vital Signs BP 122/77   Pulse 81   Temp 99.9 F (37.7 C) (Rectal)   Resp 18   Ht 5' 6 (1.676 m)   Wt 58 kg   SpO2 100%   BMI 20.64 kg/m  Physical Exam Vitals and nursing note reviewed.  Constitutional:      General: She is not in acute distress.    Appearance: She is well-developed.  HENT:     Head: Normocephalic and atraumatic.  Eyes:     Conjunctiva/sclera: Conjunctivae normal.  Cardiovascular:     Rate and Rhythm: Normal rate and regular rhythm.     Heart sounds: No  murmur heard. Pulmonary:     Effort: Pulmonary effort is normal. No respiratory distress.     Breath sounds: Normal breath sounds.  Abdominal:     General: There is no distension.     Palpations: Abdomen is soft.     Tenderness: There is no abdominal tenderness. There is no right CVA tenderness or left CVA tenderness.  Musculoskeletal:        General: No swelling or tenderness. Normal range of motion.     Cervical back: Neck supple.  Skin:    General: Skin is warm and dry.  Neurological:     General: No focal deficit present.     Mental Status: She is alert and oriented to person, place, and time. Mental status is at baseline.     Cranial Nerves: No cranial nerve deficit.      ED Course/ Medical Decision Making/ A&P    Procedures Procedures   Medications Ordered in ED Medications  acetaminophen  (TYLENOL ) tablet 1,000 mg (1,000 mg Oral Given 09/19/23 1845)  ketorolac  (TORADOL ) 15 MG/ML injection 15 mg (15 mg Intramuscular Given 09/19/23 1840)  lactated ringers  bolus 1,000 mL (1,000 mLs Intravenous New Bag/Given 09/19/23 1911)   Medical Decision Making:   Sylvia Best is a 43 y.o. female who presented to the ED today with subjective fever, cough, congestion detailed above.    Patient placed on continuous vitals and  telemetry monitoring while in ED which was reviewed periodically.   Complete initial physical exam performed, notably the patient  was hemodynamically stable in no acute distress.  Posterior oropharynx illuminated and without obvious swelling or deformity.  Patient is without neck stiffness.    Reviewed and confirmed nursing documentation for past medical history, family history, social history.    Initial Assessment:   With the patient's presentation of fever cough congestion, most likely diagnosis is developing viral upper respiratory infection. Other diagnoses were considered including (but not limited to) peritonsillar abscess, retropharyngeal abscess, pneumonia. These  are considered less likely due to history of present illness and physical exam findings.   This is most consistent with an acute life/limb threatening illness complicated by underlying chronic conditions. Considered meningitis, however patient's symptoms, vital signs, physical exam findings including lack of meningismus seem grossly less consistent at this time. Initial Plan:  CT H due to worsening headache  Screening labs including CBC and Metabolic panel to evaluate for infectious or metabolic etiology of disease.  Viral screening including COVID/flu testing to evaluate for common viral etiologies that need to be tracked CXR to evaluate for structural/infectious intrathoracic pathology.  Empiric treatment with antipyretics including acetaminophen  in ambulatory setting Will augment with dose of ketorolac  in ED  Objective evaluation as below reviewed   Initial Study Results:   Laboratory  All laboratory results reviewed without evidence of clinically relevant pathology.    Radiology:  All images reviewed independently. Agree with radiology report at this time.   DG Chest Portable 1 View  Final Result    CT Head Wo Contrast  Final Result         Final Assessment and Plan:   On reassessment, patient is ambulatory tolerating p.o. intake in no acute distress.   Patient's COVID test is negative.  Patient is currently stable for outpatient care and management with no indication for hospitalization or transfer at this time.  Discussed all findings with patient expressed understanding.  Disposition:  Based on the above findings, I believe patient is stable for discharge.    Patient/family educated about specific return precautions for given chief complaint and symptoms.  Patient/family educated about follow-up with PCP.     Patient/family expressed understanding of return precautions and need for follow-up. Patient spoken to regarding all imaging and laboratory results and appropriate  follow up for these results. All education provided in verbal form with additional information in written form. Time was allowed for answering of patient questions. Patient discharged.    Emergency Department Medication Summary:   Medications  acetaminophen  (TYLENOL ) tablet 1,000 mg (1,000 mg Oral Given 09/19/23 1845)  ketorolac  (TORADOL ) 15 MG/ML injection 15 mg (15 mg Intramuscular Given 09/19/23 1840)  lactated ringers  bolus 1,000 mL (1,000 mLs Intravenous New Bag/Given 09/19/23 1911)     Clinical Impression:  1. Nonspecific chest pain      Discharge   Final Clinical Impression(s) / ED Diagnoses Final diagnoses:  Nonspecific chest pain    Rx / DC Orders ED Discharge Orders     None         Jerral Meth, MD 09/19/23 2138

## 2023-09-19 NOTE — ED Provider Triage Note (Signed)
 Emergency Medicine Provider Triage Evaluation Note  Sylvia Best , a 43 y.o. female  was evaluated in triage.  Pt complains of right sided chest pain that started this morning.  Chest pain worse with movement of right upper extremity, palpation, and deep inspiration.  No history of blood clots.  No lower extremity edema.  Also admits to a headache.  Notes her vision went away 2 days ago and resolved after a few minutes.  No current visual or speech changes.  Review of Systems  Positive: CP, headache Negative: fever  Physical Exam  BP (!) 127/97   Pulse (!) 101   Temp 99.1 F (37.3 C) (Oral)   Resp 20   Ht 5' 6 (1.676 m)   Wt 58 kg   SpO2 100%   BMI 20.64 kg/m  Gen:   Awake, no distress   Resp:  Normal effort  MSK:   Moves extremities without difficulty  Other:  Reproducible right sided chest pain, no facial droop, normal speech  Medical Decision Making  Medically screening exam initiated at 4:23 PM.  Appropriate orders placed.  Sylvia Best was informed that the remainder of the evaluation will be completed by another provider, this initial triage assessment does not replace that evaluation, and the importance of remaining in the ED until their evaluation is complete.   Labs CT head due to recent visual changes and headache   Sylvia Aleck BROCKS, PA-C 09/19/23 1625
